# Patient Record
Sex: Female | Born: 1955 | Race: Black or African American | Hispanic: No | Marital: Single | Smoking: Never smoker
Health system: Southern US, Community
[De-identification: ages and names within clinical notes are randomized; demographics above are authoritative.]

## PROBLEM LIST (undated history)

## (undated) ENCOUNTER — Emergency Department (HOSPITAL_COMMUNITY): Admission: EM | Payer: Medicare HMO | Source: Home / Self Care

## (undated) DIAGNOSIS — E119 Type 2 diabetes mellitus without complications: Secondary | ICD-10-CM

## (undated) DIAGNOSIS — Z9889 Other specified postprocedural states: Secondary | ICD-10-CM

## (undated) DIAGNOSIS — M24661 Ankylosis, right knee: Secondary | ICD-10-CM

## (undated) DIAGNOSIS — M792 Neuralgia and neuritis, unspecified: Secondary | ICD-10-CM

## (undated) DIAGNOSIS — M1990 Unspecified osteoarthritis, unspecified site: Secondary | ICD-10-CM

## (undated) DIAGNOSIS — K08109 Complete loss of teeth, unspecified cause, unspecified class: Secondary | ICD-10-CM

## (undated) DIAGNOSIS — R112 Nausea with vomiting, unspecified: Secondary | ICD-10-CM

## (undated) DIAGNOSIS — I10 Essential (primary) hypertension: Secondary | ICD-10-CM

## (undated) DIAGNOSIS — Z9114 Patient's other noncompliance with medication regimen: Secondary | ICD-10-CM

## (undated) DIAGNOSIS — G629 Polyneuropathy, unspecified: Secondary | ICD-10-CM

## (undated) DIAGNOSIS — Z91148 Patient's other noncompliance with medication regimen for other reason: Secondary | ICD-10-CM

## (undated) DIAGNOSIS — E785 Hyperlipidemia, unspecified: Secondary | ICD-10-CM

## (undated) DIAGNOSIS — G8929 Other chronic pain: Secondary | ICD-10-CM

## (undated) DIAGNOSIS — Z9289 Personal history of other medical treatment: Secondary | ICD-10-CM

## (undated) HISTORY — DX: Patient's other noncompliance with medication regimen: Z91.14

## (undated) HISTORY — PX: TUBAL LIGATION: SHX77

## (undated) HISTORY — DX: Patient's other noncompliance with medication regimen for other reason: Z91.148

## (undated) HISTORY — PX: ROTATOR CUFF REPAIR: SHX139

## (undated) HISTORY — DX: Essential (primary) hypertension: I10

---

## 1998-02-04 ENCOUNTER — Encounter: Admission: RE | Admit: 1998-02-04 | Discharge: 1998-05-05 | Payer: Self-pay | Admitting: *Deleted

## 1998-02-06 ENCOUNTER — Other Ambulatory Visit: Admission: RE | Admit: 1998-02-06 | Discharge: 1998-02-06 | Payer: Self-pay | Admitting: Internal Medicine

## 1998-03-09 ENCOUNTER — Other Ambulatory Visit: Admission: RE | Admit: 1998-03-09 | Discharge: 1998-03-09 | Payer: Self-pay

## 1998-09-24 ENCOUNTER — Encounter: Payer: Self-pay | Admitting: Orthopedic Surgery

## 1998-09-28 ENCOUNTER — Observation Stay (HOSPITAL_COMMUNITY): Admission: RE | Admit: 1998-09-28 | Discharge: 1998-09-29 | Payer: Self-pay | Admitting: Orthopedic Surgery

## 1999-03-03 ENCOUNTER — Other Ambulatory Visit: Admission: RE | Admit: 1999-03-03 | Discharge: 1999-03-03 | Payer: Self-pay | Admitting: Internal Medicine

## 1999-04-07 ENCOUNTER — Encounter: Payer: Self-pay | Admitting: Internal Medicine

## 1999-04-07 ENCOUNTER — Ambulatory Visit (HOSPITAL_COMMUNITY): Admission: RE | Admit: 1999-04-07 | Discharge: 1999-04-07 | Payer: Self-pay | Admitting: Internal Medicine

## 2002-02-26 ENCOUNTER — Ambulatory Visit (HOSPITAL_COMMUNITY): Admission: RE | Admit: 2002-02-26 | Discharge: 2002-02-26 | Payer: Self-pay | Admitting: Gastroenterology

## 2002-02-26 ENCOUNTER — Encounter: Payer: Self-pay | Admitting: Gastroenterology

## 2002-03-08 ENCOUNTER — Ambulatory Visit (HOSPITAL_COMMUNITY): Admission: RE | Admit: 2002-03-08 | Discharge: 2002-03-08 | Payer: Self-pay | Admitting: Gastroenterology

## 2002-03-08 ENCOUNTER — Encounter: Payer: Self-pay | Admitting: Gastroenterology

## 2004-08-06 ENCOUNTER — Other Ambulatory Visit: Admission: RE | Admit: 2004-08-06 | Discharge: 2004-08-06 | Payer: Self-pay | Admitting: Obstetrics and Gynecology

## 2010-07-23 ENCOUNTER — Ambulatory Visit: Payer: Self-pay | Admitting: Cardiology

## 2010-07-29 ENCOUNTER — Encounter: Payer: Self-pay | Admitting: Cardiology

## 2010-07-29 DIAGNOSIS — I10 Essential (primary) hypertension: Secondary | ICD-10-CM

## 2010-07-30 ENCOUNTER — Encounter: Payer: Self-pay | Admitting: Cardiology

## 2010-07-30 ENCOUNTER — Ambulatory Visit: Payer: Self-pay

## 2010-08-27 ENCOUNTER — Ambulatory Visit: Payer: Self-pay | Admitting: Cardiology

## 2010-09-30 ENCOUNTER — Ambulatory Visit: Payer: Self-pay | Admitting: Cardiology

## 2010-11-23 NOTE — Miscellaneous (Signed)
Summary: Orders Update  Clinical Lists Changes  Problems: Added new problem of UNSPECIFIED ESSENTIAL HYPERTENSION (ICD-401.9) Orders: Added new Test order of Renal Artery Duplex (Renal Artery Duplex) - Signed

## 2011-01-21 ENCOUNTER — Ambulatory Visit: Payer: Self-pay | Admitting: Cardiology

## 2011-01-27 ENCOUNTER — Encounter: Payer: Self-pay | Admitting: Nurse Practitioner

## 2011-02-01 ENCOUNTER — Encounter: Payer: Self-pay | Admitting: Nurse Practitioner

## 2011-02-01 ENCOUNTER — Ambulatory Visit (INDEPENDENT_AMBULATORY_CARE_PROVIDER_SITE_OTHER): Payer: Managed Care, Other (non HMO) | Admitting: Nurse Practitioner

## 2011-02-01 VITALS — BP 170/100 | HR 60 | Ht 63.0 in | Wt 157.8 lb

## 2011-02-01 DIAGNOSIS — I10 Essential (primary) hypertension: Secondary | ICD-10-CM

## 2011-02-01 DIAGNOSIS — Z91148 Patient's other noncompliance with medication regimen for other reason: Secondary | ICD-10-CM | POA: Insufficient documentation

## 2011-02-01 DIAGNOSIS — E1159 Type 2 diabetes mellitus with other circulatory complications: Secondary | ICD-10-CM | POA: Insufficient documentation

## 2011-02-01 DIAGNOSIS — Z9119 Patient's noncompliance with other medical treatment and regimen: Secondary | ICD-10-CM

## 2011-02-01 DIAGNOSIS — I152 Hypertension secondary to endocrine disorders: Secondary | ICD-10-CM | POA: Insufficient documentation

## 2011-02-01 DIAGNOSIS — Z9114 Patient's other noncompliance with medication regimen: Secondary | ICD-10-CM

## 2011-02-01 MED ORDER — CARVEDILOL 6.25 MG PO TABS: 6.2500 mg | ORAL_TABLET | Freq: Two times a day (BID) | ORAL | Status: DC

## 2011-02-01 MED ORDER — HYDRALAZINE HCL 25 MG PO TABS: 25.0000 mg | ORAL_TABLET | Freq: Two times a day (BID) | ORAL | Status: DC

## 2011-02-01 NOTE — Patient Instructions (Signed)
Stop the Bystolic Stop the Clonidine  Start Coreg 6.25mg  2 x a day Stay on Hydralazine 25mg  2 x a day Stay on Tribenzor 2 x a day See me Tuesday for a visit. Watch your salt.

## 2011-02-01 NOTE — Progress Notes (Signed)
History of Present Illness: Lori Bennett is seen today for her follow up visit. She is seen for Dr. Swaziland. She has been out of her Bystolic and hydralazine. She cannot afford the medicines with her current drug coverage. Blood pressure is grossly elevated today. She is fairly asymptomatic. Her eyes are always a little blurry. She denies headache. She is not having chest pain.   Current Outpatient Prescriptions on File Prior to Visit  Medication Sig Dispense Refill  . Olmesartan-Amlodipine-HCTZ (TRIBENZOR) 40-5-12.5 MG TABS Take by mouth 2 (two) times daily.       Marland Kitchen DISCONTD: nebivolol (BYSTOLIC) 10 MG tablet Take 10 mg by mouth daily.        . carvedilol (COREG) 6.25 MG tablet Take 1 tablet (6.25 mg total) by mouth 2 (two) times daily.  60 tablet  11  . DISCONTD: cloNIDine (CATAPRES) 0.1 MG tablet Take 0.1 mg by mouth daily.        Marland Kitchen DISCONTD: hydrALAZINE (APRESOLINE) 25 MG tablet Take 25 mg by mouth 2 (two) times daily.          Allergies  Allergen Reactions  . Ace Inhibitors Cough  . Clonidine Derivatives Other (See Comments)    Severe dry mouth and lethargy.    Past Medical History  Diagnosis Date  . HTN (hypertension)   . Noncompliance with medications     Past Surgical History  Procedure Date  . Rotator cuff repair   . Tubal ligation     History  Smoking status  . Never Smoker   Smokeless tobacco  . Never Used    History  Alcohol Use No    Family History  Problem Relation Age of Onset  . Hypertension Mother     Review of Systems: The review of systems is positive for blurred vision. She is not short of breath. She tries to watch her salt.  All other systems were reviewed and are negative.  Physical Exam: BP 170/100  Pulse 60  Ht 5\' 3"  (1.6 m)  Wt 157 lb 12.8 oz (71.578 kg)  BMI 27.95 kg/m2 Patient is very pleasant and in no acute distress. Skin is warm and dry. Color is normal.  HEENT is remarkable for poor dentition. Normocephalic/atraumatic. PERRL. Sclera are  nonicteric. Neck is supple. No masses. No JVD. Lungs are clear. Cardiac exam shows a regular rate and rhythm. She has a positive S4. Abdomen is soft. Extremities are without edema. Gait and ROM are intact. No gross neurologic deficits noted.    Assessment / Plan:

## 2011-02-01 NOTE — Assessment & Plan Note (Addendum)
Blood pressure is quite elevated today. She has been out of the Bystolic and Hydralazine for quite some time. She does not tolerate the clonidine. Cost is the significant factor with her current drug coverage. We have samples available but I do not think that is a good long term solution for her and she agrees. She states she can afford $4 drugs at [facility]. I have switched her over to Coreg 6.25mg  BID. The hydralazine is $4 as well at [facility]. We will restart it at 25mg  BID.  New prescriptions are eprescribed to the [facility] on [address]. I will see her back on Tuesday. We will need to find an alternative for the Tribenzor but samples are given of the Tribenzor for her today.

## 2011-02-07 ENCOUNTER — Encounter: Payer: Self-pay | Admitting: *Deleted

## 2011-02-09 ENCOUNTER — Ambulatory Visit (INDEPENDENT_AMBULATORY_CARE_PROVIDER_SITE_OTHER): Payer: Managed Care, Other (non HMO) | Admitting: Nurse Practitioner

## 2011-02-09 ENCOUNTER — Encounter: Payer: Self-pay | Admitting: Nurse Practitioner

## 2011-02-09 VITALS — BP 134/86 | HR 64 | Wt 159.0 lb

## 2011-02-09 DIAGNOSIS — Z9119 Patient's noncompliance with other medical treatment and regimen: Secondary | ICD-10-CM

## 2011-02-09 DIAGNOSIS — Z91148 Patient's other noncompliance with medication regimen for other reason: Secondary | ICD-10-CM

## 2011-02-09 DIAGNOSIS — I10 Essential (primary) hypertension: Secondary | ICD-10-CM

## 2011-02-09 DIAGNOSIS — Z9114 Patient's other noncompliance with medication regimen: Secondary | ICD-10-CM

## 2011-02-09 NOTE — Patient Instructions (Signed)
Continue with your current medicines. Monitor your blood pressure at home.  Record your readings and bring to your next visit. Limit sodium intake. Call for any problems.  

## 2011-02-09 NOTE — Assessment & Plan Note (Signed)
Blood pressure is much better on this current regimen. This is a regimen that she can afford. She has a copay card to offset the cost for the Tribenzor. Samples of Tribenzor are given as well. I will see her back in about 3 months. She will continue to monitor her blood pressure at home. She will call for any problems in the interim.

## 2011-02-09 NOTE — Progress Notes (Signed)
   Lori Bennett Date of Birth: Jun 02, 1956   History of Present Illness: Lori Bennett is seen back today for a follow up visit. She is seen for Dr. Swaziland. She was not able to afford her medicines. She was switched to generic Coreg and hydralazine because they are only $4.00. Her blood pressure at home is much better. It is better here today as well. She is feeling better. She is not having chest pain. She is now on a regimen that she can afford.   Current Outpatient Prescriptions on File Prior to Visit  Medication Sig Dispense Refill  . carvedilol (COREG) 6.25 MG tablet Take 1 tablet (6.25 mg total) by mouth 2 (two) times daily.  60 tablet  11  . hydrALAZINE (APRESOLINE) 25 MG tablet Take 1 tablet (25 mg total) by mouth 2 (two) times daily.  60 tablet  6  . Olmesartan-Amlodipine-HCTZ (TRIBENZOR) 40-5-12.5 MG TABS Take by mouth 2 (two) times daily.         Allergies  Allergen Reactions  . Ace Inhibitors Cough  . Clonidine Derivatives Other (See Comments)    Severe dry mouth and lethargy.    Past Medical History  Diagnosis Date  . HTN (hypertension)   . Noncompliance with medications     Past Surgical History  Procedure Date  . Rotator cuff repair   . Tubal ligation     History  Smoking status  . Never Smoker   Smokeless tobacco  . Never Used    History  Alcohol Use No    Family History  Problem Relation Age of Onset  . Hypertension Mother     Review of Systems: The review of systems is as above.  All other systems were reviewed and are negative.  Physical Exam: BP 134/86  Pulse 64  Wt 159 lb (72.122 kg) Patient is very pleasant and in no acute distress. Skin is warm and dry. Color is normal.  HEENT is unremarkable. Normocephalic/atraumatic. PERRL. Sclera are nonicteric. Neck is supple. No masses. No JVD. Lungs are clear. Cardiac exam shows a regular rate and rhythm. Abdomen is soft. Extremities are without edema. Gait and ROM are intact. No gross neurologic deficits  noted.  LABORATORY DATA:  N/A   Assessment / Plan:

## 2011-02-09 NOTE — Assessment & Plan Note (Signed)
She is currently on a regimen that is affordable. We will continue with this current regimen.

## 2011-05-13 ENCOUNTER — Ambulatory Visit (INDEPENDENT_AMBULATORY_CARE_PROVIDER_SITE_OTHER): Payer: Managed Care, Other (non HMO) | Admitting: Nurse Practitioner

## 2011-05-13 ENCOUNTER — Encounter: Payer: Self-pay | Admitting: Nurse Practitioner

## 2011-05-13 VITALS — BP 180/108 | HR 70 | Ht 62.0 in | Wt 154.0 lb

## 2011-05-13 DIAGNOSIS — I10 Essential (primary) hypertension: Secondary | ICD-10-CM

## 2011-05-13 MED ORDER — OLMESARTAN-AMLODIPINE-HCTZ 40-5-12.5 MG PO TABS: ORAL_TABLET | ORAL | Status: DC

## 2011-05-13 NOTE — Assessment & Plan Note (Signed)
Blood pressure is grossly elevated. She has been out of the Tribenzor for one month. We will restart. Samples, co-pay card and prescription are given today. She will continue with her other medicines. I will see her back in one month to recheck her. Compliance is strongly encouraged. Patient is agreeable to this plan and will call if any problems develop in the interim.

## 2011-05-13 NOTE — Patient Instructions (Signed)
Get back on Tribenzor twice a day I need to see you in about a month Monitor your blood pressure at home Call for samples or refills if you run out of your medicines. Our goal is to not run out of any of your medicines. Call for any problems

## 2011-05-13 NOTE — Progress Notes (Signed)
    Lori Bennett Date of Birth: 1956/01/01   History of Present Illness: Lori Bennett is seen today for her 3 month follow up visit. She is seen for Dr. Swaziland. She has significant HTN. Unfortunately, she has been out of her Tribenzor for the past month. She did not take it to get refilled. Blood pressure is back up. She feels ok. No chest pain.   Current Outpatient Prescriptions on File Prior to Visit  Medication Sig Dispense Refill  . carvedilol (COREG) 6.25 MG tablet Take 1 tablet (6.25 mg total) by mouth 2 (two) times daily.  60 tablet  11  . hydrALAZINE (APRESOLINE) 25 MG tablet Take 1 tablet (25 mg total) by mouth 2 (two) times daily.  60 tablet  6    Allergies  Allergen Reactions  . Ace Inhibitors Cough  . Clonidine Derivatives Other (See Comments)    Severe dry mouth and lethargy.    Past Medical History  Diagnosis Date  . HTN (hypertension)   . Noncompliance with medications     Past Surgical History  Procedure Date  . Rotator cuff repair   . Tubal ligation     History  Smoking status  . Never Smoker   Smokeless tobacco  . Never Used    History  Alcohol Use No    Family History  Problem Relation Age of Onset  . Hypertension Mother     Review of Systems: The review of systems is positive for elevated blood pressure.  All other systems were reviewed and are negative.  Physical Exam: BP 180/108  Pulse 70  Ht 5\' 2"  (1.575 m)  Wt 154 lb (69.854 kg)  BMI 28.17 kg/m2 Patient is very pleasant and in no acute distress. Skin is warm and dry. Color is normal.  HEENT is unremarkable. Normocephalic/atraumatic. PERRL. Sclera are nonicteric. Neck is supple. No masses. No JVD. Lungs are clear. Cardiac exam shows a regular rate and rhythm. Abdomen is soft. Extremities are without edema. Gait and ROM are intact. No gross neurologic deficits noted.  LABORATORY DATA:   Assessment / Plan:

## 2011-06-17 ENCOUNTER — Encounter: Payer: Self-pay | Admitting: Nurse Practitioner

## 2011-06-17 ENCOUNTER — Ambulatory Visit (INDEPENDENT_AMBULATORY_CARE_PROVIDER_SITE_OTHER): Payer: Managed Care, Other (non HMO) | Admitting: Nurse Practitioner

## 2011-06-17 VITALS — BP 130/80 | Ht 63.0 in | Wt 154.4 lb

## 2011-06-17 DIAGNOSIS — I10 Essential (primary) hypertension: Secondary | ICD-10-CM

## 2011-06-17 NOTE — Patient Instructions (Signed)
Stay on your medicines Call us if you need samples I will have you see Dr. Swaziland in 6 months.

## 2011-06-17 NOTE — Assessment & Plan Note (Signed)
Blood pressure is better. I have left her on her current regimen. I have encouraged her to stay compliant. She is on an affordable regimen at this time with the co pay cards. We will see her back in 6 months. Patient is agreeable to this plan and will call if any problems develop in the interim.

## 2011-06-17 NOTE — Progress Notes (Signed)
    Lori Bennett Date of Birth: 14-Oct-1956   History of Present Illness: Lori Bennett is seen today for a follow up visit. She is seen back for Dr. Swaziland. She is back on her medicines. She feels good. No complaints.  Current Outpatient Prescriptions on File Prior to Visit  Medication Sig Dispense Refill  . carvedilol (COREG) 6.25 MG tablet Take 1 tablet (6.25 mg total) by mouth 2 (two) times daily.  60 tablet  11  . hydrALAZINE (APRESOLINE) 25 MG tablet Take 1 tablet (25 mg total) by mouth 2 (two) times daily.  60 tablet  6  . Olmesartan-Amlodipine-HCTZ (TRIBENZOR) 40-5-12.5 MG TABS Take BID  60 tablet  6    Allergies  Allergen Reactions  . Ace Inhibitors Cough  . Clonidine Derivatives Other (See Comments)    Severe dry mouth and lethargy.    Past Medical History  Diagnosis Date  . HTN (hypertension)   . Noncompliance with medications     Past Surgical History  Procedure Date  . Rotator cuff repair   . Tubal ligation     History  Smoking status  . Never Smoker   Smokeless tobacco  . Never Used    History  Alcohol Use No    Family History  Problem Relation Age of Onset  . Hypertension Mother     Review of Systems: The review of systems is as above.  All other systems were reviewed and are negative.  Physical Exam: BP 130/80  Ht 5\' 3"  (1.6 m)  Wt 154 lb 6.4 oz (70.035 kg)  BMI 27.35 kg/m2 Patient is very pleasant and in no acute distress. Skin is warm and dry. Color is normal.  HEENT is unremarkable. Normocephalic/atraumatic. PERRL. Sclera are nonicteric. Neck is supple. No masses. No JVD. Lungs are clear. Cardiac exam shows a regular rate and rhythm. Abdomen is soft. Extremities are without edema. Gait and ROM are intact. No gross neurologic deficits noted.  LABORATORY DATA:   Assessment / Plan:

## 2011-12-15 ENCOUNTER — Ambulatory Visit (INDEPENDENT_AMBULATORY_CARE_PROVIDER_SITE_OTHER): Payer: Managed Care, Other (non HMO) | Admitting: Cardiology

## 2011-12-15 ENCOUNTER — Encounter: Payer: Self-pay | Admitting: Cardiology

## 2011-12-15 VITALS — BP 130/78 | HR 69 | Ht 63.0 in | Wt 158.6 lb

## 2011-12-15 DIAGNOSIS — I10 Essential (primary) hypertension: Secondary | ICD-10-CM

## 2011-12-15 MED ORDER — LOSARTAN POTASSIUM-HCTZ 50-12.5 MG PO TABS: 1.0000 | ORAL_TABLET | Freq: Two times a day (BID) | ORAL | Status: DC

## 2011-12-15 MED ORDER — AMLODIPINE BESYLATE 5 MG PO TABS: 5.0000 mg | ORAL_TABLET | Freq: Two times a day (BID) | ORAL | Status: DC

## 2011-12-15 NOTE — Patient Instructions (Signed)
We will switch your Tribenzor to amlodipine 5 mg twice a day and Hyzaar (losartan HCT) 50/12.5 mg twice a day.  Monitor your blood pressure and let me know if you see a change.  We will check your kidney function today.

## 2011-12-15 NOTE — Assessment & Plan Note (Signed)
Her blood pressure is under excellent control on current medications. Given her difficulty affording theTribenzor we will switch her to Hyzaar 50/12.5 mg twice daily and amlodipine 5 mg twice daily. She will continue with her carvedilol and hydralazine. She is going to monitor her blood pressure closely. We will check a basic metabolic panel today. Although she has changes on her ECG I suspect this is related to her chronic hypertensive heart disease. She is asymptomatic.

## 2011-12-15 NOTE — Progress Notes (Signed)
    Lori Bennett Date of Birth: [birth year]   History of Present Illness: Lori Bennett is seen today for a follow up visit. She states she has been feeling well. She denies any chest pain or dyspnea. She has been tolerating her medications well. She states she is no longer able to afford the Tribenzor since even with a savings card it  costs $140 a month.  Current Outpatient Prescriptions on File Prior to Visit  Medication Sig Dispense Refill  . carvedilol (COREG) 6.25 MG tablet Take 1 tablet (6.25 mg total) by mouth 2 (two) times daily.  60 tablet  11  . hydrALAZINE (APRESOLINE) 25 MG tablet Take 1 tablet (25 mg total) by mouth 2 (two) times daily.  60 tablet  6    Allergies  Allergen Reactions  . Ace Inhibitors Cough  . Clonidine Derivatives Other (See Comments)    Severe dry mouth and lethargy.    Past Medical History  Diagnosis Date  . HTN (hypertension)   . Noncompliance with medications     Past Surgical History  Procedure Date  . Rotator cuff repair   . Tubal ligation     History  Smoking status  . Never Smoker   Smokeless tobacco  . Never Used    History  Alcohol Use No    Family History  Problem Relation Age of Onset  . Hypertension Mother     Review of Systems: The review of systems is as above.  All other systems were reviewed and are negative.  Physical Exam: BP 130/78  Pulse 69  Ht 5\' 3"  (1.6 m)  Wt 71.94 kg (158 lb 9.6 oz)  BMI 28.09 kg/m2 Patient is very pleasant and in no acute distress. Skin is warm and dry. Color is normal.  HEENT is unremarkable. Normocephalic/atraumatic. PERRL. Sclera are nonicteric. Neck is supple. No masses. No JVD. Lungs are clear. Cardiac exam shows a regular rate and rhythm. Abdomen is soft. Extremities are without edema. Gait and ROM are intact. No gross neurologic deficits noted.  LABORATORY DATA: ECG today demonstrates normal sinus rhythm. She has ST-T wave changes consistent with inferior and anterolateral ischemia.  Compared to prior ECGs and 2010 these changes are more prominent.  Assessment / Plan:

## 2011-12-16 ENCOUNTER — Other Ambulatory Visit: Payer: Self-pay

## 2011-12-16 ENCOUNTER — Telehealth: Payer: Self-pay | Admitting: Cardiology

## 2011-12-16 ENCOUNTER — Telehealth: Payer: Self-pay

## 2011-12-16 DIAGNOSIS — I10 Essential (primary) hypertension: Secondary | ICD-10-CM

## 2011-12-16 LAB — BASIC METABOLIC PANEL
BUN: 13 mg/dL (ref 6–23)
Calcium: 9.7 mg/dL (ref 8.4–10.5)
Chloride: 103 mEq/L (ref 96–112)
Creatinine, Ser: 0.7 mg/dL (ref 0.4–1.2)
GFR: 109.51 mL/min (ref >60.00)

## 2011-12-16 MED ORDER — POTASSIUM CHLORIDE CRYS ER 20 MEQ PO TBCR: 20.0000 meq | EXTENDED_RELEASE_TABLET | Freq: Two times a day (BID) | ORAL | Status: DC

## 2011-12-16 NOTE — Telephone Encounter (Signed)
Patient was called no answer at home. Left message to call back today.Work # was called left message for patient to call back today reguarding lab work.

## 2011-12-16 NOTE — Telephone Encounter (Signed)
Patient called was told potassum low at 2.8.Spoke to Norma Fredrickson NP advised to start kdur twice daily.Repeat bmet in 1 week.

## 2011-12-16 NOTE — Telephone Encounter (Signed)
Pt rtn call to [place] °

## 2011-12-19 ENCOUNTER — Other Ambulatory Visit: Payer: Self-pay

## 2011-12-19 DIAGNOSIS — I10 Essential (primary) hypertension: Secondary | ICD-10-CM

## 2011-12-23 ENCOUNTER — Other Ambulatory Visit (INDEPENDENT_AMBULATORY_CARE_PROVIDER_SITE_OTHER): Payer: Managed Care, Other (non HMO)

## 2011-12-23 ENCOUNTER — Other Ambulatory Visit: Payer: Self-pay

## 2011-12-23 DIAGNOSIS — I10 Essential (primary) hypertension: Secondary | ICD-10-CM

## 2011-12-23 LAB — BASIC METABOLIC PANEL
BUN: 15 mg/dL (ref 6–23)
CO2: 27 mEq/L (ref 19–32)
Calcium: 9.3 mg/dL (ref 8.4–10.5)
Chloride: 104 mEq/L (ref 96–112)
Creatinine, Ser: 0.7 mg/dL (ref 0.4–1.2)
GFR: 113.18 mL/min (ref >60.00)
Glucose, Bld: 116 mg/dL — ABNORMAL HIGH (ref 70–99)
Potassium: 3.4 mEq/L — ABNORMAL LOW (ref 3.5–5.1)
Sodium: 140 mEq/L (ref 135–145)

## 2012-01-09 ENCOUNTER — Other Ambulatory Visit (INDEPENDENT_AMBULATORY_CARE_PROVIDER_SITE_OTHER): Payer: Managed Care, Other (non HMO)

## 2012-01-09 DIAGNOSIS — I10 Essential (primary) hypertension: Secondary | ICD-10-CM

## 2012-01-09 LAB — BASIC METABOLIC PANEL
BUN: 10 mg/dL (ref 6–23)
Calcium: 9.4 mg/dL (ref 8.4–10.5)
Creatinine, Ser: 0.7 mg/dL (ref 0.4–1.2)
GFR: 117.07 mL/min (ref >60.00)
Glucose, Bld: 104 mg/dL — ABNORMAL HIGH (ref 70–99)
Potassium: 3.3 mEq/L — ABNORMAL LOW (ref 3.5–5.1)

## 2012-01-10 ENCOUNTER — Encounter: Payer: Self-pay | Admitting: *Deleted

## 2012-01-10 ENCOUNTER — Telehealth: Payer: Self-pay | Admitting: Cardiology

## 2012-01-10 NOTE — Telephone Encounter (Signed)
FU Call: Pt returning call to our office. Please return pt call to discuss further.  

## 2012-01-10 NOTE — Telephone Encounter (Signed)
Patient called no answer.LMTC. 

## 2012-01-10 NOTE — Telephone Encounter (Signed)
Patient returning nurse call, she can be reached at [phone]

## 2012-01-10 NOTE — Telephone Encounter (Signed)
Will forward to [place]

## 2012-01-11 ENCOUNTER — Other Ambulatory Visit: Payer: Self-pay

## 2012-01-11 DIAGNOSIS — I10 Essential (primary) hypertension: Secondary | ICD-10-CM

## 2012-01-11 NOTE — Telephone Encounter (Signed)
Patient called no answer.LMTC. 

## 2012-01-12 NOTE — Telephone Encounter (Signed)
Patient called, lab results given.Patient stated she was already taking kdur 20 meq 3 tablets.Spoke to Norma Fredrickson NP she advised to increase to 4 tablets of kdur  and repeat bmet 4 weeks.

## 2012-01-13 ENCOUNTER — Ambulatory Visit: Payer: Managed Care, Other (non HMO) | Admitting: Cardiology

## 2012-01-20 ENCOUNTER — Other Ambulatory Visit: Payer: Self-pay | Admitting: Nurse Practitioner

## 2012-02-10 ENCOUNTER — Telehealth: Payer: Self-pay | Admitting: *Deleted

## 2012-02-10 ENCOUNTER — Other Ambulatory Visit (INDEPENDENT_AMBULATORY_CARE_PROVIDER_SITE_OTHER): Payer: Managed Care, Other (non HMO)

## 2012-02-10 DIAGNOSIS — I10 Essential (primary) hypertension: Secondary | ICD-10-CM

## 2012-02-10 DIAGNOSIS — E876 Hypokalemia: Secondary | ICD-10-CM

## 2012-02-10 LAB — BASIC METABOLIC PANEL
BUN: 7 mg/dL (ref 6–23)
CO2: 26 mEq/L (ref 19–32)
Calcium: 8.7 mg/dL (ref 8.4–10.5)
Chloride: 107 mEq/L (ref 96–112)
Creatinine, Ser: 0.5 mg/dL (ref 0.4–1.2)
GFR: 150.11 mL/min (ref >60.00)
Glucose, Bld: 125 mg/dL — ABNORMAL HIGH (ref 70–99)
Potassium: 3 mEq/L — ABNORMAL LOW (ref 3.5–5.1)
Sodium: 141 mEq/L (ref 135–145)

## 2012-02-10 MED ORDER — POTASSIUM CHLORIDE CRYS ER 20 MEQ PO TBCR: 20.0000 meq | EXTENDED_RELEASE_TABLET | Freq: Two times a day (BID) | ORAL | Status: DC

## 2012-02-10 NOTE — Telephone Encounter (Signed)
Spoke with Dr Antoine Poche and he wanted patient to take an extra 80 meq of potassium today and go back to 20 meq twice daily.  Called patient and she has actually been out of potassium for about a week.  Advised to take 80 meq today, resume 20 meq twice a day and recheck bmet next week.  Verbalized understanding.  Did state her mother was not doing well a may pass away soon.

## 2012-02-15 ENCOUNTER — Other Ambulatory Visit: Payer: Managed Care, Other (non HMO)

## 2012-02-17 ENCOUNTER — Other Ambulatory Visit (INDEPENDENT_AMBULATORY_CARE_PROVIDER_SITE_OTHER): Payer: Managed Care, Other (non HMO)

## 2012-02-17 ENCOUNTER — Telehealth: Payer: Self-pay | Admitting: *Deleted

## 2012-02-17 DIAGNOSIS — E876 Hypokalemia: Secondary | ICD-10-CM

## 2012-02-17 LAB — BASIC METABOLIC PANEL
BUN: 8 mg/dL (ref 6–23)
CO2: 26 mEq/L (ref 19–32)
Calcium: 9 mg/dL (ref 8.4–10.5)
Chloride: 105 mEq/L (ref 96–112)
Creatinine, Ser: 0.6 mg/dL (ref 0.4–1.2)
GFR: 132.91 mL/min (ref >60.00)
Glucose, Bld: 109 mg/dL — ABNORMAL HIGH (ref 70–99)
Potassium: 3.4 mEq/L — ABNORMAL LOW (ref 3.5–5.1)
Sodium: 140 mEq/L (ref 135–145)

## 2012-02-17 NOTE — Telephone Encounter (Signed)
Pt aware of lab results and recommendations. She will increase potassium to qd. Return on 03/21/12 for bmp. Mylo Red RN

## 2012-02-17 NOTE — Telephone Encounter (Signed)
Message copied by Barrie Folk on Fri Feb 17, 2012  2:43 PM ------      Message from: Rosalio Macadamia      Created: Fri Feb 17, 2012 12:58 PM       Please report. Needs to increase her potassium to a total of 60 meq daily. Recheck BMET in one month. Patient of Dr. Elvis Coil.

## 2012-03-21 ENCOUNTER — Other Ambulatory Visit (INDEPENDENT_AMBULATORY_CARE_PROVIDER_SITE_OTHER): Payer: Managed Care, Other (non HMO)

## 2012-03-21 ENCOUNTER — Telehealth: Payer: Self-pay | Admitting: Cardiology

## 2012-03-21 ENCOUNTER — Telehealth: Payer: Self-pay

## 2012-03-21 DIAGNOSIS — E876 Hypokalemia: Secondary | ICD-10-CM

## 2012-03-21 LAB — BASIC METABOLIC PANEL
BUN: 16 mg/dL (ref 6–23)
CO2: 28 mEq/L (ref 19–32)
Calcium: 9.4 mg/dL (ref 8.4–10.5)
Chloride: 103 mEq/L (ref 96–112)
Creatinine, Ser: 0.8 mg/dL (ref 0.4–1.2)
GFR: 101.15 mL/min (ref >60.00)
Glucose, Bld: 135 mg/dL — ABNORMAL HIGH (ref 70–99)
Potassium: 3 mEq/L — ABNORMAL LOW (ref 3.5–5.1)
Sodium: 140 mEq/L (ref 135–145)

## 2012-03-21 MED ORDER — CARVEDILOL 6.25 MG PO TABS: 6.2500 mg | ORAL_TABLET | Freq: Two times a day (BID) | ORAL | Status: DC

## 2012-03-21 NOTE — Telephone Encounter (Signed)
Walk In pt Form " Pt Needs Refill" sent to [name] 03/21/12/Km

## 2012-03-21 NOTE — Telephone Encounter (Signed)
Patient walked in office requesting refill on carvedilol.Carvedilol 6.25 mg twice daily sent to [facility] at [address].

## 2012-03-22 ENCOUNTER — Other Ambulatory Visit: Payer: Self-pay

## 2012-03-22 ENCOUNTER — Telehealth: Payer: Self-pay | Admitting: Cardiology

## 2012-03-22 DIAGNOSIS — I10 Essential (primary) hypertension: Secondary | ICD-10-CM

## 2012-03-22 MED ORDER — POTASSIUM CHLORIDE CRYS ER 20 MEQ PO TBCR: EXTENDED_RELEASE_TABLET | ORAL | Status: DC

## 2012-03-22 NOTE — Telephone Encounter (Signed)
Fu call °Patient returning your call °

## 2012-03-23 NOTE — Telephone Encounter (Signed)
Already spoke to patient about lab work that revealed low potassium.Patient to increase potassium to 40 meq twice daily.Repeat bmet in 2 weeks,

## 2012-04-06 ENCOUNTER — Ambulatory Visit (INDEPENDENT_AMBULATORY_CARE_PROVIDER_SITE_OTHER): Payer: Managed Care, Other (non HMO) | Admitting: *Deleted

## 2012-04-06 DIAGNOSIS — I10 Essential (primary) hypertension: Secondary | ICD-10-CM

## 2012-04-07 LAB — BASIC METABOLIC PANEL
Calcium: 9.4 mg/dL (ref 8.4–10.5)
Sodium: 140 mEq/L (ref 135–145)

## 2012-05-19 ENCOUNTER — Ambulatory Visit: Payer: Managed Care, Other (non HMO)

## 2012-05-19 ENCOUNTER — Ambulatory Visit (INDEPENDENT_AMBULATORY_CARE_PROVIDER_SITE_OTHER): Payer: Managed Care, Other (non HMO) | Admitting: Family Medicine

## 2012-05-19 VITALS — BP 150/90 | HR 66 | Temp 97.4°F | Resp 18 | Ht 63.5 in | Wt 153.0 lb

## 2012-05-19 DIAGNOSIS — M25559 Pain in unspecified hip: Secondary | ICD-10-CM

## 2012-05-19 DIAGNOSIS — M25551 Pain in right hip: Secondary | ICD-10-CM

## 2012-05-19 LAB — CK: Total CK: 253 U/L — ABNORMAL HIGH (ref 7–177)

## 2012-05-19 MED ORDER — TRAMADOL HCL 50 MG PO TABS: 50.0000 mg | ORAL_TABLET | Freq: Three times a day (TID) | ORAL | Status: AC | PRN

## 2012-05-19 MED ORDER — CYCLOBENZAPRINE HCL 10 MG PO TABS: 10.0000 mg | ORAL_TABLET | Freq: Three times a day (TID) | ORAL | Status: AC | PRN

## 2012-05-19 NOTE — Progress Notes (Addendum)
[facility] [address], [place] [address] [id number]  Date:  05/19/2012   Name:  Lori Bennett   DOB:  11/10/1955   MRN:  [id number]  PCP:  No primary provider on file.    Chief Complaint: Hip Pain   History of Present Illness:  Lori Bennett is a 56 y.o. very pleasant female patient who presents with the following:  She has noted pain in her right hip and down her right thigh for 4 days.  [name] is very active at her job and lifts a lot. She is not able to get comfortable.  She has never had this before, no history of hip pain. The thigh is not painful to touch or squeeze, but the pain seems to run down her anterior leg.  She has a history of HTN but is otherwise generally healthy  Patient Active Problem List  Diagnosis  . UNSPECIFIED ESSENTIAL HYPERTENSION  . HTN (hypertension)  . Noncompliance with medications    Past Medical History  Diagnosis Date  . HTN (hypertension)   . Noncompliance with medications     Past Surgical History  Procedure Date  . Rotator cuff repair   . Tubal ligation     History  Substance Use Topics  . Smoking status: Never Smoker   . Smokeless tobacco: Never Used  . Alcohol Use: No    Family History  Problem Relation Age of Onset  . Hypertension Mother     Allergies  Allergen Reactions  . Ace Inhibitors Cough  . Clonidine Derivatives Other (See Comments)    Severe dry mouth and lethargy.    Medication list has been reviewed and updated.  Current Outpatient Prescriptions on File Prior to Visit  Medication Sig Dispense Refill  . amLODipine (NORVASC) 5 MG tablet Take 1 tablet (5 mg total) by mouth 2 (two) times daily.  60 tablet  11  . carvedilol (COREG) 6.25 MG tablet Take 1 tablet (6.25 mg total) by mouth 2 (two) times daily.  180 tablet  3  . hydrALAZINE (APRESOLINE) 25 MG tablet TAKE ONE TABLET BY MOUTH TWICE DAILY  60 tablet  6  . losartan-hydrochlorothiazide (HYZAAR) 50-12.5 MG per tablet Take 1  tablet by mouth 2 (two) times daily.  60 tablet  11  . carvedilol (COREG) 6.25 MG tablet Take 1 tablet (6.25 mg total) by mouth 2 (two) times daily.  60 tablet  11  . potassium chloride SA (K-DUR,KLOR-CON) 20 MEQ tablet Take 2 tablets twice daily  100 tablet  6    Review of Systems:  As per HPI- otherwise negative.   Physical Examination: Filed Vitals:   05/19/12 0823  BP: 182/100  Pulse: 66  Temp: 97.4 F (36.3 C)  Resp: 18   Filed Vitals:   05/19/12 0823  Height: 5' 3.5" (1.613 m)  Weight: 153 lb (69.4 kg)   Body mass index is 26.68 kg/(m^2). Ideal Body Weight: Weight in (lb) to have BMI = 25: 143.1   GEN: WDWN, NAD, Non-toxic, A & O x 3 HEENT: Atraumatic, Normocephalic. Neck supple. No masses, No LAD.  TM and oropharynx wnl, PEERL Ears and Nose: No external deformity. CV: RRR, No M/G/R. No JVD. No thrill. No extra heart sounds. PULM: CTA B, no wheezes, crackles, rhonchi. No retractions. No resp. distress. No accessory muscle use. ABD: S, NT, ND, +BS. No rebound. No HSM. EXTR: No c/c/e.  She has tenderness in her right hip flexor muscles.  She has pain  with resisted flexion of the hip.  However, the hip joint itself is not tender and has normal ROM.   NEURO slight limp.   Normal strength, sensation and DTR both LE.   PSYCH: Normally interactive. Conversant. Not depressed or anxious appearing.  Calm demeanor.  Right hip: normal to log roll, no pain with passive hip ROM.    [facility] reading (PRIMARY) by  Dr. Patsy Lager.  Negative right hip and negative 2V lumbar spine except increased stool volume RIGHT HIP - COMPLETE 2+ VIEW  Comparison: Preliminary reading of Dr. Patsy Lager  Findings: Two views of the right hip submitted. No acute fracture or subluxation. No radiopaque foreign body.  IMPRESSION: No acute fracture or subluxation.  LUMBAR SPINE - 2-3 VIEW  Comparison: Preliminary reading Dr. Patsy Lager  Findings: Two views of the lumbar spine submitted. No acute fracture or  subluxation. Mild anterior spurring lower endplate of the L5 vertebral body. Minimal disc space flattening at L5 S1 level. Mild anterior spurring at T11-T12 level. Mild facet degenerative changes noted L4 and L5 level.  IMPRESSION: No acute fracture or subluxation. Mild degenerative changes.  Assessment and Plan: 1. Right hip pain  CK, DG Hip Complete Right, DG Lumbar Spine 2-3 Views, cyclobenzaprine (FLEXERIL) 10 MG tablet, traMADol (ULTRAM) 50 MG tablet   Lori Bennett has right hip flexor pain and strain.  Will treat as above with muscle relaxer and pain relief.  Gave note for work in case she needs to take any days off this week. She will let met know if not feeling better within a few days- Sooner if worse.       Abbe Amsterdam, MD

## 2012-05-24 ENCOUNTER — Encounter: Payer: Self-pay | Admitting: Family Medicine

## 2012-06-15 ENCOUNTER — Ambulatory Visit (INDEPENDENT_AMBULATORY_CARE_PROVIDER_SITE_OTHER): Payer: Managed Care, Other (non HMO) | Admitting: Internal Medicine

## 2012-06-15 VITALS — BP 142/84 | HR 80 | Temp 97.8°F | Resp 14 | Ht 64.0 in | Wt 150.0 lb

## 2012-06-15 DIAGNOSIS — M25569 Pain in unspecified knee: Secondary | ICD-10-CM

## 2012-06-15 DIAGNOSIS — M25551 Pain in right hip: Secondary | ICD-10-CM

## 2012-06-15 MED ORDER — MELOXICAM 15 MG PO TABS: 15.0000 mg | ORAL_TABLET | Freq: Every day | ORAL | Status: AC

## 2012-06-15 MED ORDER — HYDROCODONE-ACETAMINOPHEN 5-500 MG PO TABS: 1.0000 | ORAL_TABLET | Freq: Every day | ORAL | Status: AC

## 2012-06-15 NOTE — Progress Notes (Signed)
  Subjective:    Patient ID: Lori Bennett, female    DOB: [birth year], 56 y.o.   MRN: [id number]  HPIpresents today recheck R hip pain x 2 weeks. Still having pain and worse when laying down. Has difficulty getting in/out of car.  No past injuries. Pain now interferes with sleep/hurts as much at rest as when active Meds prescribed by Dr. Dallas Schimke, Flexeril and tramadol have not helped her  History of knee arthroscopy 6 years ago without sequelae Review of Systems     Objective:   Physical Exam She has a fair range of motion of the hip without discomfort but does have some pain with full flexion and The pain seems to push into the groin Straight leg raise is negative for lumbar strain She is most tender along the lateral hip flexors although the trochanter to the lower part of the leg She complains of decrease sensation to light touch on the anterior lateral part of the thigh        Assessment & Plan:  Problem #1 persistent hip pain ? Etiology Plan-refer to Dr. Althea Charon Mobic 15 daily Okay for Vicodin 5 500 at bedtime Meds ordered this encounter  Medications  . meloxicam (MOBIC) 15 MG tablet    Sig: Take 1 tablet (15 mg total) by mouth daily.    Dispense:  30 tablet    Refill:  0  . HYDROcodone-acetaminophen (VICODIN) 5-500 MG per tablet    Sig: Take 1 tablet by mouth at bedtime.    Dispense:  20 tablet    Refill:  0   She will continue to work because she has to

## 2013-10-24 HISTORY — PX: COLONOSCOPY: SHX174

## 2014-12-25 ENCOUNTER — Ambulatory Visit: Payer: Managed Care, Other (non HMO) | Admitting: *Deleted

## 2014-12-25 ENCOUNTER — Encounter: Payer: Self-pay | Admitting: *Deleted

## 2014-12-25 VITALS — BP 212/116 | HR 76 | Ht 63.0 in | Wt 163.5 lb

## 2014-12-25 DIAGNOSIS — I10 Essential (primary) hypertension: Secondary | ICD-10-CM

## 2014-12-25 DIAGNOSIS — Z13220 Encounter for screening for lipoid disorders: Secondary | ICD-10-CM

## 2014-12-25 MED ORDER — LOSARTAN POTASSIUM-HCTZ 50-12.5 MG PO TABS: 1.0000 | ORAL_TABLET | Freq: Two times a day (BID) | ORAL | Status: DC

## 2014-12-25 MED ORDER — CARVEDILOL 6.25 MG PO TABS: 6.2500 mg | ORAL_TABLET | Freq: Two times a day (BID) | ORAL | Status: DC

## 2014-12-25 MED ORDER — HYDRALAZINE HCL 25 MG PO TABS: 25.0000 mg | ORAL_TABLET | Freq: Two times a day (BID) | ORAL | Status: DC

## 2014-12-25 MED ORDER — AMLODIPINE BESYLATE 5 MG PO TABS: 5.0000 mg | ORAL_TABLET | Freq: Two times a day (BID) | ORAL | Status: DC

## 2014-12-25 NOTE — Progress Notes (Signed)
Patient walked in for BP check. Patient has OV with Dr. SwazilandJordan on 01/01/15 and thought her appointment was today. She states she has been out of all 4 BP medications for 1 year r/t cost. She states she was getting samples of the medications but then could no longer obtain samples - of note, all medications are generic.   Patient states her BP had been fine but she noticed it was elevated around 200 systolic over the weekend. She had no symptoms other than some blurred vision, which she did not attribute to the BP as she states she has this on occasion.   BP measurements obtained and reviewed by DOD, Dr. SwazilandJordan. He advised that patient resume all BP medications she had been on previously and have fasting labs (lipid, CMET) prior to OV.   Patient was given medication & lab instructions. She was instructed to call office if medications are too costly. She was instructed to call office should she have SE from BP medications or if she notices her BP to drop too rapidly or too much.   She states she will get labs at the [facility] on [address] during their Saturday AM hours and will keep follow up on 3/10

## 2014-12-25 NOTE — Patient Instructions (Addendum)
THE FOLLOWING PRESCRIPTIONS HAVE BEEN SENT TO YOUR PHARMACY: 1. Amlodipine 5mg  - twice daily  2. Carvedilol 6.25mg  - twice daily ($4 list) 3. Hydralazine 25mg  - twice daily  ($4 list) 4. Losartan-hctz 50-12.5mg  twice daily   Please call our office to let us know if these medications are too costly   Please have fasting lab work before your appointment - CMET & lipids Please use a [facility]  >> there is one in Dr. Elvis CoilJordan's office building on the 1st floor - [address] >> there is one at [address] [employer]([facility])

## 2014-12-27 LAB — COMPREHENSIVE METABOLIC PANEL
ALK PHOS: 127 U/L — AB (ref 39–117)
ALT: 13 U/L (ref 0–35)
AST: 16 U/L (ref 0–37)
Albumin: 4.1 g/dL (ref 3.5–5.2)
BILIRUBIN TOTAL: 0.5 mg/dL (ref 0.2–1.2)
BUN: 12 mg/dL (ref 6–23)
CHLORIDE: 106 meq/L (ref 96–112)
CO2: 26 mEq/L (ref 19–32)
Calcium: 9.4 mg/dL (ref 8.4–10.5)
Creat: 0.66 mg/dL (ref 0.50–1.10)
Glucose, Bld: 93 mg/dL (ref 70–99)
POTASSIUM: 3.9 meq/L (ref 3.5–5.3)
SODIUM: 141 meq/L (ref 135–145)
TOTAL PROTEIN: 7.3 g/dL (ref 6.0–8.3)

## 2014-12-27 LAB — LIPID PANEL
CHOLESTEROL: 173 mg/dL (ref 0–200)
HDL: 46 mg/dL (ref >=46)
LDL Cholesterol: 102 mg/dL — ABNORMAL HIGH (ref 0–99)
TRIGLYCERIDES: 127 mg/dL (ref <150)
Total CHOL/HDL Ratio: 3.8 Ratio
VLDL: 25 mg/dL (ref 0–40)

## 2015-01-01 ENCOUNTER — Ambulatory Visit (INDEPENDENT_AMBULATORY_CARE_PROVIDER_SITE_OTHER): Payer: Managed Care, Other (non HMO) | Admitting: Cardiology

## 2015-01-01 ENCOUNTER — Encounter: Payer: Self-pay | Admitting: Cardiology

## 2015-01-01 VITALS — BP 152/90 | HR 70 | Ht 63.0 in | Wt 163.2 lb

## 2015-01-01 DIAGNOSIS — Z9114 Patient's other noncompliance with medication regimen: Secondary | ICD-10-CM

## 2015-01-01 DIAGNOSIS — I10 Essential (primary) hypertension: Secondary | ICD-10-CM

## 2015-01-01 MED ORDER — CARVEDILOL 12.5 MG PO TABS: 12.5000 mg | ORAL_TABLET | Freq: Two times a day (BID) | ORAL | Status: DC

## 2015-01-01 NOTE — Progress Notes (Signed)
    Lori Bennett Date of Birth: 1956-06-09   History of Present Illness: Lori Bennett is seen to reestablish care. She was last seen in February 2013. She has a history of severe HTN and noncompliance. She ran out of her medications a long time ago. Recently she returned to our office and BP was 212/110. Her medications were renewed and she returns today.  She states she has been feeling well. She denies any chest pain or dyspnea. She states she cannot afford lisinopril HCT. She eats a high sodium diet. She has a strong family history of HTN.   Current Outpatient Prescriptions on File Prior to Visit  Medication Sig Dispense Refill  . amLODipine (NORVASC) 5 MG tablet Take 1 tablet (5 mg total) by mouth 2 (two) times daily. 60 tablet 1  . hydrALAZINE (APRESOLINE) 25 MG tablet Take 1 tablet (25 mg total) by mouth 2 (two) times daily. 60 tablet 1   No current facility-administered medications on file prior to visit.    Allergies  Allergen Reactions  . Ace Inhibitors Cough  . Clonidine Derivatives Other (See Comments)    Severe dry mouth and lethargy.    Past Medical History  Diagnosis Date  . HTN (hypertension)   . Noncompliance with medications     Past Surgical History  Procedure Laterality Date  . Rotator cuff repair    . Tubal ligation      History  Smoking status  . Never Smoker   Smokeless tobacco  . Never Used    History  Alcohol Use No    Family History  Problem Relation Age of Onset  . Hypertension Mother     Review of Systems: The review of systems is as above.  All other systems were reviewed and are negative.  Physical Exam: BP 152/90 mmHg  Pulse 70  Ht 5\' 3"  (1.6 m)  Wt 163 lb 4 oz (74.05 kg)  BMI 28.93 kg/m2 Patient is very pleasant and in no acute distress. Skin is warm and dry. Color is normal.  HEENT is unremarkable. Normocephalic/atraumatic. PERRL. Sclera are nonicteric. Neck is supple. No masses. No JVD. Lungs are clear. Cardiac exam shows a  regular rate and rhythm. positive S4. Abdomen is soft. NT. No masses or bruits.  Extremities are without edema. Gait and ROM are intact. No gross neurologic deficits noted.  LABORATORY DATA: Lab Results  Component Value Date   GLUCOSE 93 12/27/2014   CHOL 173 12/27/2014   TRIG 127 12/27/2014   HDL 46 12/27/2014   LDLCALC 102* 12/27/2014   ALT 13 12/27/2014   AST 16 12/27/2014   NA 141 12/27/2014   K 3.9 12/27/2014   CL 106 12/27/2014   CREATININE 0.66 12/27/2014   BUN 12 12/27/2014   CO2 26 12/27/2014     Assessment / Plan: 1. Severe HTN. Recommend increasing Coreg to 12.5 mg bid. Continue current doses of hydralazine and amlodipine. Stop lisinopril HCT since she never filled. Recommend low sodium diet. Needs to increase aerobic activity. Stressed importance of compliance to reduce risk of hypertensive cardiac disease, renal disease and stroke. I will follow up in 6 months.

## 2015-01-01 NOTE — Patient Instructions (Signed)
Increase carvedilol to 12.5 mg twice a day.  Continue amlodipine 5 mg daily and hydralazine 25 mg twice a day.   You need to restrict your salt intake and eat more fruits and vegetables.   We will see you in 6 months.

## 2015-03-21 ENCOUNTER — Other Ambulatory Visit: Payer: Self-pay | Admitting: Cardiology

## 2015-03-24 ENCOUNTER — Other Ambulatory Visit: Payer: Self-pay

## 2015-03-24 MED ORDER — AMLODIPINE BESYLATE 5 MG PO TABS: 5.0000 mg | ORAL_TABLET | Freq: Two times a day (BID) | ORAL | Status: DC

## 2015-03-25 ENCOUNTER — Telehealth: Payer: Self-pay | Admitting: Cardiology

## 2015-03-25 MED ORDER — HYDRALAZINE HCL 25 MG PO TABS: 25.0000 mg | ORAL_TABLET | Freq: Two times a day (BID) | ORAL | Status: DC

## 2015-03-25 MED ORDER — CARVEDILOL 12.5 MG PO TABS: 12.5000 mg | ORAL_TABLET | Freq: Two times a day (BID) | ORAL | Status: DC

## 2015-03-25 NOTE — Telephone Encounter (Signed)
Pt called in stating that when she went to go pick up her medications she only received one of them, the  Amlodipine. She was told that the pharmacy could not refill her Carvedilol and Hydralazine. Please f/u with pt  Thanks

## 2015-03-25 NOTE — Telephone Encounter (Signed)
Returned call to patient refills for coreg and hydralazine sent to pharmacy.

## 2015-08-03 ENCOUNTER — Encounter: Payer: Self-pay | Admitting: Cardiology

## 2015-08-03 ENCOUNTER — Ambulatory Visit (INDEPENDENT_AMBULATORY_CARE_PROVIDER_SITE_OTHER): Payer: Managed Care, Other (non HMO) | Admitting: Cardiology

## 2015-08-03 VITALS — BP 148/84 | HR 64 | Ht 63.0 in | Wt 158.7 lb

## 2015-08-03 DIAGNOSIS — I10 Essential (primary) hypertension: Secondary | ICD-10-CM

## 2015-08-03 NOTE — Patient Instructions (Signed)
Continue your current therapy  We will see you in 6 months    

## 2015-08-03 NOTE — Progress Notes (Signed)
    Lori Bennett Date of Birth: Jul 28, 1956   History of Present Illness: Lori Bennett is seen for follow up of HTN.  She has a history of severe HTN and noncompliance. She ran out of her medications a long time ago. When seen in March she had not been taking medications and her BP was very high.  Her medications were renewed. She states she has been feeling well. She denies any chest pain or dyspnea. She does not monitor her BP. Reports doing better with salt in diet. Tolerating medications well.   Current Outpatient Prescriptions on File Prior to Visit  Medication Sig Dispense Refill  . amLODipine (NORVASC) 5 MG tablet Take 1 tablet (5 mg total) by mouth 2 (two) times daily. 60 tablet 5  . carvedilol (COREG) 12.5 MG tablet Take 1 tablet (12.5 mg total) by mouth 2 (two) times daily with a meal. 60 tablet 6  . hydrALAZINE (APRESOLINE) 25 MG tablet Take 1 tablet (25 mg total) by mouth 2 (two) times daily. 60 tablet 6   No current facility-administered medications on file prior to visit.    Allergies  Allergen Reactions  . Ace Inhibitors Cough  . Clonidine Derivatives Other (See Comments)    Severe dry mouth and lethargy.    Past Medical History  Diagnosis Date  . HTN (hypertension)   . Noncompliance with medications     Past Surgical History  Procedure Laterality Date  . Rotator cuff repair    . Tubal ligation      History  Smoking status  . Never Smoker   Smokeless tobacco  . Never Used    History  Alcohol Use No    Family History  Problem Relation Age of Onset  . Hypertension Mother     Review of Systems: The review of systems is as above. She complains of her legs aching a lot at night. She is on her feet all day working.   All other systems were reviewed and are negative.  Physical Exam: BP 142/84 mmHg  Pulse 64  Ht  (1.6 m)  Wt 71.986 kg (158 lb 11.2 oz)  BMI 28.12 kg/m2 Patient is very pleasant and in no acute distress. Skin is warm and dry. Color is  normal.  HEENT is unremarkable. Normocephalic/atraumatic. PERRL. Sclera are nonicteric. Neck is supple. No masses. No JVD. Lungs are clear. Cardiac exam shows a regular rate and rhythm. Normal S1-2. No gallop.  Abdomen is soft. NT. No masses or bruits.  Extremities are without edema. Gait and ROM are intact. No gross neurologic deficits noted.  LABORATORY DATA: Lab Results  Component Value Date   GLUCOSE 93 12/27/2014   CHOL 173 12/27/2014   TRIG 127 12/27/2014   HDL 46 12/27/2014   LDLCALC 102* 12/27/2014   ALT 13 12/27/2014   AST 16 12/27/2014   NA 141 12/27/2014   K 3.9 12/27/2014   CL 106 12/27/2014   CREATININE 0.66 12/27/2014   BUN 12 12/27/2014   CO2 26 12/27/2014   Ecg today shows NSR with rate 64. T wave abnormality c/w inferior and lateral ischemia- improved from 2013.   Assessment / Plan: 1. Severe HTN. BP has improved on medical therapy. Continue Coreg, hydralazine, and amlodipine as ordered.  Recommend low sodium diet. Needs to increase aerobic activity. Stressed importance of compliance to reduce risk of hypertensive cardiac disease, renal disease and stroke. IWill follow up in 6 months with extender.

## 2016-02-08 ENCOUNTER — Ambulatory Visit (INDEPENDENT_AMBULATORY_CARE_PROVIDER_SITE_OTHER): Payer: Managed Care, Other (non HMO) | Admitting: Cardiology

## 2016-02-08 ENCOUNTER — Encounter: Payer: Self-pay | Admitting: Cardiology

## 2016-02-08 VITALS — BP 136/82 | HR 71 | Ht 63.0 in | Wt 154.6 lb

## 2016-02-08 DIAGNOSIS — I10 Essential (primary) hypertension: Secondary | ICD-10-CM | POA: Diagnosis not present

## 2016-02-08 NOTE — Patient Instructions (Signed)
We will check blood work today  Continue your current therapy  I will see you in one year  

## 2016-02-08 NOTE — Progress Notes (Signed)
    Lori Bennett Date of Birth: Oct 08, 1956   History of Present Illness: Lori MaxwellCheryl is seen for follow up of HTN.  She has a history of severe HTN and noncompliance.  She has been back on medical therapy for the past year and is doing very well. She is tolerating her medications well. No SOB or chest pain. No dizziness. She does have leg cramps at night. Her job requires her to stand all day.   Current Outpatient Prescriptions on File Prior to Visit  Medication Sig Dispense Refill  . amLODipine (NORVASC) 5 MG tablet Take 1 tablet (5 mg total) by mouth 2 (two) times daily. 60 tablet 5  . carvedilol (COREG) 12.5 MG tablet Take 1 tablet (12.5 mg total) by mouth 2 (two) times daily with a meal. 60 tablet 6  . hydrALAZINE (APRESOLINE) 25 MG tablet Take 1 tablet (25 mg total) by mouth 2 (two) times daily. 60 tablet 6   No current facility-administered medications on file prior to visit.    Allergies  Allergen Reactions  . Ace Inhibitors Cough  . Clonidine Derivatives Other (See Comments)    Severe dry mouth and lethargy.    Past Medical History  Diagnosis Date  . HTN (hypertension)   . Noncompliance with medications     Past Surgical History  Procedure Laterality Date  . Rotator cuff repair    . Tubal ligation      History  Smoking status  . Never Smoker   Smokeless tobacco  . Never Used    History  Alcohol Use No    Family History  Problem Relation Age of Onset  . Hypertension Mother     Review of Systems: The review of systems is as above. She complains of her legs aching a lot at night. She is on her feet all day working.   All other systems were reviewed and are negative.  Physical Exam: BP 136/82 mmHg  Pulse 71  Ht 5\' 3"  (1.6 m)  Wt 70.126 kg (154 lb 9.6 oz)  BMI 27.39 kg/m2 Patient is very pleasant and in no acute distress. Skin is warm and dry. Color is normal.  HEENT is unremarkable. Normocephalic/atraumatic. PERRL. Sclera are nonicteric. Neck is supple. No  masses. No JVD. Lungs are clear. Cardiac exam shows a regular rate and rhythm. Normal S1-2. No gallop.  Abdomen is soft. NT. No masses or bruits.  Extremities are without edema. Gait and ROM are intact. No gross neurologic deficits noted.  LABORATORY DATA: Lab Results  Component Value Date   GLUCOSE 93 12/27/2014   CHOL 173 12/27/2014   TRIG 127 12/27/2014   HDL 46 12/27/2014   LDLCALC 102* 12/27/2014   ALT 13 12/27/2014   AST 16 12/27/2014   NA 141 12/27/2014   K 3.9 12/27/2014   CL 106 12/27/2014   CREATININE 0.66 12/27/2014   BUN 12 12/27/2014   CO2 26 12/27/2014    Assessment / Plan: 1. Severe HTN. BP has improved on medical therapy. Continue Coreg, hydralazine, and amlodipine as ordered.  Recommend low sodium diet.  Stressed importance of compliance. Will check CMET, CBC, and lipids today. IWill follow up in one year.

## 2016-02-13 LAB — COMPREHENSIVE METABOLIC PANEL
ALK PHOS: 107 U/L (ref 33–130)
ALT: 13 U/L (ref 6–29)
AST: 14 U/L (ref 10–35)
Albumin: 4.1 g/dL (ref 3.6–5.1)
BILIRUBIN TOTAL: 0.4 mg/dL (ref 0.2–1.2)
BUN: 7 mg/dL (ref 7–25)
CALCIUM: 9.3 mg/dL (ref 8.6–10.4)
CO2: 24 mmol/L (ref 20–31)
Chloride: 107 mmol/L (ref 98–110)
Creat: 0.64 mg/dL (ref 0.50–0.99)
Glucose, Bld: 105 mg/dL — ABNORMAL HIGH (ref 65–99)
Potassium: 3.5 mmol/L (ref 3.5–5.3)
Sodium: 144 mmol/L (ref 135–146)
Total Protein: 7.6 g/dL (ref 6.1–8.1)

## 2016-02-13 LAB — CBC WITH DIFFERENTIAL/PLATELET
BASOS PCT: 0 %
Basophils Absolute: 0 cells/uL (ref 0–200)
Eosinophils Absolute: 124 cells/uL (ref 15–500)
Eosinophils Relative: 2 %
HEMATOCRIT: 36.7 % (ref 35.0–45.0)
Hemoglobin: 11.7 g/dL (ref 11.7–15.5)
LYMPHS PCT: 52 %
Lymphs Abs: 3224 cells/uL (ref 850–3900)
MCH: 28.6 pg (ref 27.0–33.0)
MCHC: 31.9 g/dL — ABNORMAL LOW (ref 32.0–36.0)
MCV: 89.7 fL (ref 80.0–100.0)
MONO ABS: 372 {cells}/uL (ref 200–950)
MPV: 10.4 fL (ref 7.5–12.5)
Monocytes Relative: 6 %
NEUTROS ABS: 2480 {cells}/uL (ref 1500–7800)
Neutrophils Relative %: 40 %
Platelets: 298 10*3/uL (ref 140–400)
RBC: 4.09 MIL/uL (ref 3.80–5.10)
RDW: 14 % (ref 11.0–15.0)
WBC: 6.2 10*3/uL (ref 3.8–10.8)

## 2016-02-13 LAB — LIPID PANEL
Cholesterol: 173 mg/dL (ref 125–200)
HDL: 49 mg/dL (ref >=46)
LDL Cholesterol: 106 mg/dL (ref <130)
TRIGLYCERIDES: 92 mg/dL (ref <150)
Total CHOL/HDL Ratio: 3.5 Ratio (ref <=5.0)
VLDL: 18 mg/dL (ref <30)

## 2016-02-19 ENCOUNTER — Other Ambulatory Visit: Payer: Self-pay

## 2016-02-20 ENCOUNTER — Other Ambulatory Visit: Payer: Self-pay | Admitting: Cardiology

## 2016-02-22 ENCOUNTER — Telehealth: Payer: Self-pay | Admitting: Cardiology

## 2016-02-22 MED ORDER — AMLODIPINE BESYLATE 5 MG PO TABS: 5.0000 mg | ORAL_TABLET | Freq: Two times a day (BID) | ORAL | Status: DC

## 2016-02-22 NOTE — Telephone Encounter (Signed)
Left msg to call.

## 2016-02-22 NOTE — Telephone Encounter (Signed)
Refills sent, labwork reported along w/ Dr. Elvis CoilJordan's recommendations, pt voiced thanks and understanding.

## 2016-02-22 NOTE — Telephone Encounter (Signed)
Lori Bennett calling to get lab results . Also she went to get her Amlodipine , the pharmacist told her it had no refills.   Please call  Thanks

## 2016-02-22 NOTE — Telephone Encounter (Signed)
Follow up ° ° ° ° °Returned a call to the nurse °

## 2016-04-09 ENCOUNTER — Other Ambulatory Visit: Payer: Self-pay | Admitting: Cardiology

## 2017-01-10 ENCOUNTER — Other Ambulatory Visit: Payer: Self-pay | Admitting: Cardiology

## 2017-01-10 NOTE — Telephone Encounter (Signed)
Rx(s) sent to pharmacy electronically.  

## 2017-02-24 ENCOUNTER — Other Ambulatory Visit: Payer: Self-pay | Admitting: Cardiology

## 2017-02-27 ENCOUNTER — Other Ambulatory Visit: Payer: Self-pay

## 2017-02-27 MED ORDER — AMLODIPINE BESYLATE 5 MG PO TABS
5.0000 mg | ORAL_TABLET | Freq: Every day | ORAL | 0 refills | Status: DC
Start: 2017-02-27 — End: 2017-05-18

## 2017-05-11 NOTE — Progress Notes (Signed)
Selinda Flavin Date of Birth: [birth year]   History of Present Illness: Chrisa is seen for follow up of HTN.  Last seen in April 2017. She has a history of severe HTN and noncompliance.  She has been back on medical therapy for the past year and is doing very well. She is tolerating her medications well. No SOB or chest pain. No dizziness. She does have cramps in her right leg at night. Her job requires her to stand all day. No other new health problems. Needs refills on meds.   Allergies as of 05/18/2017      Reactions   Ace Inhibitors Cough   Clonidine Derivatives Other (See Comments)   Severe dry mouth and lethargy.      Medication List       Accurate as of 05/18/17  9:24 AM. Always use your most recent med list.          amLODipine 5 MG tablet Commonly known as:  NORVASC Take 1 tablet (5 mg total) by mouth 2 (two) times daily.   carvedilol 12.5 MG tablet Commonly known as:  COREG Take 1 tablet (12.5 mg total) by mouth 2 (two) times daily with a meal.   hydrALAZINE 25 MG tablet Commonly known as:  APRESOLINE Take 1 tablet (25 mg total) by mouth 2 (two) times daily.        Allergies  Allergen Reactions  . Ace Inhibitors Cough  . Clonidine Derivatives Other (See Comments)    Severe dry mouth and lethargy.    Past Medical History:  Diagnosis Date  . HTN (hypertension)   . Noncompliance with medications     Past Surgical History:  Procedure Laterality Date  . ROTATOR CUFF REPAIR    . TUBAL LIGATION      History  Smoking Status  . Never Smoker  Smokeless Tobacco  . Never Used    History  Alcohol Use No    Family History  Problem Relation Age of Onset  . Hypertension Mother     Review of Systems: The review of systems is as above. She complains of her legs aching a lot at night. She is on her feet all day working.   All other systems were reviewed and are negative.  Physical Exam: BP (!) 152/81   Pulse 60   Ht 5\' 3"  (1.6 m)   Wt 158 lb  12.8 oz (72 kg)   BMI 28.13 kg/m  Patient is very pleasant and in no acute distress. Skin is warm and dry. Color is normal.  HEENT is unremarkable. Normocephalic/atraumatic. PERRL. Sclera are nonicteric. Neck is supple. No masses. No JVD. Lungs are clear. Cardiac exam shows a regular rate and rhythm. Normal S1-2. No gallop.  Abdomen is soft. NT. No masses or bruits.  Extremities are without edema. Gait and ROM are intact. No gross neurologic deficits noted.  LABORATORY DATA: Lab Results  Component Value Date   WBC 6.2 02/13/2016   HGB 11.7 02/13/2016   HCT 36.7 02/13/2016   PLT 298 02/13/2016   GLUCOSE 105 (H) 02/13/2016   CHOL 173 02/13/2016   TRIG 92 02/13/2016   HDL 49 02/13/2016   LDLCALC 106 02/13/2016   ALT 13 02/13/2016   AST 14 02/13/2016   NA 144 02/13/2016   K 3.5 02/13/2016   CL 107 02/13/2016   CREATININE 0.64 02/13/2016   BUN 7 02/13/2016   CO2 24 02/13/2016   Ecg today shows NSR with ST and T wave changes  of anterolateral and inferior ischemia. Unchanged from 2016. I have personally reviewed and interpreted this study.  Assessment / Plan: 1. Severe HTN. BP has improved on medical therapy. Continue Coreg, hydralazine, and amlodipine as ordered.  Continue  low sodium diet.   Will check CMET and lipids today. I will follow up in one year.

## 2017-05-18 ENCOUNTER — Encounter: Payer: Self-pay | Admitting: Cardiology

## 2017-05-18 ENCOUNTER — Ambulatory Visit (INDEPENDENT_AMBULATORY_CARE_PROVIDER_SITE_OTHER): Payer: Managed Care, Other (non HMO) | Admitting: Cardiology

## 2017-05-18 VITALS — BP 152/81 | HR 60 | Ht 63.0 in | Wt 158.8 lb

## 2017-05-18 DIAGNOSIS — I10 Essential (primary) hypertension: Secondary | ICD-10-CM | POA: Diagnosis not present

## 2017-05-18 LAB — HEPATIC FUNCTION PANEL
ALBUMIN: 4.3 g/dL (ref 3.6–4.8)
ALT: 14 IU/L (ref 0–32)
AST: 18 IU/L (ref 0–40)
Alkaline Phosphatase: 101 IU/L (ref 39–117)
Bilirubin Total: 0.3 mg/dL (ref 0.0–1.2)
Bilirubin, Direct: 0.1 mg/dL (ref 0.00–0.40)
TOTAL PROTEIN: 7.6 g/dL (ref 6.0–8.5)

## 2017-05-18 LAB — BASIC METABOLIC PANEL
BUN / CREAT RATIO: 15 (ref 12–28)
BUN: 9 mg/dL (ref 8–27)
CALCIUM: 9.4 mg/dL (ref 8.7–10.3)
CO2: 23 mmol/L (ref 20–29)
CREATININE: 0.62 mg/dL (ref 0.57–1.00)
Chloride: 103 mmol/L (ref 96–106)
GFR calc Af Amer: 113 mL/min/{1.73_m2} (ref >59)
GFR calc non Af Amer: 98 mL/min/{1.73_m2} (ref >59)
GLUCOSE: 196 mg/dL — AB (ref 65–99)
Potassium: 3.4 mmol/L — ABNORMAL LOW (ref 3.5–5.2)
Sodium: 142 mmol/L (ref 134–144)

## 2017-05-18 LAB — LIPID PANEL
CHOL/HDL RATIO: 3.8 ratio (ref 0.0–4.4)
Cholesterol, Total: 157 mg/dL (ref 100–199)
HDL: 41 mg/dL (ref >39)
LDL Calculated: 81 mg/dL (ref 0–99)
Triglycerides: 173 mg/dL — ABNORMAL HIGH (ref 0–149)
VLDL CHOLESTEROL CAL: 35 mg/dL (ref 5–40)

## 2017-05-18 MED ORDER — HYDRALAZINE HCL 25 MG PO TABS: 25.0000 mg | ORAL_TABLET | Freq: Two times a day (BID) | ORAL | 11 refills | Status: DC

## 2017-05-18 MED ORDER — AMLODIPINE BESYLATE 5 MG PO TABS: 5.0000 mg | ORAL_TABLET | Freq: Two times a day (BID) | ORAL | 11 refills | Status: DC

## 2017-05-18 MED ORDER — CARVEDILOL 12.5 MG PO TABS: 12.5000 mg | ORAL_TABLET | Freq: Two times a day (BID) | ORAL | 11 refills | Status: DC

## 2017-05-18 NOTE — Patient Instructions (Addendum)
We will check blood work today  Continue your current therapy  I will see you in one year  

## 2017-05-23 ENCOUNTER — Other Ambulatory Visit: Payer: Self-pay

## 2017-05-23 DIAGNOSIS — R739 Hyperglycemia, unspecified: Secondary | ICD-10-CM

## 2017-05-23 DIAGNOSIS — I10 Essential (primary) hypertension: Secondary | ICD-10-CM

## 2017-08-23 LAB — BASIC METABOLIC PANEL
BUN/Creatinine Ratio: 16 (ref 12–28)
BUN: 9 mg/dL (ref 8–27)
CALCIUM: 9.2 mg/dL (ref 8.7–10.3)
CO2: 23 mmol/L (ref 20–29)
CREATININE: 0.56 mg/dL — AB (ref 0.57–1.00)
Chloride: 105 mmol/L (ref 96–106)
GFR calc Af Amer: 116 mL/min/{1.73_m2} (ref >59)
GFR, EST NON AFRICAN AMERICAN: 101 mL/min/{1.73_m2} (ref >59)
Glucose: 103 mg/dL — ABNORMAL HIGH (ref 65–99)
Potassium: 3.7 mmol/L (ref 3.5–5.2)
Sodium: 142 mmol/L (ref 134–144)

## 2017-08-24 LAB — HEMOGLOBIN A1C
Est. average glucose Bld gHb Est-mCnc: 146 mg/dL
Hgb A1c MFr Bld: 6.7 % — ABNORMAL HIGH (ref 4.8–5.6)

## 2018-02-02 ENCOUNTER — Other Ambulatory Visit: Payer: Self-pay | Admitting: Physician Assistant

## 2018-02-02 DIAGNOSIS — Z1231 Encounter for screening mammogram for malignant neoplasm of breast: Secondary | ICD-10-CM

## 2018-03-05 ENCOUNTER — Ambulatory Visit
Admission: RE | Admit: 2018-03-05 | Discharge: 2018-03-05 | Disposition: A | Discharge status: Home / Self Care | Payer: Managed Care, Other (non HMO) | Source: Ambulatory Visit | Attending: Physician Assistant | Admitting: Physician Assistant

## 2018-03-05 DIAGNOSIS — Z1231 Encounter for screening mammogram for malignant neoplasm of breast: Secondary | ICD-10-CM

## 2018-03-07 ENCOUNTER — Other Ambulatory Visit: Payer: Self-pay | Admitting: Physician Assistant

## 2018-03-07 DIAGNOSIS — R928 Other abnormal and inconclusive findings on diagnostic imaging of breast: Secondary | ICD-10-CM

## 2018-03-29 ENCOUNTER — Ambulatory Visit
Admission: RE | Admit: 2018-03-29 | Discharge: 2018-03-29 | Disposition: A | Discharge status: Home / Self Care | Payer: Managed Care, Other (non HMO) | Source: Ambulatory Visit | Attending: Physician Assistant | Admitting: Physician Assistant

## 2018-03-29 DIAGNOSIS — R928 Other abnormal and inconclusive findings on diagnostic imaging of breast: Secondary | ICD-10-CM

## 2018-05-30 HISTORY — PX: PARTIAL KNEE ARTHROPLASTY: SHX2174

## 2018-06-14 HISTORY — PX: OTHER SURGICAL HISTORY: SHX169

## 2018-06-17 ENCOUNTER — Other Ambulatory Visit: Payer: Self-pay | Admitting: Cardiology

## 2018-06-17 DIAGNOSIS — I10 Essential (primary) hypertension: Secondary | ICD-10-CM

## 2018-11-14 DIAGNOSIS — E119 Type 2 diabetes mellitus without complications: Secondary | ICD-10-CM

## 2018-11-14 DIAGNOSIS — M1712 Unilateral primary osteoarthritis, left knee: Secondary | ICD-10-CM | POA: Insufficient documentation

## 2018-11-14 NOTE — H&P (Signed)
KNEE ARTHROPLASTY ADMISSION H&P  Patient ID: Lori Bennett MRN: [id number] DOB/AGE: January 29, 1956 63 y.o.  Chief Complaint: left knee pain.  Planned Procedure Date: 222/18/20 Medical Clearance by Kennyth ArnoldStacy Wingate    Cardiac Clearance by Dr. Morene Rankinsoserio   HPI: Lori FlavinCheryl Bennett is a 63 y.o. female with a history of DM, HTN, HLD who presents for evaluation of OA LEFT KNEE. The patient has a history of pain and functional disability in the left knee due to arthritis and has failed non-surgical conservative treatments for greater than 12 weeks to include NSAID's and/or analgesics, corticosteriod injections, use of assistive devices and activity modification.  Onset of symptoms was gradual, starting 1 years ago with gradually worsening course since that time.  Patient currently rates pain at 10 out of 10 with activity. Patient has night pain, worsening of pain with activity and weight bearing and pain that interferes with activities of daily living.  Patient has evidence of subchondral cysts, subchondral sclerosis, periarticular osteophytes and joint space narrowing by imaging studies.  There is no active infection.  Past Medical History:  Diagnosis Date  . HTN (hypertension)   . Noncompliance with medications    Past Surgical History:  Procedure Laterality Date  . ROTATOR CUFF REPAIR    . TUBAL LIGATION     Allergies  Allergen Reactions  . Ace Inhibitors Cough  . Clonidine Derivatives Other (See Comments)    Severe dry mouth and lethargy.   Prior to Admission medications   Medication Sig Start Date End Date Taking? Authorizing Provider  amLODipine (NORVASC) 5 MG tablet Take 1 tablet (5 mg total) by mouth 2 (two) times daily. NEED OV. 06/18/18   [place], Peter M, MD  carvedilol (COREG) 12.5 MG tablet Take 1 tablet (12.5 mg total) by mouth 2 (two) times daily with a meal. NEED OV. 06/18/18   [place], Peter M, MD  hydrALAZINE (APRESOLINE) 25 MG tablet Take 1 tablet (25 mg total) by mouth 2 (two) times  daily. NEED OV. 06/18/18   [place], Peter M, MD   Social History: Single non smoker.  No alcohol use.  She is a Landscape architectroutery operator.  Family History  Problem Relation Age of Onset  . Hypertension Mother     ROS: Currently denies lightheadedness, dizziness, Fever, chills, CP, SOB.   No personal history of DVT, PE, MI, or CVA. No loose teeth.  She has dentures. All other systems have been reviewed and were otherwise currently negative with the exception of those mentioned in the HPI and as above.  Objective: Vitals: Ht: 5'3" Wt: 165 Temp: 97.5 BP: 149/83 Pulse: 75 O2 96% on room air.   Physical Exam: General: Alert, NAD.  Antalgic Gait  HEENT: EOMI, Good Neck Extension  Pulm: No increased work of breathing.  Clear B/L A/P w/o crackle or wheeze.  CV: RRR, No m/g/r appreciated  GI: soft, NT, ND Neuro: Neuro without gross focal deficit.  Sensation intact distally Skin: No lesions in the area of chief complaint MSK/Surgical Site: left knee w/o redness or effusion.  + JLT. ROM 5-115.  5/5 strength in extension and flexion.  +EHL/FHL.  NVI.  Stable varus and valgus stress.    Imaging Review Plain radiographs demonstrate severe degenerative joint disease of the left knee.   Preoperative templating of the joint replacement has been completed, documented, and submitted to the Operating Room personnel in order to optimize intra-operative equipment management.  Assessment: OA LEFT KNEE Active Problems:   * No active hospital problems. *  Plan: Plan for Procedure(s): TOTAL KNEE ARTHROPLASTY  The patient history, physical exam, clinical judgement of the provider and imaging are consistent with end stage degenerative joint disease and total joint arthroplasty is deemed medically necessary. The treatment options including medical management, injection therapy, and arthroplasty were discussed at length. The risks and benefits of Procedure(s): TOTAL KNEE ARTHROPLASTY were presented and reviewed.   The risks of nonoperative treatment, versus surgical intervention including but not limited to continued pain, aseptic loosening, stiffness, dislocation/subluxation, infection, bleeding, nerve injury, blood clots, cardiopulmonary complications, morbidity, mortality, among others were discussed. The patient verbalizes understanding and wishes to proceed with the plan.  Patient is being admitted for inpatient treatment for surgery, pain control, PT, OT, prophylactic antibiotics, VTE prophylaxis, progressive ambulation, ADL's and discharge planning.   Dental prophylaxis discussed and recommended for 2 years postoperatively.   The patient does meet the criteria for TXA which will be used perioperatively.    ASA 81 mg BID will be used postoperatively for DVT prophylaxis in addition to SCDs, and early ambulation.  Inpatient approval by insurance.  Plan for Oxycodone, Duexis or ibuprofen w/ gastric protection.  PONV after first surgery.  Possible N/V w/ Celebrex.  Rx for Lyrica 75 mg BID and Robaxin.  The patient is planning to be discharged home with home health services ([facility]) in care of her sisters, family, and friends.  Anticipated LOS equal to or less than than 2 midnights.  Inpatient status approved by insurance. - She is not Age 63 and older.  She does have one or more of the following:  - Expected need for hospital services (PT, OT, Nursing) required for safe  discharge  - Active co-morbidities: Diabetes   Albina BilletHenry Calvin Martensen III, PA-C 11/14/2018 9:27 AM

## 2018-12-04 ENCOUNTER — Other Ambulatory Visit (HOSPITAL_COMMUNITY): Payer: Managed Care, Other (non HMO)

## 2018-12-11 ENCOUNTER — Inpatient Hospital Stay (HOSPITAL_COMMUNITY)
Admission: RE | Admit: 2018-12-11 | Payer: Managed Care, Other (non HMO) | Source: Home / Self Care | Admitting: Orthopedic Surgery

## 2018-12-11 ENCOUNTER — Encounter (HOSPITAL_COMMUNITY): Admission: RE | Payer: Self-pay | Source: Home / Self Care

## 2018-12-11 SURGERY — ARTHROPLASTY, KNEE, TOTAL
Anesthesia: Choice | Laterality: Left

## 2019-04-02 ENCOUNTER — Other Ambulatory Visit: Payer: Self-pay | Admitting: Physician Assistant

## 2019-04-02 DIAGNOSIS — Z780 Asymptomatic menopausal state: Secondary | ICD-10-CM

## 2019-04-02 DIAGNOSIS — Z1231 Encounter for screening mammogram for malignant neoplasm of breast: Secondary | ICD-10-CM

## 2019-04-08 NOTE — H&P (Signed)
KNEE ARTHROPLASTY ADMISSION H&P  Patient ID: Lori Bennett MRN: [id number] DOB/AGE: 1956/05/03 63 y.o.  Chief Complaint: left knee pain.  Medical Clearance by Marzetta Board Wingate              Cardiac Clearance by Dr. Hilliard Clark  HPI: Lori Bennett is a 63 y.o. female  with a history of DM, HTN, HLD who presents for evaluation of OA LEFT KNEE.  Surgery previously scheduled was delayed due to uncontrolled DM and then the viral pandemic.  Recent A1c was significantly improved (6.3).The patient has a history of pain and functional disability in the left knee due to arthritis and has failed non-surgical conservative treatments for greater than 12 weeks to include NSAID's and/or analgesics, corticosteriod injections, use of assistive devices and activity modification.  Onset of symptoms was gradual, starting 2 years ago with gradually worsening course since that time.  Patient currently rates pain at 9 out of 10 with activity. Patient has night pain, worsening of pain with activity and weight bearing and pain that interferes with activities of daily living.  Patient has evidence of subchondral cysts, subchondral sclerosis, periarticular osteophytes and joint space narrowing by imaging studies.  There is no active infection.  Past Medical History:  Diagnosis Date  . HTN (hypertension)   . Noncompliance with medications    Past Surgical History:  Procedure Laterality Date  . ROTATOR CUFF REPAIR    . TUBAL LIGATION     Allergies  Allergen Reactions  . Ace Inhibitors Cough  . Clonidine Derivatives Other (See Comments)    Severe dry mouth and lethargy.   Medications: Carvedilol 12.5 mg BID Amlodipine 5 mg BID Hydralazine 25 mg BID Aspirin 81 mg Daily Farxiga 5 mg Daily Trulicity 7.37 mg / 0.5 ml inj. Weekly Gabapentin 100 mg Daily prn  Social History: Single non smoker.  No alcohol use.  She is a Catering manager.  Family History  Problem Relation Age of Onset  . Hypertension Mother      ROS: Currently denies lightheadedness, dizziness, Fever, chills, CP, SOB.   No personal history of DVT, PE, MI, or CVA. No loose teeth.  She has dentures. All other systems have been reviewed and were otherwise currently negative with the exception of those mentioned in the HPI and as above.  Objective: Vitals: Ht: 5'3" Wt: 151 Temp: 98.0 BP: 163/86 Pulse: 70 O2 96% on room air.   Physical Exam: General: Alert, NAD.  Antalgic Gait  HEENT: EOMI, Good Neck Extension  Pulm: No increased work of breathing.  Clear B/L A/P w/o crackle or wheeze.  CV: RRR, No m/g/r appreciated  GI: soft, NT, ND Neuro: Neuro without gross focal deficit.  Sensation intact distally Skin: No lesions in the area of chief complaint MSK/Surgical Site: Left knee w/o redness or effusion.  + JLT. ROM 0-115.  5/5 strength in extension and flexion.  +EHL/FHL.  NVI.  Stable varus and valgus stress.    Imaging Review Plain radiographs demonstrate severe degenerative joint disease of the left knee.   Preoperative templating of the joint replacement has been completed, documented, and submitted to the Operating Room personnel in order to optimize intra-operative equipment management.  Assessment: OA LEFT KNEE Principal Problem:   Primary osteoarthritis of left knee Active Problems:   Essential hypertension   DM2 (diabetes mellitus, type 2) ([place])   Plan: Plan for Procedure(s): TOTAL KNEE ARTHROPLASTY  The patient history, physical exam, clinical judgement of the provider and imaging are consistent with end stage degenerative  joint disease and total joint arthroplasty is deemed medically necessary. The treatment options including medical management, injection therapy, and arthroplasty were discussed at length. The risks and benefits of Procedure(s): TOTAL KNEE ARTHROPLASTY were presented and reviewed.  The risks of nonoperative treatment, versus surgical intervention including but not limited to continued pain,  aseptic loosening, stiffness, dislocation/subluxation, infection, bleeding, nerve injury, blood clots, cardiopulmonary complications, morbidity, mortality, among others were discussed. The patient verbalizes understanding and wishes to proceed with the plan.  Patient is being admitted for surgery, pain control, PT, prophylactic antibiotics, VTE prophylaxis, progressive ambulation, ADL's and discharge planning.   Dental prophylaxis discussed and recommended for 2 years postoperatively.  She has dentures.   The patient does meet the criteria for TXA which will be used perioperatively.    ASA 81 mg BID will be used postoperatively for DVT prophylaxis in addition to SCDs, and early ambulation.  Plan for Oxycodone, Ibuprofen w/ gastric protection.  PONV after first surgery.  Possible N/V w/ Celebrex.  Rx for Gabapentin BID and Robaxin.  The patient is planning to be discharged home with OPPT starting 05/02/[address] in care of her sisters, family, and friends.   Patient's anticipated LOS is less than 2 midnights, meeting these requirements: - Younger than 5665 - Lives within 1 hour of care - Has a competent adult at home to recover with post-op recover - NO history of  - Chronic pain requiring opiods  - Coronary Artery Disease  - Heart failure  - Heart attack  - Stroke  - DVT/VTE  - Cardiac arrhythmia  - Respiratory Failure/COPD  - Renal failure  - Anemia  - Advanced Liver disease   Albina BilletHenry Calvin Martensen III, PA-C 04/08/2019 8:20 AM

## 2019-04-18 NOTE — Patient Instructions (Addendum)
YOU NEED TO HAVE A COVID 19 TEST ON______Friday July, 3rd_______, THIS TEST MUST BE DONE BEFORE SURGERY, [facility].             ONCE YOUR COVID TEST IS COMPLETED, PLEASE BEGIN THE QUARANTINE INSTRUCTIONS AS OUTLINED IN YOUR HANDOUT.                 Lori FlavinCheryl Bennett    Your procedure is scheduled on: Tuesday 04/30/2019  [facility]              Report to  Short Stay at   0530 AM                   Call this number if you have problems the morning of surgery [phone]    Remember:   NO SOLID FOOD AFTER MIDNIGHT THE NIGHT PRIOR TO SURGERY. NOTHING BY MOUTH EXCEPT CLEAR LIQUIDS UNTIL 3 HOURS PRIOR TO SCHEULED SURGERY.             PLEASE FINISH  Gatorade G 2  DRINK PER SURGEON ORDER 3 HOURS PRIOR TO SCHEDULED SURGERY TIME WHICH NEEDS TO BE COMPLETED AT _0430 am.   CLEAR LIQUID DIET   Foods Allowed                                                                     Foods Excluded  Coffee and tea, regular and decaf                             liquids that you cannot  Plain Jell-O in any flavor                                             see through such as: Fruit ices (not with fruit pulp)                                     milk, soups, orange juice  Iced Popsicles                                    All solid food Carbonated beverages, regular and diet                                    Cranberry, grape and apple juices Sports drinks like Gatorade Lightly seasoned clear broth or consume(fat free) Sugar, honey syrup  Sample Menu Breakfast                                Lunch  Supper Cranberry juice                    Beef broth                            Chicken broth Jell-O                                     Grape juice                           Apple juice Coffee or tea                        Jell-O                                      Popsicle                        Coffee or tea                        Coffee or tea  _____________________________________________________________________                BRUSH YOUR TEETH MORNING OF SURGERY AND RINSE YOUR MOUTH OUT, NO CHEWING GUM CANDY OR MINTS.     Take these medicines the morning of surgery with A SIP OF WATER: amlodipine, carvedilol, hydralazine               DO NOT TAKE ANY DIABETIC MEDICATIONS DAY OF YOUR SURGERY!                                You may not have any metal on your body including hair pins and              piercings  Do not wear jewelry, make-up, lotions, powders or perfumes, deodorant             Do not wear nail polish.  Do not shave  48 hours prior to surgery.              Do not bring valuables to the hospital. [name]  Contacts, dentures or bridgework may not be worn into surgery.  Leave suitcase in the car. After surgery it may be brought to your room.                Please read over the following fact sheets you were given: _____________________________________________________________________             [facility] - Preparing for Surgery Before surgery, you can play an important role.  Because skin is not sterile, your skin needs to be as free of germs as possible.  You can reduce the number of germs on your skin by washing with CHG (chlorahexidine gluconate) soap before surgery.  CHG is an antiseptic cleaner which kills germs and bonds with the skin to continue killing germs even after washing. Please DO NOT use if you have an allergy to CHG or antibacterial  soaps.  If your skin becomes reddened/irritated stop using the CHG and inform your nurse when you arrive at Short Stay. Do not shave (including legs and underarms) for at least 48 hours prior to the first CHG shower.  You may shave your face/neck. Please follow these instructions carefully:  1.  Shower with CHG Soap the night before surgery  and the  morning of Surgery.  2.  If you choose to wash your hair, wash your hair first as usual with your  normal  shampoo.  3.  After you shampoo, rinse your hair and body thoroughly to remove the  shampoo.                           4.  Use CHG as you would any other liquid soap.  You can apply chg directly  to the skin and wash                       Gently with a scrungie or clean washcloth.  5.  Apply the CHG Soap to your body ONLY FROM THE NECK DOWN.   Do not use on face/ open                           Wound or open sores. Avoid contact with eyes, ears mouth and genitals (private parts).                       Wash face,  Genitals (private parts) with your normal soap.             6.  Wash thoroughly, paying special attention to the area where your surgery  will be performed.  7.  Thoroughly rinse your body with warm water from the neck down.  8.  DO NOT shower/wash with your normal soap after using and rinsing off  the CHG Soap.                9.  Pat yourself dry with a clean towel.            10.  Wear clean pajamas.            11.  Place clean sheets on your bed the night of your first shower and do not  sleep with pets. Day of Surgery : Do not apply any lotions/deodorants the morning of surgery.  Please wear clean clothes to the hospital/surgery center.  FAILURE TO FOLLOW THESE INSTRUCTIONS MAY RESULT IN THE CANCELLATION OF YOUR SURGERY PATIENT SIGNATURE_________________________________  NURSE SIGNATURE__________________________________  ________________________________________________________________________   Lori Bennett  [name] is a tool that can help keep your lungs clear and active. This tool measures how well you are filling your lungs with each breath. Taking long deep breaths may help reverse or decrease the chance of developing breathing (pulmonary) problems (especially infection) following:  A long period of time when you are unable to move or  be active. BEFORE THE PROCEDURE   If the Bennett includes an indicator to show your best effort, your nurse or respiratory therapist will set it to a desired goal.  If possible, sit up straight or lean slightly forward. Try not to slouch.  Hold the Lori Bennett in an upright position. INSTRUCTIONS FOR USE  1. Sit on the edge of your bed if possible, or sit up as far as you can in bed  or on a chair. 2. Hold the Lori Bennett in an upright position. 3. Breathe out normally. 4. Place the mouthpiece in your mouth and seal your lips tightly around it. 5. Breathe in slowly and as deeply as possible, raising the piston or the ball toward the top of the column. 6. Hold your breath for 3-5 seconds or for as long as possible. Allow the piston or ball to fall to the bottom of the column. 7. Remove the mouthpiece from your mouth and breathe out normally. 8. Rest for a few seconds and repeat Steps 1 through 7 at least 10 times every 1-2 hours when you are awake. Take your time and take a few normal breaths between deep breaths. 9. The Bennett may include an indicator to show your best effort. Use the indicator as a goal to work toward during each repetition. 10. After each set of 10 deep breaths, practice coughing to be sure your lungs are clear. If you have an incision (the cut made at the time of surgery), support your incision when coughing by placing a pillow or rolled up towels firmly against it. Once you are able to get out of bed, walk around indoors and cough well. You may stop using the Lori Bennett when instructed by your caregiver.  RISKS AND COMPLICATIONS  Take your time so you do not get dizzy or light-headed.  If you are in pain, you may need to take or ask for pain medication before doing Lori spirometry. It is harder to take a deep breath if you are having pain. AFTER USE  Rest and breathe slowly and easily.  It can be helpful to keep track of a log  of your progress. Your caregiver can provide you with a simple table to help with this. If you are using the Bennett at home, follow these instructions: SEEK MEDICAL CARE IF:   You are having difficultly using the Bennett.  You have trouble using the Bennett as often as instructed.  Your pain medication is not giving enough relief while using the Bennett.  You develop fever of 100.5 F (38.1 C) or higher. SEEK IMMEDIATE MEDICAL CARE IF:   You cough up bloody sputum that had not been present before.  You develop fever of 102 F (38.9 C) or greater.  You develop worsening pain at or near the incision site. MAKE SURE YOU:   Understand these instructions.  Will watch your condition.  Will get help right away if you are not doing well or get worse. Document Released: 02/20/2007 Document Revised: 01/02/2012 Document Reviewed: 04/23/2007 [facility] Patient Information 2014 [employer] [place].   ________________________________________________________________________

## 2019-04-22 ENCOUNTER — Other Ambulatory Visit: Payer: Self-pay

## 2019-04-22 ENCOUNTER — Encounter (HOSPITAL_COMMUNITY)
Admission: RE | Admit: 2019-04-22 | Discharge: 2019-04-22 | Disposition: A | Discharge status: Home / Self Care | Payer: 59 | Source: Ambulatory Visit | Attending: Orthopedic Surgery | Admitting: Orthopedic Surgery

## 2019-04-22 ENCOUNTER — Encounter (HOSPITAL_COMMUNITY): Payer: Self-pay

## 2019-04-22 DIAGNOSIS — Z01818 Encounter for other preprocedural examination: Secondary | ICD-10-CM | POA: Diagnosis present

## 2019-04-22 HISTORY — DX: Unspecified osteoarthritis, unspecified site: M19.90

## 2019-04-22 HISTORY — DX: Other specified postprocedural states: R11.2

## 2019-04-22 HISTORY — DX: Personal history of other medical treatment: Z92.89

## 2019-04-22 HISTORY — DX: Neuralgia and neuritis, unspecified: M79.2

## 2019-04-22 HISTORY — DX: Type 2 diabetes mellitus without complications: E11.9

## 2019-04-22 HISTORY — DX: Other specified postprocedural states: Z98.890

## 2019-04-22 LAB — BASIC METABOLIC PANEL
Anion gap: 8 (ref 5–15)
BUN: 15 mg/dL (ref 8–23)
CO2: 25 mmol/L (ref 22–32)
Calcium: 9.2 mg/dL (ref 8.9–10.3)
Chloride: 110 mmol/L (ref 98–111)
Creatinine, Ser: 0.66 mg/dL (ref 0.44–1.00)
GFR calc Af Amer: >60 mL/min (ref >60)
GFR calc non Af Amer: >60 mL/min (ref >60)
Glucose, Bld: 90 mg/dL (ref 70–99)
Potassium: 3.5 mmol/L (ref 3.5–5.1)
Sodium: 143 mmol/L (ref 135–145)

## 2019-04-22 LAB — URINALYSIS, ROUTINE W REFLEX MICROSCOPIC
Bilirubin Urine: NEGATIVE
Glucose, UA: >=500 mg/dL — AB
Hgb urine dipstick: NEGATIVE
Ketones, ur: NEGATIVE mg/dL
Nitrite: NEGATIVE
Protein, ur: NEGATIVE mg/dL
Specific Gravity, Urine: 1.014 (ref 1.005–1.030)
pH: 5 (ref 5.0–8.0)

## 2019-04-22 LAB — SURGICAL PCR SCREEN
MRSA, PCR: NEGATIVE
Staphylococcus aureus: NEGATIVE

## 2019-04-22 LAB — CBC
HCT: 40.5 % (ref 36.0–46.0)
Hemoglobin: 12.4 g/dL (ref 12.0–15.0)
MCH: 28.8 pg (ref 26.0–34.0)
MCHC: 30.6 g/dL (ref 30.0–36.0)
MCV: 94.2 fL (ref 80.0–100.0)
Platelets: 276 10*3/uL (ref 150–400)
RBC: 4.3 MIL/uL (ref 3.87–5.11)
RDW: 13.3 % (ref 11.5–15.5)
WBC: 7.3 10*3/uL (ref 4.0–10.5)
nRBC: 0 % (ref 0.0–0.2)

## 2019-04-22 LAB — GLUCOSE, CAPILLARY: Glucose-Capillary: 94 mg/dL (ref 70–99)

## 2019-04-22 LAB — HEMOGLOBIN A1C
Hgb A1c MFr Bld: 6.2 % — ABNORMAL HIGH (ref 4.8–5.6)
Mean Plasma Glucose: 131.24 mg/dL

## 2019-04-26 ENCOUNTER — Other Ambulatory Visit (HOSPITAL_COMMUNITY)
Admission: RE | Admit: 2019-04-26 | Discharge: 2019-04-26 | Disposition: A | Discharge status: Home / Self Care | Payer: 59 | Source: Ambulatory Visit | Attending: Orthopedic Surgery | Admitting: Orthopedic Surgery

## 2019-04-26 DIAGNOSIS — Z01812 Encounter for preprocedural laboratory examination: Secondary | ICD-10-CM | POA: Diagnosis present

## 2019-04-26 DIAGNOSIS — Z1159 Encounter for screening for other viral diseases: Secondary | ICD-10-CM | POA: Diagnosis not present

## 2019-04-26 LAB — SARS CORONAVIRUS 2 (TAT 6-24 HRS): SARS Coronavirus 2: NEGATIVE

## 2019-04-29 MED ORDER — BUPIVACAINE LIPOSOME 1.3 % IJ SUSP
20.0000 mL | Freq: Once | INTRAMUSCULAR | Status: DC
  Filled 2019-04-29: qty 20

## 2019-04-29 NOTE — Anesthesia Preprocedure Evaluation (Addendum)
Anesthesia Evaluation  Patient identified by MRN, date of birth, ID band Patient awake    Reviewed: Allergy & Precautions, NPO status , Patient's Chart, lab work & pertinent test results, reviewed documented beta blocker date and time   History of Anesthesia Complications (+) PONV and history of anesthetic complications  Airway Mallampati: I  TM Distance: >3 FB Neck ROM: Full    Dental no notable dental hx. (+) Edentulous Upper, Edentulous Lower   Pulmonary neg pulmonary ROS,    Pulmonary exam normal breath sounds clear to auscultation       Cardiovascular hypertension, Pt. on home beta blockers and Pt. on medications negative cardio ROS Normal cardiovascular exam Rhythm:Regular Rate:Normal     Neuro/Psych negative neurological ROS  negative psych ROS   GI/Hepatic negative GI ROS, Neg liver ROS,   Endo/Other  negative endocrine ROSdiabetes, Oral Hypoglycemic Agents  Renal/GU negative Renal ROS  negative genitourinary   Musculoskeletal  (+) Arthritis ,   Abdominal   Peds  Hematology negative hematology ROS (+)   Anesthesia Other Findings   Reproductive/Obstetrics                            Anesthesia Physical Anesthesia Plan  ASA: II  Anesthesia Plan: Regional and Spinal   Post-op Pain Management:  Regional for Post-op pain   Induction: Intravenous  PONV Risk Score and Plan: 3 and Midazolam, Propofol infusion, Dexamethasone and Ondansetron  Airway Management Planned: Natural Airway and Simple Face Mask  Additional Equipment:   Intra-op Plan:   Post-operative Plan:   Informed Consent: I have reviewed the patients History and Physical, chart, labs and discussed the procedure including the risks, benefits and alternatives for the proposed anesthesia with the patient or authorized representative who has indicated his/her understanding and acceptance.     Dental advisory  given  Plan Discussed with: CRNA  Anesthesia Plan Comments:         Anesthesia Quick Evaluation

## 2019-04-30 ENCOUNTER — Other Ambulatory Visit: Payer: Self-pay

## 2019-04-30 ENCOUNTER — Ambulatory Visit (HOSPITAL_COMMUNITY): Payer: 59 | Admitting: Physician Assistant

## 2019-04-30 ENCOUNTER — Observation Stay (HOSPITAL_COMMUNITY)
Admission: RE | Admit: 2019-04-30 | Discharge: 2019-05-01 | Disposition: A | Discharge status: Home / Self Care | Payer: 59 | Attending: Orthopedic Surgery | Admitting: Orthopedic Surgery

## 2019-04-30 ENCOUNTER — Encounter (HOSPITAL_COMMUNITY)
Admission: RE | Disposition: A | Discharge status: Home / Self Care | Payer: Self-pay | Source: Home / Self Care | Attending: Orthopedic Surgery

## 2019-04-30 ENCOUNTER — Ambulatory Visit (HOSPITAL_COMMUNITY): Payer: 59 | Admitting: Anesthesiology

## 2019-04-30 ENCOUNTER — Encounter (HOSPITAL_COMMUNITY): Payer: Self-pay | Admitting: *Deleted

## 2019-04-30 ENCOUNTER — Observation Stay (HOSPITAL_COMMUNITY): Payer: 59

## 2019-04-30 DIAGNOSIS — Z7982 Long term (current) use of aspirin: Secondary | ICD-10-CM | POA: Insufficient documentation

## 2019-04-30 DIAGNOSIS — M1710 Unilateral primary osteoarthritis, unspecified knee: Secondary | ICD-10-CM | POA: Diagnosis present

## 2019-04-30 DIAGNOSIS — M1712 Unilateral primary osteoarthritis, left knee: Principal | ICD-10-CM | POA: Diagnosis present

## 2019-04-30 DIAGNOSIS — M25762 Osteophyte, left knee: Secondary | ICD-10-CM | POA: Insufficient documentation

## 2019-04-30 DIAGNOSIS — E119 Type 2 diabetes mellitus without complications: Secondary | ICD-10-CM | POA: Diagnosis not present

## 2019-04-30 DIAGNOSIS — Z9114 Patient's other noncompliance with medication regimen: Secondary | ICD-10-CM | POA: Insufficient documentation

## 2019-04-30 DIAGNOSIS — I10 Essential (primary) hypertension: Secondary | ICD-10-CM | POA: Insufficient documentation

## 2019-04-30 DIAGNOSIS — E785 Hyperlipidemia, unspecified: Secondary | ICD-10-CM | POA: Insufficient documentation

## 2019-04-30 DIAGNOSIS — Z79899 Other long term (current) drug therapy: Secondary | ICD-10-CM | POA: Insufficient documentation

## 2019-04-30 DIAGNOSIS — Z96659 Presence of unspecified artificial knee joint: Secondary | ICD-10-CM

## 2019-04-30 DIAGNOSIS — Z7984 Long term (current) use of oral hypoglycemic drugs: Secondary | ICD-10-CM | POA: Insufficient documentation

## 2019-04-30 HISTORY — PX: TOTAL KNEE ARTHROPLASTY: SHX125

## 2019-04-30 LAB — GLUCOSE, CAPILLARY
Glucose-Capillary: 97 mg/dL (ref 70–99)
Glucose-Capillary: 153 mg/dL — ABNORMAL HIGH (ref 70–99)
Glucose-Capillary: 147 mg/dL — ABNORMAL HIGH (ref 70–99)
Glucose-Capillary: 169 mg/dL — ABNORMAL HIGH (ref 70–99)
Glucose-Capillary: 127 mg/dL — ABNORMAL HIGH (ref 70–99)

## 2019-04-30 SURGERY — ARTHROPLASTY, KNEE, TOTAL
Anesthesia: Regional | Laterality: Left

## 2019-04-30 MED ORDER — PROPOFOL 10 MG/ML IV BOLUS
INTRAVENOUS | Status: AC
  Filled 2019-04-30: qty 60

## 2019-04-30 MED ORDER — ACETAMINOPHEN 500 MG PO TABS: 1000.0000 mg | ORAL_TABLET | Freq: Once | ORAL | Status: DC

## 2019-04-30 MED ORDER — GABAPENTIN 300 MG PO CAPS
300.0000 mg | ORAL_CAPSULE | Freq: Once | ORAL | Status: AC
  Administered 2019-04-30: 300 mg via ORAL
  Filled 2019-04-30: qty 1

## 2019-04-30 MED ORDER — AMLODIPINE BESYLATE 5 MG PO TABS
5.0000 mg | ORAL_TABLET | Freq: Two times a day (BID) | ORAL | Status: DC
  Administered 2019-04-30 – 2019-05-01 (×2): 5 mg via ORAL
  Filled 2019-04-30 (×2): qty 1

## 2019-04-30 MED ORDER — DOCUSATE SODIUM 100 MG PO CAPS: 100.0000 mg | ORAL_CAPSULE | Freq: Two times a day (BID) | ORAL | 0 refills | Status: DC

## 2019-04-30 MED ORDER — PROPOFOL 500 MG/50ML IV EMUL
INTRAVENOUS | Status: DC | PRN
  Administered 2019-04-30: 100 ug/kg/min via INTRAVENOUS

## 2019-04-30 MED ORDER — ONDANSETRON HCL 4 MG PO TABS: 4.0000 mg | ORAL_TABLET | Freq: Four times a day (QID) | ORAL | Status: DC | PRN

## 2019-04-30 MED ORDER — FENTANYL CITRATE (PF) 100 MCG/2ML IJ SOLN
25.0000 ug | INTRAMUSCULAR | Status: DC | PRN
  Administered 2019-04-30: 25 ug via INTRAVENOUS
  Administered 2019-04-30: 50 ug via INTRAVENOUS

## 2019-04-30 MED ORDER — MENTHOL 3 MG MT LOZG: 1.0000 | LOZENGE | OROMUCOSAL | Status: DC | PRN

## 2019-04-30 MED ORDER — METHOCARBAMOL 500 MG PO TABS: 500.0000 mg | ORAL_TABLET | Freq: Four times a day (QID) | ORAL | Status: DC | PRN

## 2019-04-30 MED ORDER — FENTANYL CITRATE (PF) 100 MCG/2ML IJ SOLN
INTRAMUSCULAR | Status: AC
  Filled 2019-04-30: qty 2

## 2019-04-30 MED ORDER — HYDRALAZINE HCL 25 MG PO TABS
25.0000 mg | ORAL_TABLET | Freq: Two times a day (BID) | ORAL | Status: DC
  Administered 2019-04-30 – 2019-05-01 (×2): 25 mg via ORAL
  Filled 2019-04-30 (×2): qty 1

## 2019-04-30 MED ORDER — PHENYLEPHRINE 40 MCG/ML (10ML) SYRINGE FOR IV PUSH (FOR BLOOD PRESSURE SUPPORT)
PREFILLED_SYRINGE | INTRAVENOUS | Status: DC | PRN
  Administered 2019-04-30: 80 ug via INTRAVENOUS

## 2019-04-30 MED ORDER — CHLORHEXIDINE GLUCONATE 4 % EX LIQD: 60.0000 mL | Freq: Once | CUTANEOUS | Status: DC

## 2019-04-30 MED ORDER — TRANEXAMIC ACID-NACL 1000-0.7 MG/100ML-% IV SOLN
1000.0000 mg | INTRAVENOUS | Status: AC
  Administered 2019-04-30: 1000 mg via INTRAVENOUS
  Filled 2019-04-30: qty 100

## 2019-04-30 MED ORDER — CEFAZOLIN SODIUM-DEXTROSE 1-4 GM/50ML-% IV SOLN
1.0000 g | Freq: Four times a day (QID) | INTRAVENOUS | Status: AC
  Administered 2019-04-30 (×2): 1 g via INTRAVENOUS
  Filled 2019-04-30 (×2): qty 50

## 2019-04-30 MED ORDER — METOCLOPRAMIDE HCL 5 MG/ML IJ SOLN: 5.0000 mg | Freq: Three times a day (TID) | INTRAMUSCULAR | Status: DC | PRN

## 2019-04-30 MED ORDER — SODIUM CHLORIDE 0.9 % IR SOLN
Status: DC | PRN
  Administered 2019-04-30: 1000 mL

## 2019-04-30 MED ORDER — LACTATED RINGERS IV SOLN
INTRAVENOUS | Status: DC
  Administered 2019-04-30 – 2019-05-01 (×2): via INTRAVENOUS

## 2019-04-30 MED ORDER — PHENYLEPHRINE 40 MCG/ML (10ML) SYRINGE FOR IV PUSH (FOR BLOOD PRESSURE SUPPORT)
PREFILLED_SYRINGE | INTRAVENOUS | Status: AC
  Filled 2019-04-30: qty 10

## 2019-04-30 MED ORDER — GABAPENTIN 300 MG PO CAPS: 300.0000 mg | ORAL_CAPSULE | Freq: Two times a day (BID) | ORAL | 0 refills | Status: DC

## 2019-04-30 MED ORDER — CANAGLIFLOZIN 100 MG PO TABS
100.0000 mg | ORAL_TABLET | Freq: Every day | ORAL | Status: DC
  Filled 2019-04-30: qty 1

## 2019-04-30 MED ORDER — OXYCODONE HCL 5 MG PO TABS: 5.0000 mg | ORAL_TABLET | ORAL | 0 refills | Status: AC | PRN

## 2019-04-30 MED ORDER — KETOROLAC TROMETHAMINE 15 MG/ML IJ SOLN
15.0000 mg | Freq: Four times a day (QID) | INTRAMUSCULAR | Status: AC
  Administered 2019-04-30 – 2019-05-01 (×4): 15 mg via INTRAVENOUS
  Filled 2019-04-30 (×4): qty 1

## 2019-04-30 MED ORDER — SODIUM CHLORIDE 0.9% FLUSH
INTRAVENOUS | Status: DC | PRN
  Administered 2019-04-30: 30 mL

## 2019-04-30 MED ORDER — HYDROMORPHONE HCL 1 MG/ML IJ SOLN
0.5000 mg | INTRAMUSCULAR | Status: DC | PRN
  Administered 2019-04-30: 1 mg via INTRAVENOUS
  Filled 2019-04-30: qty 1

## 2019-04-30 MED ORDER — SODIUM CHLORIDE (PF) 0.9 % IJ SOLN
INTRAMUSCULAR | Status: AC
  Filled 2019-04-30: qty 50

## 2019-04-30 MED ORDER — POLYETHYLENE GLYCOL 3350 17 G PO PACK: 17.0000 g | PACK | Freq: Every day | ORAL | Status: DC | PRN

## 2019-04-30 MED ORDER — METHOCARBAMOL 500 MG IVPB - SIMPLE MED
500.0000 mg | Freq: Four times a day (QID) | INTRAVENOUS | Status: DC | PRN
  Administered 2019-04-30: 13:00:00 500 mg via INTRAVENOUS
  Filled 2019-04-30: qty 500
  Filled 2019-04-30: qty 50

## 2019-04-30 MED ORDER — ONDANSETRON HCL 4 MG/2ML IJ SOLN: 4.0000 mg | Freq: Four times a day (QID) | INTRAMUSCULAR | Status: DC | PRN

## 2019-04-30 MED ORDER — OMEPRAZOLE 20 MG PO CPDR: 20.0000 mg | DELAYED_RELEASE_CAPSULE | Freq: Every day | ORAL | 0 refills | Status: DC

## 2019-04-30 MED ORDER — ACETAMINOPHEN 325 MG PO TABS: 325.0000 mg | ORAL_TABLET | Freq: Four times a day (QID) | ORAL | Status: DC | PRN

## 2019-04-30 MED ORDER — ONDANSETRON HCL 4 MG/2ML IJ SOLN
INTRAMUSCULAR | Status: DC | PRN
  Administered 2019-04-30: 4 mg via INTRAVENOUS

## 2019-04-30 MED ORDER — DEXAMETHASONE SODIUM PHOSPHATE 10 MG/ML IJ SOLN
INTRAMUSCULAR | Status: AC
  Filled 2019-04-30: qty 1

## 2019-04-30 MED ORDER — ASPIRIN EC 81 MG PO TBEC: 81.0000 mg | DELAYED_RELEASE_TABLET | Freq: Two times a day (BID) | ORAL | 0 refills | Status: DC

## 2019-04-30 MED ORDER — METHOCARBAMOL 500 MG PO TABS: 500.0000 mg | ORAL_TABLET | Freq: Three times a day (TID) | ORAL | 0 refills | Status: DC | PRN

## 2019-04-30 MED ORDER — POVIDONE-IODINE 10 % EX SWAB
2.0000 "application " | Freq: Once | CUTANEOUS | Status: AC
  Administered 2019-04-30: 2 via TOPICAL

## 2019-04-30 MED ORDER — STERILE WATER FOR IRRIGATION IR SOLN
Status: DC | PRN
  Administered 2019-04-30: 2000 mL

## 2019-04-30 MED ORDER — METOCLOPRAMIDE HCL 5 MG PO TABS: 5.0000 mg | ORAL_TABLET | Freq: Three times a day (TID) | ORAL | Status: DC | PRN

## 2019-04-30 MED ORDER — ASPIRIN 81 MG PO CHEW
81.0000 mg | CHEWABLE_TABLET | Freq: Two times a day (BID) | ORAL | Status: DC
  Administered 2019-04-30 – 2019-05-01 (×2): 81 mg via ORAL
  Filled 2019-04-30 (×2): qty 1

## 2019-04-30 MED ORDER — METHOCARBAMOL 500 MG IVPB - SIMPLE MED
INTRAVENOUS | Status: AC
  Filled 2019-04-30: qty 50

## 2019-04-30 MED ORDER — PROPOFOL 10 MG/ML IV BOLUS
INTRAVENOUS | Status: DC | PRN
  Administered 2019-04-30: 30 mg via INTRAVENOUS
  Administered 2019-04-30: 20 mg via INTRAVENOUS

## 2019-04-30 MED ORDER — SORBITOL 70 % SOLN
30.0000 mL | Freq: Every day | Status: DC | PRN
  Filled 2019-04-30: qty 30

## 2019-04-30 MED ORDER — INSULIN ASPART 100 UNIT/ML ~~LOC~~ SOLN
0.0000 [IU] | Freq: Three times a day (TID) | SUBCUTANEOUS | Status: DC
  Administered 2019-04-30: 3 [IU] via SUBCUTANEOUS
  Administered 2019-04-30: 2 [IU] via SUBCUTANEOUS
  Administered 2019-05-01: 09:00:00 3 [IU] via SUBCUTANEOUS

## 2019-04-30 MED ORDER — DOCUSATE SODIUM 100 MG PO CAPS
100.0000 mg | ORAL_CAPSULE | Freq: Two times a day (BID) | ORAL | Status: DC
  Administered 2019-04-30 – 2019-05-01 (×2): 100 mg via ORAL
  Filled 2019-04-30 (×2): qty 1

## 2019-04-30 MED ORDER — ACETAMINOPHEN 500 MG PO TABS
1000.0000 mg | ORAL_TABLET | Freq: Once | ORAL | Status: AC
  Administered 2019-04-30: 06:00:00 1000 mg via ORAL
  Filled 2019-04-30: qty 2

## 2019-04-30 MED ORDER — BUPIVACAINE LIPOSOME 1.3 % IJ SUSP
INTRAMUSCULAR | Status: DC | PRN
  Administered 2019-04-30: 20 mL

## 2019-04-30 MED ORDER — ACETAMINOPHEN 500 MG PO TABS: 1000.0000 mg | ORAL_TABLET | Freq: Three times a day (TID) | ORAL | 0 refills | Status: AC

## 2019-04-30 MED ORDER — CARVEDILOL 12.5 MG PO TABS
12.5000 mg | ORAL_TABLET | Freq: Two times a day (BID) | ORAL | Status: DC
  Administered 2019-04-30 – 2019-05-01 (×2): 12.5 mg via ORAL
  Filled 2019-04-30 (×2): qty 1

## 2019-04-30 MED ORDER — MIDAZOLAM HCL 2 MG/2ML IJ SOLN
INTRAMUSCULAR | Status: AC
  Filled 2019-04-30: qty 2

## 2019-04-30 MED ORDER — ONDANSETRON HCL 4 MG/2ML IJ SOLN
INTRAMUSCULAR | Status: AC
  Filled 2019-04-30: qty 2

## 2019-04-30 MED ORDER — PHENOL 1.4 % MT LIQD
1.0000 | OROMUCOSAL | Status: DC | PRN
  Filled 2019-04-30: qty 177

## 2019-04-30 MED ORDER — POVIDONE-IODINE 10 % EX SWAB: 2.0000 "application " | Freq: Once | CUTANEOUS | Status: DC

## 2019-04-30 MED ORDER — ONDANSETRON HCL 4 MG PO TABS: 4.0000 mg | ORAL_TABLET | Freq: Three times a day (TID) | ORAL | 0 refills | Status: DC | PRN

## 2019-04-30 MED ORDER — LACTATED RINGERS IV SOLN
INTRAVENOUS | Status: DC
  Administered 2019-04-30 (×2): via INTRAVENOUS

## 2019-04-30 MED ORDER — DEXAMETHASONE SODIUM PHOSPHATE 10 MG/ML IJ SOLN
10.0000 mg | Freq: Once | INTRAMUSCULAR | Status: AC
  Administered 2019-05-01: 10 mg via INTRAVENOUS
  Filled 2019-04-30: qty 1

## 2019-04-30 MED ORDER — DIPHENHYDRAMINE HCL 12.5 MG/5ML PO ELIX: 12.5000 mg | ORAL_SOLUTION | ORAL | Status: DC | PRN

## 2019-04-30 MED ORDER — DEXAMETHASONE SODIUM PHOSPHATE 10 MG/ML IJ SOLN
INTRAMUSCULAR | Status: DC | PRN
  Administered 2019-04-30: 10 mg via INTRAVENOUS

## 2019-04-30 MED ORDER — BUPIVACAINE-EPINEPHRINE (PF) 0.5% -1:200000 IJ SOLN
INTRAMUSCULAR | Status: DC | PRN
  Administered 2019-04-30: 20 mL via PERINEURAL

## 2019-04-30 MED ORDER — MIDAZOLAM HCL 5 MG/5ML IJ SOLN
INTRAMUSCULAR | Status: DC | PRN
  Administered 2019-04-30: 2 mg via INTRAVENOUS

## 2019-04-30 MED ORDER — 0.9 % SODIUM CHLORIDE (POUR BTL) OPTIME
TOPICAL | Status: DC | PRN
  Administered 2019-04-30: 07:00:00 1000 mL

## 2019-04-30 MED ORDER — IBUPROFEN 800 MG PO TABS: 800.0000 mg | ORAL_TABLET | Freq: Three times a day (TID) | ORAL | 0 refills | Status: AC | PRN

## 2019-04-30 MED ORDER — CLONIDINE HCL (ANALGESIA) 100 MCG/ML EP SOLN
EPIDURAL | Status: DC | PRN
  Administered 2019-04-30: 100 ug

## 2019-04-30 MED ORDER — OXYCODONE HCL 5 MG PO TABS
5.0000 mg | ORAL_TABLET | ORAL | Status: DC | PRN
  Administered 2019-04-30: 10 mg via ORAL
  Administered 2019-05-01: 5 mg via ORAL
  Filled 2019-04-30: qty 1
  Filled 2019-04-30 (×2): qty 2

## 2019-04-30 MED ORDER — GABAPENTIN 300 MG PO CAPS
300.0000 mg | ORAL_CAPSULE | Freq: Three times a day (TID) | ORAL | Status: DC
  Administered 2019-04-30 – 2019-05-01 (×3): 300 mg via ORAL
  Filled 2019-04-30 (×3): qty 1

## 2019-04-30 MED ORDER — CEFAZOLIN SODIUM-DEXTROSE 2-4 GM/100ML-% IV SOLN
2.0000 g | INTRAVENOUS | Status: AC
  Administered 2019-04-30: 2 g via INTRAVENOUS
  Filled 2019-04-30: qty 100

## 2019-04-30 MED ORDER — ACETAMINOPHEN 500 MG PO TABS
1000.0000 mg | ORAL_TABLET | Freq: Three times a day (TID) | ORAL | Status: DC
  Administered 2019-04-30 – 2019-05-01 (×3): 1000 mg via ORAL
  Filled 2019-04-30 (×3): qty 2

## 2019-04-30 MED ORDER — MAGNESIUM CITRATE PO SOLN: 1.0000 | Freq: Once | ORAL | Status: DC | PRN

## 2019-04-30 MED ORDER — FENTANYL CITRATE (PF) 100 MCG/2ML IJ SOLN
INTRAMUSCULAR | Status: DC | PRN
  Administered 2019-04-30: 50 ug via INTRAVENOUS

## 2019-04-30 MED ORDER — BUPIVACAINE IN DEXTROSE 0.75-8.25 % IT SOLN
INTRATHECAL | Status: DC | PRN
  Administered 2019-04-30: 1.6 mL via INTRATHECAL

## 2019-04-30 SURGICAL SUPPLY — 55 items
APL PRP STRL LF DISP 70% ISPRP (MISCELLANEOUS) ×2
BLADE HEX COATED 2.75 (ELECTRODE) ×3 IMPLANT
BLADE SAG 18X100X1.27 (BLADE) ×3 IMPLANT
BLADE SAGITTAL 25.0X1.37X90 (BLADE) ×2 IMPLANT
BLADE SAGITTAL 25.0X1.37X90MM (BLADE) ×1
BLADE SURG 15 STRL LF DISP TIS (BLADE) ×1 IMPLANT
BLADE SURG 15 STRL SS (BLADE) ×3
BLADE SURG SZ10 CARB STEEL (BLADE) ×6 IMPLANT
BNDG CMPR MED 10X6 ELC LF (GAUZE/BANDAGES/DRESSINGS) ×1
BNDG CMPR MED 15X6 ELC VLCR LF (GAUZE/BANDAGES/DRESSINGS) ×1
BNDG ELASTIC 6X10 VLCR STRL LF (GAUZE/BANDAGES/DRESSINGS) ×3 IMPLANT
BNDG ELASTIC 6X15 VLCR STRL LF (GAUZE/BANDAGES/DRESSINGS) ×2 IMPLANT
BOWL SMART MIX CTS (DISPOSABLE) IMPLANT
BSPLAT TIB 3 KN TRITANIUM (Knees) ×1 IMPLANT
CHLORAPREP W/TINT 26 (MISCELLANEOUS) ×6 IMPLANT
CLOSURE STERI-STRIP 1/2X4 (GAUZE/BANDAGES/DRESSINGS) ×1
CLSR STERI-STRIP ANTIMIC 1/2X4 (GAUZE/BANDAGES/DRESSINGS) ×2 IMPLANT
COMP FEMORAL TRIATHLON SZ3 (Joint) ×3 IMPLANT
COMPONENT FEMRL TRIATHLON SZ3 (Joint) IMPLANT
COVER SURGICAL LIGHT HANDLE (MISCELLANEOUS) ×3 IMPLANT
COVER WAND RF STERILE (DRAPES) IMPLANT
CUFF TOURN SGL QUICK 34 (TOURNIQUET CUFF) ×3
CUFF TRNQT CYL 34X4.125X (TOURNIQUET CUFF) ×1 IMPLANT
DECANTER SPIKE VIAL GLASS SM (MISCELLANEOUS) ×2 IMPLANT
DRAPE U-SHAPE 47X51 STRL (DRAPES) ×3 IMPLANT
DRSG MEPILEX BORDER 4X12 (GAUZE/BANDAGES/DRESSINGS) ×3 IMPLANT
GLOVE BIO SURGEON STRL SZ7.5 (GLOVE) ×6 IMPLANT
GLOVE BIOGEL PI IND STRL 8 (GLOVE) ×2 IMPLANT
GLOVE BIOGEL PI INDICATOR 8 (GLOVE) ×4
GOWN STRL REUS W/ TWL LRG LVL3 (GOWN DISPOSABLE) ×2 IMPLANT
GOWN STRL REUS W/TWL LRG LVL3 (GOWN DISPOSABLE) ×6
HANDPIECE INTERPULSE COAX TIP (DISPOSABLE) ×3
HOLDER FOLEY CATH W/STRAP (MISCELLANEOUS) IMPLANT
IMMOBILIZER KNEE 20 (SOFTGOODS) ×3
IMMOBILIZER KNEE 20 THIGH 36 (SOFTGOODS) IMPLANT
IMMOBILIZER KNEE 22 UNIV (SOFTGOODS) ×3 IMPLANT
INSERT TIB 3 11XCNDRL STAB BRN (Insert) IMPLANT
KIT TURNOVER KIT A (KITS) IMPLANT
KNEE INSERT TIB BRG (Insert) ×3 IMPLANT
KNEE PATELLA ASYMMETRIC 9X29 (Knees) ×2 IMPLANT
KNEE TIBIAL COMPONENT SZ3 (Knees) ×2 IMPLANT
MANIFOLD NEPTUNE II (INSTRUMENTS) ×3 IMPLANT
NS IRRIG 1000ML POUR BTL (IV SOLUTION) ×3 IMPLANT
PACK TOTAL KNEE CUSTOM (KITS) ×3 IMPLANT
PIN FLUTED HEDLESS FIX 3.5X1/8 (PIN) ×2 IMPLANT
PROTECTOR NERVE ULNAR (MISCELLANEOUS) ×3 IMPLANT
SET HNDPC FAN SPRY TIP SCT (DISPOSABLE) ×1 IMPLANT
SUT MNCRL AB 4-0 PS2 18 (SUTURE) ×3 IMPLANT
SUT VIC AB 0 CT1 36 (SUTURE) ×3 IMPLANT
SUT VIC AB 1 CT1 36 (SUTURE) ×8 IMPLANT
SUT VIC AB 2-0 CT1 27 (SUTURE) ×6
SUT VIC AB 2-0 CT1 TAPERPNT 27 (SUTURE) ×1 IMPLANT
TRAY FOLEY MTR SLVR 14FR STAT (SET/KITS/TRAYS/PACK) IMPLANT
TRAY FOLEY MTR SLVR 16FR STAT (SET/KITS/TRAYS/PACK) ×3 IMPLANT
YANKAUER SUCT BULB TIP 10FT TU (MISCELLANEOUS) ×3 IMPLANT

## 2019-04-30 NOTE — Discharge Instructions (Signed)

## 2019-04-30 NOTE — Transfer of Care (Signed)
Immediate Anesthesia Transfer of Care Note  Patient: Lori Bennett  Procedure(s) Performed: TOTAL KNEE ARTHROPLASTY (Left )  Patient Location: PACU  Anesthesia Type:Spinal  Level of Consciousness: awake, alert  and oriented  Airway & Oxygen Therapy: Patient Spontanous Breathing and Patient connected to face mask oxygen  Post-op Assessment: Report given to RN and Post -op Vital signs reviewed and stable  Post vital signs: Reviewed and stable  Last Vitals:  Vitals Value Taken Time  BP    Temp    Pulse    Resp    SpO2      Last Pain:  Vitals:   04/30/19 0537  TempSrc: Oral         Complications: No apparent anesthesia complications

## 2019-04-30 NOTE — Anesthesia Procedure Notes (Signed)
Anesthesia Regional Block: Adductor canal block   Pre-Anesthetic Checklist: ,, timeout performed, Correct Patient, Correct Site, Correct Laterality, Correct Procedure, Correct Position, site marked, Risks and benefits discussed,  Surgical consent,  Pre-op evaluation,  At surgeon's request and post-op pain management  Laterality: Left  Prep: Maximum Sterile Barrier Precautions used, chloraprep       Needles:  Injection technique: Single-shot  Needle Type: Echogenic Stimulator Needle     Needle Length: 9cm  Needle Gauge: 22     Additional Needles:   Procedures:,,,, ultrasound used (permanent image in chart),,,,  Narrative:  Start time: 04/30/2019 7:09 AM End time: 04/30/2019 7:19 AM Injection made incrementally with aspirations every 5 mL.  Performed by: Personally  Anesthesiologist: Freddrick March, MD  Additional Notes: Monitors applied. No increased pain on injection. No increased resistance to injection. Injection made in 5cc increments. Good needle visualization. Patient tolerated procedure well.

## 2019-04-30 NOTE — Op Note (Signed)
DATE OF SURGERY:  04/30/2019 TIME: 7:33 AM  PATIENT NAME:  Lori Bennett   AGE: 63 y.o.    PRE-OPERATIVE DIAGNOSIS:  OA LEFT KNEE  POST-OPERATIVE DIAGNOSIS:  Same  PROCEDURE:  Procedure(s): TOTAL KNEE ARTHROPLASTY   SURGEON:  Sheral Apleyimothy D Tyreanna Bisesi, MD   ASSISTANT:  Aquilla HackerHenry Martensen, PA-C, he was present and scrubbed throughout the case, critical for completion in a timely fashion, and for retraction, instrumentation, and closure.    OPERATIVE IMPLANTS: [employer] Triathlon CR. Press fit knee  Femur size 3, Tibia size 3, Patella size 29 3-peg oval button, with a 11 mm polyethylene insert.   PREOPERATIVE INDICATIONS:  Lori Bennett is a 63 y.o. year old female with end stage bone on bone degenerative arthritis of the knee who failed conservative treatment, including injections, antiinflammatories, activity modification, and assistive devices, and had significant impairment of their activities of daily living, and elected for Total Knee Arthroplasty.   The risks, benefits, and alternatives were discussed at length including but not limited to the risks of infection, bleeding, nerve injury, stiffness, blood clots, the need for revision surgery, cardiopulmonary complications, among others, and they were willing to proceed.   OPERATIVE DESCRIPTION:  The patient was brought to the operative room and placed in a supine position.  General anesthesia was administered.  IV antibiotics were given.  The lower extremity was prepped and draped in the usual sterile fashion.  Time out was performed.  The leg was elevated and exsanguinated and the tourniquet was inflated.  Anterior approach was performed.  The patella was everted and osteophytes were removed.  The anterior horn of the medial and lateral meniscus was removed.   The distal femur was opened with the drill and the intramedullary distal femoral cutting jig was utilized, set at 5 degrees resecting 8 mm off the distal femur.  Care was taken to  protect the collateral ligaments.  The distal femoral sizing jig was applied, taking care to avoid notching.  Then the 4-in-1 cutting jig was applied and the anterior and posterior femur was cut, along with the chamfer cuts.  All posterior osteophytes were removed.  The flexion gap was then measured and was symmetric with the extension gap.  Then the extramedullary tibial cutting jig was utilized making the appropriate cut using the anterior tibial crest as a reference building in appropriate posterior slope.  Care was taken during the cut to protect the medial and collateral ligaments.  The proximal tibia was removed along with the posterior horns of the menisci.  The PCL was sacrificed.    The extensor gap was measured and was approximately 9mm.    I completed the distal femoral preparation using the appropriate jig to prepare the box.  The patella was then measured, and cut with the saw.    The proximal tibia sized and prepared accordingly with the reamer and the punch, and then all components were trialed with the above sized poly insert.  The knee was found to have excellent balance and full motion.    The above named components were then impacted into place and Poly tibial piece and patella were inserted.  I was very happy with his stability and ROM  I performed a periarticular injection with marcaine and toradol  The knee was easily taken through a range of motion and the patella tracked well and the knee irrigated copiously and the parapatellar and subcutaneous tissue closed with vicryl, and monocryl with steri strips for the skin.  The incision was  dressed with sterile gauze and the tourniquet released and the patient was awakened and returned to the PACU in stable and satisfactory condition.  There were no complications.  Total tourniquet time was roughly 60 minutes.   POSTOPERATIVE PLAN: post op Abx, DVT px: SCD's, TED's, Early ambulation and chemical px

## 2019-04-30 NOTE — Progress Notes (Signed)
PT Cancellation Note  Patient Details Name: Lori Bennett MRN: [id number] DOB: April 19, 1956   Cancelled Treatment:    Reason Eval/Treat Not Completed: Pain limiting ability to participate;Fatigue/lethargy limiting ability to participate(per RN, pt is not yet ready for PT due to lethargy and pain. Will attempt later today.)   Philomena Doheny PT 04/30/2019  Acute Rehabilitation Services Pager [phone] Office [phone]

## 2019-04-30 NOTE — Anesthesia Postprocedure Evaluation (Signed)
Anesthesia Post Note  Patient: Eliana Lueth  Procedure(s) Performed: TOTAL KNEE ARTHROPLASTY (Left )     Patient location during evaluation: PACU Anesthesia Type: Regional and Spinal Level of consciousness: oriented and awake and alert Pain management: pain level controlled Vital Signs Assessment: post-procedure vital signs reviewed and stable Respiratory status: spontaneous breathing, respiratory function stable and patient connected to nasal cannula oxygen Cardiovascular status: blood pressure returned to baseline and stable Postop Assessment: no headache, no backache and no apparent nausea or vomiting Anesthetic complications: no    Last Vitals:  Vitals:   04/30/19 1000 04/30/19 1015  BP: 122/74 118/74  Pulse: 72 68  Resp: 17 17  Temp:    SpO2: 91% 91%    Last Pain:  Vitals:   04/30/19 1000  TempSrc:   PainSc: Asleep                 Azlee Monforte L Alycen Mack

## 2019-04-30 NOTE — Anesthesia Procedure Notes (Signed)
Spinal  Patient location during procedure: OR Start time: 04/30/2019 7:28 AM Staffing Resident/CRNA: [place] Minetta Krisher C, CRNA Performed: resident/CRNA  Preanesthetic Checklist Completed: patient identified, site marked, surgical consent, pre-op evaluation, timeout performed, IV checked, risks and benefits discussed and monitors and equipment checked Spinal Block Patient position: sitting Prep: DuraPrep Patient monitoring: heart rate, cardiac monitor, continuous pulse ox and blood pressure Approach: midline Location: L3-4 Injection technique: single-shot Needle Needle type: Pencan  Needle gauge: 24 G Needle length: 9 cm Assessment Sensory level: T4 Additional Notes IV functioning, monitors applied to pt. Expiration date of kit checked and confirmed to be in date. Sterile prep and drape, hand hygiene and sterile gloved used. Pt was positioned and spine was prepped in sterile fashion. Skin was anesthetized with lidocaine. Free flow of clear CSF obtained prior to injecting local anesthetic into CSF x 1 attempt. Spinal needle aspirated freely following injection. Needle was carefully withdrawn, and pt tolerated procedure well. Loss of motor and sensory on exam post injection.

## 2019-04-30 NOTE — Evaluation (Signed)
Physical Therapy Evaluation Patient Details Name: Lori Bennett MRN: [id number] DOB: 01-10-1956 Today's Date: 04/30/2019   History of Present Illness  L TKA; PMH R TKA August 2019  Clinical Impression  Pt is s/p TKA resulting in the deficits listed below (see PT Problem List). Bed to recliner with min assist. Activity tolerance limited by pain and fatigue. Vital signs stable.  Pt will benefit from skilled PT to increase their independence and safety with mobility to allow discharge to the venue listed below.      Follow Up Recommendations Follow surgeon's recommendation for DC plan and follow-up therapies;Outpatient PT(outpt PT on 7/9 per pt)    Equipment Recommendations  None recommended by PT    Recommendations for Other Services       Precautions / Restrictions Precautions Precautions: Knee Precaution Booklet Issued: Yes (comment) Precaution Comments: reviewed no pillow under knee Required Braces or Orthoses: Knee Immobilizer - Left Restrictions Weight Bearing Restrictions: No Other Position/Activity Restrictions: WBAT      Mobility  Bed Mobility Overal bed mobility: Needs Assistance Bed Mobility: Supine to Sit     Supine to sit: Min assist     General bed mobility comments: assist to raise trunk and to advance LLE  Transfers Overall transfer level: Needs assistance Equipment used: Rolling walker (2 wheeled) Transfers: Sit to/from [facility] Sit to Stand: Min assist Stand pivot transfers: Min assist       General transfer comment: VCs hand placement, assist to rise and steady  Ambulation/Gait             General Gait Details: deferred 2* pain and fatigue  Stairs            Wheelchair Mobility    Modified Rankin (Stroke Patients Only)       Balance Overall balance assessment: Needs assistance   Sitting balance-Leahy Scale: Good     Standing balance support: Bilateral upper extremity supported Standing balance-Leahy  Scale: Fair                               Pertinent Vitals/Pain Pain Assessment: 0-10 Pain Score: 8  Pain Location: L knee Pain Descriptors / Indicators: Sore Pain Intervention(s): Limited activity within patient's tolerance;Monitored during session;Premedicated before session;Ice applied    Home Living Family/patient expects to be discharged to:: Private residence   Available Help at Discharge: Family;Available 24 hours/day   Home Access: Stairs to enter   Entrance Stairs-Number of Steps: 1 Home Layout: One level Home Equipment: Walker - 2 wheels;Cane - single point;Bedside commode Additional Comments: plans to DC to sister's home which has 1 STE, her 2 sisters and children will provide 24* assist    Prior Function Level of Independence: Independent               Hand Dominance        Extremity/Trunk Assessment   Upper Extremity Assessment Upper Extremity Assessment: Overall WFL for tasks assessed    Lower Extremity Assessment Lower Extremity Assessment: LLE deficits/detail LLE Deficits / Details: SLR -2/5, knee 5-60* AAROM LLE Sensation: WNL    Cervical / Trunk Assessment Cervical / Trunk Assessment: Normal  Communication   Communication: No difficulties  Cognition Arousal/Alertness: Awake/alert Behavior During Therapy: WFL for tasks assessed/performed Overall Cognitive Status: Within Functional Limits for tasks assessed  General Comments      Exercises Total Joint Exercises Ankle Circles/Pumps: AROM;Both;10 reps;Supine Straight Leg Raises: AAROM;Left;5 reps;Supine Goniometric ROM: 5-60* AAROM L knee   Assessment/Plan    PT Assessment Patient needs continued PT services  PT Problem List Decreased strength;Decreased range of motion;Decreased activity tolerance;Pain;Decreased knowledge of use of DME;Decreased mobility       PT Treatment Interventions DME instruction;Gait  training;Therapeutic exercise;Therapeutic activities    PT Goals (Current goals can be found in the Care Plan section)  Acute Rehab PT Goals Patient Stated Goal: go to [place] with her sisters PT Goal Formulation: With patient Time For Goal Achievement: 05/07/19 Potential to Achieve Goals: Good    Frequency 7X/week   Barriers to discharge        Co-evaluation               AM-PAC PT "6 Clicks" Mobility  Outcome Measure Help needed turning from your back to your side while in a flat bed without using bedrails?: A Little Help needed moving from lying on your back to sitting on the side of a flat bed without using bedrails?: A Little Help needed moving to and from a bed to a chair (including a wheelchair)?: A Little Help needed standing up from a chair using your arms (e.g., wheelchair or bedside chair)?: A Little Help needed to walk in hospital room?: A Lot Help needed climbing 3-5 steps with a railing? : A Lot 6 Click Score: 16    End of Session Equipment Utilized During Treatment: Left knee immobilizer Activity Tolerance: Patient limited by pain;Patient limited by fatigue Patient left: in chair;with call bell/phone within reach;with chair alarm set Nurse Communication: Mobility status PT Visit Diagnosis: Muscle weakness (generalized) (M62.81);Difficulty in walking, not elsewhere classified (R26.2);Pain Pain - Right/Left: Left Pain - part of body: Knee    Time: [id number] PT Time Calculation (min) (ACUTE ONLY): 37 min   Charges:   PT Evaluation $PT Eval Low Complexity: 1 Low PT Treatments $Therapeutic Activity: 8-22 mins        Ralene BatheUhlenberg, Royann Wildasin Kistler PT 04/30/2019  Acute Rehabilitation Services Pager [phone] Office [phone]

## 2019-04-30 NOTE — Interval H&P Note (Signed)
I participated in the care of this patient and agree with the above history, physical and evaluation. I performed a review of the history and a physical exam as detailed   Jamorian Dimaria Daniel Margee Trentham MD  

## 2019-05-01 ENCOUNTER — Other Ambulatory Visit: Payer: Self-pay

## 2019-05-01 ENCOUNTER — Encounter (HOSPITAL_COMMUNITY): Payer: Self-pay | Admitting: Orthopedic Surgery

## 2019-05-01 DIAGNOSIS — M1712 Unilateral primary osteoarthritis, left knee: Secondary | ICD-10-CM | POA: Diagnosis not present

## 2019-05-01 LAB — GLUCOSE, CAPILLARY: Glucose-Capillary: 170 mg/dL — ABNORMAL HIGH (ref 70–99)

## 2019-05-01 NOTE — Discharge Summary (Signed)
Discharge Summary  Patient ID: Lori Bennett MRN: [id number] DOB/AGE: 04-18-1956 63 y.o.  Admit date: 04/30/2019 Discharge date: 05/01/2019  Admission Diagnoses:  Primary osteoarthritis of left knee  Discharge Diagnoses:  Principal Problem:   Primary osteoarthritis of left knee Active Problems:   Essential hypertension   DM2 (diabetes mellitus, type 2) (HCC)   Primary localized osteoarthritis of knee   Past Medical History:  Diagnosis Date  . Arthritis   . Diabetes mellitus without complication ([place])    type 2 , does not check cbg at home   . History of blood transfusion 30 years ago   after vaginal delivery   . HTN (hypertension)   . Nerve pain    due to nerve pain brought on by prior right knee surgery   . Noncompliance with medications   . PONV (postoperative nausea and vomiting)     Surgeries: Procedure(s): TOTAL KNEE ARTHROPLASTY on 04/30/2019   Consultants (if any):   Discharged Condition: Improved  Hospital Course: Lori Bennett is an 63 y.o. female who was admitted 04/30/2019 with a diagnosis of Primary osteoarthritis of left knee and went to the operating room on 04/30/2019 and underwent the above named procedures.    She was given perioperative antibiotics:  Anti-infectives (From admission, onward)   Start     Dose/Rate Route Frequency Ordered Stop   04/30/19 1330  ceFAZolin (ANCEF) IVPB 1 g/50 mL premix     1 g 100 mL/hr over 30 Minutes Intravenous Every 6 hours 04/30/19 1108 04/30/19 1952   04/30/19 0600  ceFAZolin (ANCEF) IVPB 2g/100 mL premix     2 g 200 mL/hr over 30 Minutes Intravenous On call to O.R. 04/30/19 0962 04/30/19 0730    .  She was given sequential compression devices, early ambulation, and aspirin for DVT prophylaxis.  She benefited maximally from the hospital stay and there were no complications.    Recent vital signs:  Vitals:   05/01/19 0148 05/01/19 0607  BP: 127/78 131/74  Pulse: 70 71  Resp: 20 20  Temp: (!) 97.4 F (36.3 C)  97.7 F (36.5 C)  SpO2: 100% 94%    Recent laboratory studies:  Lab Results  Component Value Date   HGB 12.4 04/22/2019   HGB 11.7 02/13/2016   Lab Results  Component Value Date   WBC 7.3 04/22/2019   PLT 276 04/22/2019   No results found for: INR Lab Results  Component Value Date   NA 143 04/22/2019   K 3.5 04/22/2019   CL 110 04/22/2019   CO2 25 04/22/2019   BUN 15 04/22/2019   CREATININE 0.66 04/22/2019   GLUCOSE 90 04/22/2019    Discharge Medications:   Allergies as of 05/01/2019      Reactions   Ace Inhibitors Cough   Clonidine Derivatives Other (See Comments)   Severe dry mouth and lethargy.      Medication List    TAKE these medications   acetaminophen 500 MG tablet Commonly known as: TYLENOL Take 2 tablets (1,000 mg total) by mouth every 8 (eight) hours for 10 days. For Pain.   amLODipine 5 MG tablet Commonly known as: NORVASC Take 1 tablet (5 mg total) by mouth 2 (two) times daily. NEED OV.   aspirin EC 81 MG tablet Take 1 tablet (81 mg total) by mouth 2 (two) times daily. For DVT prophylaxis for 30 days after surgery. What changed:   when to take this  additional instructions   carvedilol 12.5 MG tablet Commonly known  as: COREG Take 1 tablet (12.5 mg total) by mouth 2 (two) times daily with a meal. NEED OV.   docusate sodium 100 MG capsule Commonly known as: Colace Take 1 capsule (100 mg total) by mouth 2 (two) times daily. To prevent constipation while taking pain medication.   Farxiga 5 MG Tabs tablet Generic drug: dapagliflozin propanediol Take 5 mg by mouth daily.   gabapentin 300 MG capsule Commonly known as: Neurontin Take 1 capsule (300 mg total) by mouth 2 (two) times daily for 14 days. For 2 weeks post op for pain. What changed:   medication strength  how much to take  when to take this  reasons to take this  additional instructions   hydrALAZINE 25 MG tablet Commonly known as: APRESOLINE Take 1 tablet (25 mg total)  by mouth 2 (two) times daily. NEED OV.   ibuprofen 800 MG tablet Commonly known as: ADVIL Take 1 tablet (800 mg total) by mouth every 8 (eight) hours as needed for up to 14 days for mild pain or moderate pain.   methocarbamol 500 MG tablet Commonly known as: Robaxin Take 1 tablet (500 mg total) by mouth every 8 (eight) hours as needed for muscle spasms.   omeprazole 20 MG capsule Commonly known as: PriLOSEC Take 1 capsule (20 mg total) by mouth daily. 30 days for gastroprotection while taking NSAIDs.   ondansetron 4 MG tablet Commonly known as: Zofran Take 1 tablet (4 mg total) by mouth every 8 (eight) hours as needed for nausea or vomiting.   oxyCODONE 5 MG immediate release tablet Commonly known as: Roxicodone Take 1 tablet (5 mg total) by mouth every 4 (four) hours as needed for breakthrough pain.   Trulicity 0.75 MG/0.5ML Sopn Generic drug: Dulaglutide Inject 0.75 mg into the skin every Friday.   Vitamin D (Ergocalciferol) 1.25 MG (50000 UT) Caps capsule Commonly known as: DRISDOL Take 50,000 Units by mouth every 7 (seven) days. Mondays       Diagnostic Studies: Dg Knee Left Port  Result Date: 04/30/2019 CLINICAL DATA:  63 year old female status post left knee arthroplasty. EXAM: PORTABLE LEFT KNEE - 1-2 VIEW COMPARISON:  None. FINDINGS: AP and cross-table lateral views at 0929 hours. Total left knee arthroplasty hardware in place and appears normally aligned. Postoperative gas in the joint space. No unexpected osseous changes. Hardware appears intact. IMPRESSION: Left total knee arthroplasty with no adverse features. Electronically Signed   By: Odessa FlemingH  Hall M.D.   On: 04/30/2019 10:11    Disposition: Discharge disposition: 01-Home or Self Care     OPPT planned for 05/02/19.   Discharge Instructions    Discharge patient   Complete by: As directed    After cleared by therapy   Discharge disposition: 01-Home or Self Care   Discharge patient date: 05/01/2019       Follow-up Information    Sheral ApleyMurphy, Timothy D, MD.   Specialty: Orthopedic Surgery Contact information: [address] [place] [address] [phone]            Signed: Albina BilletHenry Calvin Martensen III PA-C 05/01/2019, 7:44 AM

## 2019-05-01 NOTE — Plan of Care (Signed)
  Problem: Education: Goal: Knowledge of the prescribed therapeutic regimen will improve Outcome: Adequate for Discharge Goal: Individualized Educational Video(s) Outcome: Adequate for Discharge   Problem: Activity: Goal: Ability to avoid complications of mobility impairment will improve Outcome: Adequate for Discharge Goal: Range of joint motion will improve Outcome: Adequate for Discharge   Problem: Clinical Measurements: Goal: Postoperative complications will be avoided or minimized Outcome: Adequate for Discharge   Problem: Pain Management: Goal: Pain level will decrease with appropriate interventions Outcome: Adequate for Discharge   Problem: Skin Integrity: Goal: Will show signs of wound healing Outcome: Adequate for Discharge  Home with sister today. Discharge teaching done, written information given.

## 2019-05-01 NOTE — Progress Notes (Signed)
Subjective: Patient reports pain as mild.  Tolerating diet.  Urinating.  No CP, SOB.  Early mobilization with therapy, but has not walked the hall yet.  Objective:   VITALS:   Vitals:   04/30/19 1701 04/30/19 2119 05/01/19 0148 05/01/19 0607  BP: 135/82 (!) 145/81 127/78 131/74  Pulse: 73 70 70 71  Resp: 16 18 20 20   Temp: 98 F (36.7 C) 97.8 F (36.6 C) (!) 97.4 F (36.3 C) 97.7 F (36.5 C)  TempSrc:  Oral Oral Oral  SpO2: 94% 100% 100% 94%  Weight:  68.5 kg    Height:  5\' 3"  (1.6 m)     CBC Latest Ref Rng & Units 04/22/2019 02/13/2016  WBC 4.0 - 10.5 K/uL 7.3 6.2  Hemoglobin 12.0 - 15.0 g/dL 12.4 11.7  Hematocrit 36.0 - 46.0 % 40.5 36.7  Platelets 150 - 400 K/uL 276 298   BMP Latest Ref Rng & Units 04/22/2019 08/23/2017 05/18/2017  Glucose 70 - 99 mg/dL 90 103(H) 196(H)  BUN 8 - 23 mg/dL 15 9 9   Creatinine 0.44 - 1.00 mg/dL 0.66 0.56(L) 0.62  BUN/Creat Ratio 12 - 28 - 16 15  Sodium 135 - 145 mmol/L 143 142 142  Potassium 3.5 - 5.1 mmol/L 3.5 3.7 3.4(L)  Chloride 98 - 111 mmol/L 110 105 103  CO2 22 - 32 mmol/L 25 23 23   Calcium 8.9 - 10.3 mg/dL 9.2 9.2 9.4   Intake/Output      07/07 0701 - 07/08 0700 07/08 0701 - 07/09 0700   P.O. 1320    I.V. (mL/kg) 2995.1 (43.7)    IV Piggyback 150    Total Intake(mL/kg) 4465.1 (65.2)    Urine (mL/kg/hr) 3500 (2.1)    Blood 150    Total Output 3650    Net +815.1            Physical Exam: General: NAD.  Upright in bed.  Calm, conversant. Resp: No increased wob Cardio: regular rate and rhythm ABD soft Neurologically intact MSK LLE: Neurovascularly intact Sensation intact distally Intact pulses distally Dorsiflexion/Plantar flexion intact Incision: dressing with small stable area of bloody drainage   Assessment: 1 Day Post-Op  S/P Procedure(s) (LRB): TOTAL KNEE ARTHROPLASTY (Left) by Dr. Ernesta Amble. Percell Miller on 04/30/2019  Principal Problem:   Primary osteoarthritis of left knee Active Problems:   Essential  hypertension   DM2 (diabetes mellitus, type 2) (HCC)   Primary localized osteoarthritis of knee   Primary osteoarthritis, status post left total knee arthroplasty Doing well postop day 1 Eating, drinking, and voiding Pain control Early mobilization- still needs to walk with therapy  Plan: Up with therapy D/C IV fluids Incentive Spirometry Elevate and Apply ice CPM, bone foam  Weight Bearing: Weight Bearing as Tolerated (WBAT)  Dressings: Maintain Mepilex.  Please ensure thigh high TED hose are applied to operative leg prior to discharge. VTE prophylaxis: Aspirin, SCDs, ambulation Dispo: Home today after therapy evaluation.  OPPT planned for 05/02/19.    Patient's anticipated LOS is less than 2 midnights, meeting these requirements: - Younger than 70 - Lives within 1 hour of care - Has a competent adult at home to recover with post-op recover - NO history of  - Chronic pain requiring opiods  - Diabetes  - Coronary Artery Disease  - Heart failure  - Heart attack  - Stroke  - DVT/VTE  - Cardiac arrhythmia  - Respiratory Failure/COPD  - Renal failure  - Anemia  - Advanced Liver disease  Lori KernHenry Calvin Martensen III, PA-C 05/01/2019, 7:40 AM

## 2019-05-01 NOTE — Progress Notes (Signed)
Physical Therapy Treatment Patient Details Name: Lori Bennett MRN: [id number] DOB: 13-Jun-1956 Today's Date: 05/01/2019    History of Present Illness L TKA; PMH R TKA August 2019    PT Comments    Pt ambulated in hallway and practiced one step.  Pt also performed LE exercises and has HEP handout provided yesterday.  Pt had no further questions and feels ready for d/c home today.   Follow Up Recommendations  Follow surgeon's recommendation for DC plan and follow-up therapies;Outpatient PT     Equipment Recommendations  None recommended by PT    Recommendations for Other Services       Precautions / Restrictions Precautions Precautions: Knee Precaution Comments: pt had been up to bathroom several times without KI so performed session without KI Restrictions Other Position/Activity Restrictions: WBAT    Mobility  Bed Mobility               General bed mobility comments: pt up in recliner on arrival  Transfers Overall transfer level: Needs assistance Equipment used: Rolling walker (2 wheeled) Transfers: Sit to/from Stand Sit to Stand: Min guard         General transfer comment: verbal cues for UE and LE positioning  Ambulation/Gait Ambulation/Gait assistance: Min guard Gait Distance (Feet): 160 Feet Assistive device: Rolling walker (2 wheeled) Gait Pattern/deviations: Step-to pattern;Decreased stance time - left;Antalgic     General Gait Details: verbal cues for sequence, RW positioning, step length   Stairs Stairs: Yes Stairs assistance: Min guard Stair Management: Step to pattern;Forwards;With walker Number of Stairs: 1 General stair comments: verbal cues for sequence, safety and technique; pt reports understanding and performed twice   Wheelchair Mobility    Modified Rankin (Stroke Patients Only)       Balance                                            Cognition Arousal/Alertness: Awake/alert Behavior During Therapy:  WFL for tasks assessed/performed Overall Cognitive Status: Within Functional Limits for tasks assessed                                        Exercises Total Joint Exercises Ankle Circles/Pumps: AROM;Both;10 reps;Supine Quad Sets: AROM;Both;10 reps Short Arc QuadSinclair Ship;Left;10 reps Heel Slides: AAROM;Left;10 reps;Seated Hip ABduction/ADduction: AAROM;Left;10 reps Straight Leg Raises: AAROM;Left;10 reps    General Comments        Pertinent Vitals/Pain Pain Assessment: 0-10 Pain Score: 6  Pain Location: L knee Pain Descriptors / Indicators: Sore;Tightness Pain Intervention(s): Monitored during session;Repositioned;Limited activity within patient's tolerance;Ice applied    Home Living                      Prior Function            PT Goals (current goals can now be found in the care plan section) Progress towards PT goals: Progressing toward goals    Frequency           PT Plan Current plan remains appropriate    Co-evaluation              AM-PAC PT "6 Clicks" Mobility   Outcome Measure  Help needed turning from your back to your side while in a flat bed without using bedrails?: A Little  Help needed moving from lying on your back to sitting on the side of a flat bed without using bedrails?: A Little Help needed moving to and from a bed to a chair (including a wheelchair)?: A Little Help needed standing up from a chair using your arms (e.g., wheelchair or bedside chair)?: A Little Help needed to walk in hospital room?: A Little Help needed climbing 3-5 steps with a railing? : A Little 6 Click Score: 18    End of Session Equipment Utilized During Treatment: Gait belt Activity Tolerance: Patient tolerated treatment well Patient left: in chair;with call bell/phone within reach Nurse Communication: Mobility status PT Visit Diagnosis: Muscle weakness (generalized) (M62.81);Difficulty in walking, not elsewhere classified (R26.2)      Time: 1610-96041005-1028 PT Time Calculation (min) (ACUTE ONLY): 23 min  Charges:  $Gait Training: 8-22 mins $Therapeutic Exercise: 8-22 mins                     Zenovia JarredKati Eliyohu Class, PT, DPT Acute Rehabilitation Services Office: [phone] Pager: [phone]  Sarajane JewsLEMYRE,KATHrine E 05/01/2019, 3:27 PM

## 2019-05-07 ENCOUNTER — Other Ambulatory Visit (HOSPITAL_COMMUNITY): Payer: Self-pay | Admitting: Orthopedic Surgery

## 2019-05-07 ENCOUNTER — Other Ambulatory Visit: Payer: Self-pay

## 2019-05-07 ENCOUNTER — Ambulatory Visit (HOSPITAL_COMMUNITY)
Admission: RE | Admit: 2019-05-07 | Discharge: 2019-05-07 | Disposition: A | Discharge status: Home / Self Care | Payer: 59 | Source: Ambulatory Visit | Attending: Cardiology | Admitting: Cardiology

## 2019-05-07 DIAGNOSIS — M7989 Other specified soft tissue disorders: Secondary | ICD-10-CM

## 2019-05-07 DIAGNOSIS — M79605 Pain in left leg: Secondary | ICD-10-CM | POA: Insufficient documentation

## 2020-05-04 ENCOUNTER — Encounter: Payer: Self-pay | Admitting: General Practice

## 2020-06-05 ENCOUNTER — Emergency Department (HOSPITAL_COMMUNITY)
Admission: EM | Admit: 2020-06-05 | Discharge: 2020-06-05 | Disposition: A | Discharge status: Home / Self Care | Attending: Emergency Medicine | Admitting: Emergency Medicine

## 2020-06-05 ENCOUNTER — Encounter (HOSPITAL_COMMUNITY): Payer: Self-pay

## 2020-06-05 ENCOUNTER — Other Ambulatory Visit: Payer: Self-pay

## 2020-06-05 ENCOUNTER — Emergency Department (HOSPITAL_COMMUNITY)

## 2020-06-05 DIAGNOSIS — I10 Essential (primary) hypertension: Secondary | ICD-10-CM | POA: Insufficient documentation

## 2020-06-05 DIAGNOSIS — S61002A Unspecified open wound of left thumb without damage to nail, initial encounter: Secondary | ICD-10-CM | POA: Diagnosis not present

## 2020-06-05 DIAGNOSIS — S61209A Unspecified open wound of unspecified finger without damage to nail, initial encounter: Secondary | ICD-10-CM

## 2020-06-05 DIAGNOSIS — Y9389 Activity, other specified: Secondary | ICD-10-CM | POA: Insufficient documentation

## 2020-06-05 DIAGNOSIS — S60931A Unspecified superficial injury of right thumb, initial encounter: Secondary | ICD-10-CM | POA: Diagnosis present

## 2020-06-05 DIAGNOSIS — W3189XA Contact with other specified machinery, initial encounter: Secondary | ICD-10-CM | POA: Insufficient documentation

## 2020-06-05 DIAGNOSIS — Y999 Unspecified external cause status: Secondary | ICD-10-CM | POA: Diagnosis not present

## 2020-06-05 DIAGNOSIS — Y9289 Other specified places as the place of occurrence of the external cause: Secondary | ICD-10-CM | POA: Diagnosis not present

## 2020-06-05 DIAGNOSIS — E119 Type 2 diabetes mellitus without complications: Secondary | ICD-10-CM | POA: Insufficient documentation

## 2020-06-05 DIAGNOSIS — Z7982 Long term (current) use of aspirin: Secondary | ICD-10-CM | POA: Insufficient documentation

## 2020-06-05 DIAGNOSIS — Z23 Encounter for immunization: Secondary | ICD-10-CM | POA: Insufficient documentation

## 2020-06-05 MED ORDER — TETANUS-DIPHTH-ACELL PERTUSSIS 5-2.5-18.5 LF-MCG/0.5 IM SUSP
0.5000 mL | Freq: Once | INTRAMUSCULAR | Status: AC
  Administered 2020-06-05: 0.5 mL via INTRAMUSCULAR
  Filled 2020-06-05: qty 0.5

## 2020-06-05 NOTE — ED Notes (Signed)
Patient needing a work urine drug screen. Patient had urinated in a specimen cup, but that specimen can not be used for the drug screen. Rene Kocher, [place] assisting with that process/ and paperwork and patient is currently obtaining the specimen.

## 2020-06-05 NOTE — ED Triage Notes (Signed)
Patient states she was at work and a plate on a piece of machinery was stuck and she was attempting to pull it out. When patient pulled her glove off she noticed that the end of her left thumb was pinched off

## 2020-06-05 NOTE — Discharge Instructions (Addendum)
Your tetanus was updated today. You should keep the area clean dry and covered with a nonadherent dressing, please change her dressing twice daily and monitor closely for any signs of infection, you can apply over-the-counter antibiotic ointment.  Make sure if you have to wear gloves at work that your finger is well covered and if your bandage becomes dirty or soiled you should change it more frequently.

## 2020-06-05 NOTE — ED Provider Notes (Signed)
[facility]-EMERGENCY DEPT Provider Note   CSN: [id number] Arrival date & time: 06/05/20  0258     History Chief Complaint  Patient presents with  . thumb injury    Lori Bennett is a 64 y.o. female.  Lori Bennett is a 64 y.o. female who presents to the emergency department for evaluation of an injury to her left thumb.  Just prior to arrival she was working with a piece of machinery at work when one of the strength plates got stuck, she was trying to remove it from the machine when one of the parts clamped down.  She had a hinges cotton tip of her glove, but when she removed her glove she noted that the tip of her thumb had been pulled off and was bleeding.  She applied pressure and was able to get bleeding under control.  She reports minimal pain.  No visible bone or damage to the nail.  Denies any numbness or tingling and has normal range of motion.  She is not sure when her last tetanus vaccine.  No other aggravating or alleviating factors.        Past Medical History:  Diagnosis Date  . Arthritis   . Diabetes mellitus without complication (HCC)    type 2 , does not check cbg at home   . History of blood transfusion 30 years ago   after vaginal delivery   . HTN (hypertension)   . Nerve pain    due to nerve pain brought on by prior right knee surgery   . Noncompliance with medications   . PONV (postoperative nausea and vomiting)     Patient Active Problem List   Diagnosis Date Noted  . Primary localized osteoarthritis of knee 04/30/2019  . Primary osteoarthritis of left knee 11/14/2018  . DM2 (diabetes mellitus, type 2) (HCC) 11/14/2018  . HTN (hypertension)   . Noncompliance with medications   . Essential hypertension 07/29/2010    Past Surgical History:  Procedure Laterality Date  . PARTIAL KNEE ARTHROPLASTY Right 05/30/2018   done at [facility] surgery center by dr tim murphy  . partial knee revision  Right 06/14/2018  . ROTATOR CUFF  REPAIR Right   . TOTAL KNEE ARTHROPLASTY Left 04/30/2019   Procedure: TOTAL KNEE ARTHROPLASTY;  Surgeon: Sheral Apley, MD;  Location: [facility];  Service: Orthopedics;  Laterality: Left;  . TUBAL LIGATION       OB History   No obstetric history on file.     Family History  Problem Relation Age of Onset  . Hypertension Mother     Social History   Tobacco Use  . Smoking status: Never Smoker  . Smokeless tobacco: Never Used  Vaping Use  . Vaping Use: Never used  Substance Use Topics  . Alcohol use: No    Alcohol/week: 0.0 standard drinks  . Drug use: No    Home Medications Prior to Admission medications   Medication Sig Start Date End Date Taking? Authorizing Provider  amLODipine (NORVASC) 5 MG tablet Take 1 tablet (5 mg total) by mouth 2 (two) times daily. NEED OV. 06/18/18   [place], Peter M, MD  aspirin EC 81 MG tablet Take 1 tablet (81 mg total) by mouth 2 (two) times daily. For DVT prophylaxis for 30 days after surgery. 04/30/19   Albina Billet III, PA-C  carvedilol (COREG) 12.5 MG tablet Take 1 tablet (12.5 mg total) by mouth 2 (two) times daily with a meal. NEED OV. 06/18/18  [place], Peter M, MD  dapagliflozin propanediol (FARXIGA) 5 MG TABS tablet Take 5 mg by mouth daily.    [provider]  docusate sodium (COLACE) 100 MG capsule Take 1 capsule (100 mg total) by mouth 2 (two) times daily. To prevent constipation while taking pain medication. 04/30/19   Albina Billet III, PA-C  Dulaglutide (TRULICITY) 0.75 MG/0.5ML SOPN Inject 0.75 mg into the skin every Friday.    [provider]  gabapentin (NEURONTIN) 300 MG capsule Take 1 capsule (300 mg total) by mouth 2 (two) times daily for 14 days. For 2 weeks post op for pain. 04/30/19 05/14/19  Albina Billet III, PA-C  hydrALAZINE (APRESOLINE) 25 MG tablet Take 1 tablet (25 mg total) by mouth 2 (two) times daily. NEED OV. 06/18/18   [place], Peter M, MD  methocarbamol (ROBAXIN) 500 MG tablet  Take 1 tablet (500 mg total) by mouth every 8 (eight) hours as needed for muscle spasms. 04/30/19   Albina Billet III, PA-C  omeprazole (PRILOSEC) 20 MG capsule Take 1 capsule (20 mg total) by mouth daily. 30 days for gastroprotection while taking NSAIDs. 04/30/19   Albina Billet III, PA-C  ondansetron (ZOFRAN) 4 MG tablet Take 1 tablet (4 mg total) by mouth every 8 (eight) hours as needed for nausea or vomiting. 04/30/19   Albina Billet III, PA-C  Vitamin D, Ergocalciferol, (DRISDOL) 1.25 MG (50000 UT) CAPS capsule Take 50,000 Units by mouth every 7 (seven) days. Mondays    [provider]    Allergies    Ace inhibitors and Clonidine derivatives  Review of Systems   Review of Systems  Constitutional: Negative for chills and fever.  Musculoskeletal: Negative for arthralgias and myalgias.  Skin: Positive for wound.  Neurological: Negative for weakness and numbness.    Physical Exam Updated Vital Signs BP (!) 156/84 (BP Location: Right Arm)   Pulse 73   Temp 97.7 F (36.5 C) (Oral)   Resp 16   Ht 5\' 3"  (1.6 m)   Wt 68 kg   SpO2 100%   BMI 26.57 kg/m   Physical Exam Vitals and nursing note reviewed.  Constitutional:      General: She is not in acute distress.    Appearance: Normal appearance. She is well-developed. She is not ill-appearing or diaphoretic.  HENT:     Head: Normocephalic and atraumatic.  Eyes:     General:        Right eye: No discharge.        Left eye: No discharge.  Pulmonary:     Effort: Pulmonary effort is normal. No respiratory distress.  Musculoskeletal:     Comments: Soft tissue from the tip of the left thumb without damage to the nail, no active bleeding, no visible bone, patient with normal range of motion of the finger and normal sensation, good cap refill, no injuries to other fingers.  Skin:    General: Skin is warm and dry.  Neurological:     Mental Status: She is alert and oriented to person, place, and time.       Coordination: Coordination normal.  Psychiatric:        Behavior: Behavior normal.     ED Results / Procedures / Treatments   Labs (all labs ordered are listed, but only abnormal results are displayed) Labs Reviewed - No data to display  EKG None  Radiology DG Finger Thumb Left  Result Date: 06/05/2020 CLINICAL DATA:  Pt states while at  work her glove got caught in the machine when she pulled pack part of her finger was injured EXAM: LEFT THUMB 2+V COMPARISON:  None. FINDINGS: There is no evidence of fracture or dislocation. Moderate degenerative changes. No focal bone lesion. Soft tissue defect at the tip of the thumb. IMPRESSION: No acute osseous abnormality. Soft tissue defect at the tip of the thumb. Electronically Signed   By: Emmaline Kluver M.D.   On: 06/05/2020 09:01    Procedures Procedures (including critical care time)  Medications Ordered in ED Medications  Tdap (BOOSTRIX) injection 0.5 mL (0.5 mLs Intramuscular Given 06/05/20 1020)    ED Course  I have reviewed the triage vital signs and the nursing notes.  Pertinent labs & imaging results that were available during my care of the patient were reviewed by me and considered in my medical decision making (see chart for details).    MDM Rules/Calculators/A&P                         64 year old female presents with avulsion injury to the tip of the left palm, soft tissue involvement but no visible bone, x-ray shows no evidence of fracture or bony abnormality, the hand is neurovascularly intact.  Discussed with patient that because tissue has been avulsed with this is not amenable to repair, but will copiously wash, update tetanus and applying a bulky protective dressing.  Discussed appropriate wound care and return precautions.  Patient expresses understanding and agreement.  Discharged home in good condition.  Final Clinical Impression(s) / ED Diagnoses Final diagnoses:  Avulsion, finger tip, initial encounter     Rx / DC Orders ED Discharge Orders    None       Legrand Rams 06/06/20 1039    Arby Barrette, MD 06/24/20 1355

## 2020-09-08 ENCOUNTER — Ambulatory Visit: Payer: 59 | Attending: Internal Medicine

## 2020-09-08 DIAGNOSIS — Z23 Encounter for immunization: Secondary | ICD-10-CM

## 2020-09-08 NOTE — Progress Notes (Signed)
   Covid-19 Vaccination Clinic  Name:  Dianelly Ferran    MRN: [id number] DOB: 1956-04-05  09/08/2020  Ms. Lamba was observed post Covid-19 immunization for 15 minutes without incident. She was provided with Vaccine Information Sheet and instruction to access the V-Safe system.   Ms. Hunn was instructed to call 911 with any severe reactions post vaccine: Marland Kitchen Difficulty breathing  . Swelling of face and throat  . A fast heartbeat  . A bad rash all over body  . Dizziness and weakness   Immunizations Administered    Name Date Dose VIS Date Route   Pfizer COVID-19 Vaccine 09/08/2020  5:20 PM 0.3 mL 08/12/2020 Intramuscular   Manufacturer: [employer], [employer]   Lot: [id number]   NDC: [address]-6

## 2021-01-07 DIAGNOSIS — Z809 Family history of malignant neoplasm, unspecified: Secondary | ICD-10-CM | POA: Diagnosis not present

## 2021-01-07 DIAGNOSIS — Z7984 Long term (current) use of oral hypoglycemic drugs: Secondary | ICD-10-CM | POA: Diagnosis not present

## 2021-01-07 DIAGNOSIS — Z833 Family history of diabetes mellitus: Secondary | ICD-10-CM | POA: Diagnosis not present

## 2021-01-07 DIAGNOSIS — I10 Essential (primary) hypertension: Secondary | ICD-10-CM | POA: Diagnosis not present

## 2021-01-07 DIAGNOSIS — Z8249 Family history of ischemic heart disease and other diseases of the circulatory system: Secondary | ICD-10-CM | POA: Diagnosis not present

## 2021-01-07 DIAGNOSIS — E119 Type 2 diabetes mellitus without complications: Secondary | ICD-10-CM | POA: Diagnosis not present

## 2021-01-07 DIAGNOSIS — M1990 Unspecified osteoarthritis, unspecified site: Secondary | ICD-10-CM | POA: Diagnosis not present

## 2021-01-07 DIAGNOSIS — Z79899 Other long term (current) drug therapy: Secondary | ICD-10-CM | POA: Diagnosis not present

## 2021-01-22 DIAGNOSIS — K224 Dyskinesia of esophagus: Secondary | ICD-10-CM

## 2021-01-22 HISTORY — DX: Dyskinesia of esophagus: K22.4

## 2021-02-06 ENCOUNTER — Emergency Department (HOSPITAL_COMMUNITY): Payer: Medicare HMO

## 2021-02-06 ENCOUNTER — Observation Stay (HOSPITAL_COMMUNITY)
Admission: EM | Admit: 2021-02-06 | Discharge: 2021-02-09 | Disposition: A | Discharge status: Home / Self Care | Payer: Medicare HMO | Attending: Family Medicine | Admitting: Family Medicine

## 2021-02-06 ENCOUNTER — Other Ambulatory Visit: Payer: Self-pay

## 2021-02-06 ENCOUNTER — Encounter (HOSPITAL_COMMUNITY): Payer: Self-pay | Admitting: Emergency Medicine

## 2021-02-06 DIAGNOSIS — K449 Diaphragmatic hernia without obstruction or gangrene: Secondary | ICD-10-CM | POA: Diagnosis not present

## 2021-02-06 DIAGNOSIS — Z794 Long term (current) use of insulin: Secondary | ICD-10-CM | POA: Insufficient documentation

## 2021-02-06 DIAGNOSIS — I10 Essential (primary) hypertension: Secondary | ICD-10-CM | POA: Insufficient documentation

## 2021-02-06 DIAGNOSIS — J029 Acute pharyngitis, unspecified: Secondary | ICD-10-CM

## 2021-02-06 DIAGNOSIS — Z20822 Contact with and (suspected) exposure to covid-19: Secondary | ICD-10-CM | POA: Diagnosis not present

## 2021-02-06 DIAGNOSIS — R1310 Dysphagia, unspecified: Secondary | ICD-10-CM | POA: Diagnosis not present

## 2021-02-06 DIAGNOSIS — J0100 Acute maxillary sinusitis, unspecified: Secondary | ICD-10-CM | POA: Diagnosis not present

## 2021-02-06 DIAGNOSIS — E119 Type 2 diabetes mellitus without complications: Secondary | ICD-10-CM | POA: Insufficient documentation

## 2021-02-06 DIAGNOSIS — R0600 Dyspnea, unspecified: Secondary | ICD-10-CM | POA: Diagnosis not present

## 2021-02-06 DIAGNOSIS — K219 Gastro-esophageal reflux disease without esophagitis: Secondary | ICD-10-CM

## 2021-02-06 DIAGNOSIS — R079 Chest pain, unspecified: Secondary | ICD-10-CM | POA: Diagnosis not present

## 2021-02-06 DIAGNOSIS — R0989 Other specified symptoms and signs involving the circulatory and respiratory systems: Secondary | ICD-10-CM

## 2021-02-06 DIAGNOSIS — R0602 Shortness of breath: Secondary | ICD-10-CM

## 2021-02-06 DIAGNOSIS — J384 Edema of larynx: Secondary | ICD-10-CM | POA: Diagnosis not present

## 2021-02-06 DIAGNOSIS — J320 Chronic maxillary sinusitis: Secondary | ICD-10-CM | POA: Diagnosis not present

## 2021-02-06 DIAGNOSIS — J392 Other diseases of pharynx: Secondary | ICD-10-CM | POA: Diagnosis not present

## 2021-02-06 DIAGNOSIS — Z79899 Other long term (current) drug therapy: Secondary | ICD-10-CM | POA: Diagnosis not present

## 2021-02-06 DIAGNOSIS — I517 Cardiomegaly: Secondary | ICD-10-CM | POA: Diagnosis not present

## 2021-02-06 DIAGNOSIS — J9811 Atelectasis: Secondary | ICD-10-CM | POA: Diagnosis not present

## 2021-02-06 DIAGNOSIS — R062 Wheezing: Secondary | ICD-10-CM | POA: Diagnosis not present

## 2021-02-06 LAB — BASIC METABOLIC PANEL
Anion gap: 11 (ref 5–15)
Anion gap: 11 (ref 5–15)
BUN: <5 mg/dL — ABNORMAL LOW (ref 8–23)
BUN: 6 mg/dL — ABNORMAL LOW (ref 8–23)
CO2: 23 mmol/L (ref 22–32)
CO2: 21 mmol/L — ABNORMAL LOW (ref 22–32)
Calcium: 9.3 mg/dL (ref 8.9–10.3)
Calcium: 9.5 mg/dL (ref 8.9–10.3)
Chloride: 108 mmol/L (ref 98–111)
Chloride: 108 mmol/L (ref 98–111)
Creatinine, Ser: 0.66 mg/dL (ref 0.44–1.00)
Creatinine, Ser: 0.77 mg/dL (ref 0.44–1.00)
GFR, Estimated: >60 mL/min (ref >60)
GFR, Estimated: >60 mL/min (ref >60)
Glucose, Bld: 161 mg/dL — ABNORMAL HIGH (ref 70–99)
Glucose, Bld: 209 mg/dL — ABNORMAL HIGH (ref 70–99)
Potassium: 2.7 mmol/L — CL (ref 3.5–5.1)
Potassium: 3.6 mmol/L (ref 3.5–5.1)
Sodium: 142 mmol/L (ref 135–145)
Sodium: 140 mmol/L (ref 135–145)

## 2021-02-06 LAB — HEPATIC FUNCTION PANEL
ALT: 23 U/L (ref 0–44)
AST: 21 U/L (ref 15–41)
Albumin: 3.6 g/dL (ref 3.5–5.0)
Alkaline Phosphatase: 108 U/L (ref 38–126)
Bilirubin, Direct: 0.2 mg/dL (ref 0.0–0.2)
Indirect Bilirubin: 0.4 mg/dL (ref 0.3–0.9)
Total Bilirubin: 0.6 mg/dL (ref 0.3–1.2)
Total Protein: 7.3 g/dL (ref 6.5–8.1)

## 2021-02-06 LAB — CBC
HCT: 41.4 % (ref 36.0–46.0)
Hemoglobin: 13.2 g/dL (ref 12.0–15.0)
MCH: 28.9 pg (ref 26.0–34.0)
MCHC: 31.9 g/dL (ref 30.0–36.0)
MCV: 90.6 fL (ref 80.0–100.0)
Platelets: 283 10*3/uL (ref 150–400)
RBC: 4.57 MIL/uL (ref 3.87–5.11)
RDW: 13.1 % (ref 11.5–15.5)
WBC: 15.3 10*3/uL — ABNORMAL HIGH (ref 4.0–10.5)
nRBC: 0 % (ref 0.0–0.2)

## 2021-02-06 LAB — RESP PANEL BY RT-PCR (FLU A&B, COVID) ARPGX2
Influenza A by PCR: NEGATIVE
Influenza B by PCR: NEGATIVE
SARS Coronavirus 2 by RT PCR: NEGATIVE

## 2021-02-06 LAB — HIV ANTIBODY (ROUTINE TESTING W REFLEX): HIV Screen 4th Generation wRfx: NONREACTIVE

## 2021-02-06 LAB — TSH: TSH: 0.496 u[IU]/mL (ref 0.350–4.500)

## 2021-02-06 LAB — GLUCOSE, CAPILLARY
Glucose-Capillary: 179 mg/dL — ABNORMAL HIGH (ref 70–99)
Glucose-Capillary: 234 mg/dL — ABNORMAL HIGH (ref 70–99)

## 2021-02-06 LAB — TROPONIN I (HIGH SENSITIVITY)
Troponin I (High Sensitivity): 6 ng/L (ref <18)
Troponin I (High Sensitivity): 6 ng/L (ref <18)

## 2021-02-06 LAB — MAGNESIUM: Magnesium: 1.7 mg/dL (ref 1.7–2.4)

## 2021-02-06 LAB — BRAIN NATRIURETIC PEPTIDE: B Natriuretic Peptide: 153.3 pg/mL — ABNORMAL HIGH (ref 0.0–100.0)

## 2021-02-06 MED ORDER — LORAZEPAM 2 MG/ML IJ SOLN
1.0000 mg | Freq: Once | INTRAMUSCULAR | Status: AC
  Administered 2021-02-06: 1 mg via INTRAVENOUS
  Filled 2021-02-06: qty 1

## 2021-02-06 MED ORDER — POTASSIUM CHLORIDE 20 MEQ PO PACK
40.0000 meq | PACK | Freq: Once | ORAL | Status: AC
  Administered 2021-02-06: 40 meq via ORAL
  Filled 2021-02-06: qty 2

## 2021-02-06 MED ORDER — GLUCAGON HCL RDNA (DIAGNOSTIC) 1 MG IJ SOLR
1.0000 mg | Freq: Once | INTRAMUSCULAR | Status: AC
  Administered 2021-02-06: 1 mg via INTRAVENOUS
  Filled 2021-02-06: qty 1

## 2021-02-06 MED ORDER — ACETAMINOPHEN 650 MG RE SUPP: 650.0000 mg | Freq: Four times a day (QID) | RECTAL | Status: DC | PRN

## 2021-02-06 MED ORDER — HYDRALAZINE HCL 25 MG PO TABS
25.0000 mg | ORAL_TABLET | Freq: Two times a day (BID) | ORAL | Status: DC
  Administered 2021-02-06 – 2021-02-09 (×6): 25 mg via ORAL
  Filled 2021-02-06 (×6): qty 1

## 2021-02-06 MED ORDER — VITAMIN D (ERGOCALCIFEROL) 1.25 MG (50000 UNIT) PO CAPS
50000.0000 [IU] | ORAL_CAPSULE | ORAL | Status: DC
  Administered 2021-02-06: 50000 [IU] via ORAL
  Filled 2021-02-06: qty 1

## 2021-02-06 MED ORDER — CARVEDILOL 12.5 MG PO TABS
12.5000 mg | ORAL_TABLET | Freq: Two times a day (BID) | ORAL | Status: DC
  Administered 2021-02-06 – 2021-02-08 (×5): 12.5 mg via ORAL
  Filled 2021-02-06 (×5): qty 4

## 2021-02-06 MED ORDER — IOHEXOL 350 MG/ML SOLN
80.0000 mL | Freq: Once | INTRAVENOUS | Status: AC | PRN
  Administered 2021-02-06: 80 mL via INTRAVENOUS

## 2021-02-06 MED ORDER — SODIUM CHLORIDE 0.9 % IV BOLUS
500.0000 mL | Freq: Once | INTRAVENOUS | Status: AC
  Administered 2021-02-06: 500 mL via INTRAVENOUS

## 2021-02-06 MED ORDER — INSULIN ASPART 100 UNIT/ML ~~LOC~~ SOLN
0.0000 [IU] | Freq: Three times a day (TID) | SUBCUTANEOUS | Status: DC
  Administered 2021-02-07 – 2021-02-08 (×4): 2 [IU] via SUBCUTANEOUS

## 2021-02-06 MED ORDER — NITROGLYCERIN 0.4 MG SL SUBL
0.4000 mg | SUBLINGUAL_TABLET | SUBLINGUAL | Status: DC | PRN
  Administered 2021-02-06 (×3): 0.4 mg via SUBLINGUAL
  Filled 2021-02-06: qty 1

## 2021-02-06 MED ORDER — POTASSIUM CHLORIDE 10 MEQ/100ML IV SOLN
10.0000 meq | INTRAVENOUS | Status: AC
  Administered 2021-02-06 (×2): 10 meq via INTRAVENOUS
  Filled 2021-02-06 (×2): qty 100

## 2021-02-06 MED ORDER — LIDOCAINE VISCOUS HCL 2 % MT SOLN
15.0000 mL | Freq: Once | OROMUCOSAL | Status: DC
  Filled 2021-02-06: qty 15

## 2021-02-06 MED ORDER — ALUM & MAG HYDROXIDE-SIMETH 200-200-20 MG/5ML PO SUSP
30.0000 mL | Freq: Once | ORAL | Status: DC
  Filled 2021-02-06: qty 30

## 2021-02-06 MED ORDER — ACETAMINOPHEN 325 MG PO TABS: 650.0000 mg | ORAL_TABLET | Freq: Four times a day (QID) | ORAL | Status: DC | PRN

## 2021-02-06 MED ORDER — ENOXAPARIN SODIUM 40 MG/0.4ML ~~LOC~~ SOLN
40.0000 mg | SUBCUTANEOUS | Status: DC
  Administered 2021-02-06 – 2021-02-07 (×2): 40 mg via SUBCUTANEOUS
  Filled 2021-02-06: qty 0.4

## 2021-02-06 MED ORDER — FAMOTIDINE 20 MG PO TABS
20.0000 mg | ORAL_TABLET | Freq: Two times a day (BID) | ORAL | Status: DC
  Administered 2021-02-06 – 2021-02-08 (×4): 20 mg via ORAL
  Filled 2021-02-06 (×5): qty 1

## 2021-02-06 MED ORDER — AMLODIPINE BESYLATE 5 MG PO TABS
5.0000 mg | ORAL_TABLET | Freq: Two times a day (BID) | ORAL | Status: DC
  Administered 2021-02-06 – 2021-02-09 (×6): 5 mg via ORAL
  Filled 2021-02-06 (×7): qty 1

## 2021-02-06 MED ORDER — PENICILLIN V POTASSIUM 250 MG PO TABS
500.0000 mg | ORAL_TABLET | Freq: Three times a day (TID) | ORAL | Status: DC
  Administered 2021-02-06 – 2021-02-09 (×7): 500 mg via ORAL
  Filled 2021-02-06 (×10): qty 2

## 2021-02-06 MED ORDER — DEXAMETHASONE SODIUM PHOSPHATE 10 MG/ML IJ SOLN
10.0000 mg | Freq: Once | INTRAMUSCULAR | Status: AC
  Administered 2021-02-06: 10 mg via INTRAVENOUS
  Filled 2021-02-06: qty 1

## 2021-02-06 NOTE — ED Triage Notes (Signed)
Pt states she was out with her sister and was sitting down talking at 4am and started feeling like she was getting choked.  Not eating or drinking anything.  Reports SOB.  Denies pain.

## 2021-02-06 NOTE — Evaluation (Signed)
EKG completed.  Placed in patient's chart

## 2021-02-06 NOTE — H&P (Addendum)
Family Medicine [facility] Admission History and Physical Service Pager: [phone]  Patient name: Lori FlavinCheryl Bennett Medical record number: [id number] Date of birth: July 29, 1956 Age: 65 y.o. Gender: female  Primary Care Provider: Luciano CutterBujanowski, Melissa, PA-C Consultants: none Code Status: Full Emergency contact - Felipa EmorySherod Buchan (sister) -  [phone]  Chief Complaint: dysphagia  Assessment and Plan: Lori Bennett is a 65 y.o. female presenting with acute onset dysphagia and dyspnea. PMH is significant for HTN, T2DM  Dysphagia with intermittent episodes of choking sensation Patient with acute onset of dysphagia with multiple episodes of gagging and dyspnea since early this morning.  These episodes do not correlate with the patient taking in food or fluids.  Clinically stable at this time without oxygen desaturation during episodes of gagging, to which were witnessed during evaluation.  No evidence of PE on CTA. CT soft tissue neck without evidence of airway obstruction, but did note evidence of possible pharyngitis versus tonsillitis.  She did have some right tonsillar hypertrophy on physical exam not occluding the airway and leukocytosis to support pharyngitis or tonsillitis, though unclear if tonsillitis would explain the entire clinical picture.  Patient endorses the choking sensation being much lower in her throat, close to her chest.  These episodes only seem to last around 30 seconds and then resolved.  Unclear if this is MSK versus esophageal versus laryngeal in etiology.  Patient's last meal about 12 hours ago did include fish though she does not endorse having any bones, however if she had something such as this irritating her esophagus this could explain these spasmodic type choking sensations.  No retropharyngeal fluid collection on CT.  She endorses the sensation occurring just below the level of the thyroid which does not feel enlarged on physical exam and was unremarkable on CT.   Differential at this time includes: Foreign body/food impaction (though last meal was 12 hours prior), esophageal spasm, laryngeal spasm, GERD (though denies heartburn symptoms), esophageal stricture, thyroid mass (though none seen on CT).  Will admit for further work-up and management. - admit to med-tele, Dr. Lum BabeEniola attending - labs: TSH, BMP - start famotine 20 mg BID - SLP - vitals per unit routine - ENT consulted, will see tomorrow -Continue current monitoring -Continuous pulse ox -We will start with clear liquid diet and advance as patient tolerates - strep PCR -After discussing patient with our attending we will treat empirically for with patient having an elevated white blood cell count and the CT findings.  Will initiate penicillin, oral if patient is able to tolerate.  We will follow-up on the strep PCR as discussed above.  Hypokalemia Potassium of 2.7 on admission s/p 2 rounds of IV potassium in the ED. Possible U waves seen on EKG. ?U waves on the EKG.  - KCl packet 40 mEq - BMP at 1700 - will repeat EKG  Tachycardia/EKG changes Mild persistent sinus tachycardia so far this admission.  No evidence of PE on CTA.  Possibly due to not receiving her beta-blocker this morning (she did not take any of her medications this morning).  EKG with some diffuse ST depressions and ?U waves which were not present on her last EKG about 1 year ago and may be explained by her hypokalemia.  Patient with no complaints of chest pain.  Troponins negative at this time. - TSH - restart antihypertensives as below -Continuous cardiac monitoring  HTN Mildly hypertensive throughout admission mostly in the 140s to 160s systolic as high as 166/95.  Suspect likely secondary to  not taking her antihypertensives this morning. Home medications include carvedilol 12.5 mg twice daily, hydralazine 25 mg twice daily, amlodipine 5 mg twice daily. - continue home medications  T2DM Glucose 161 on admission. Home  medications include empagliflozin 5 mg daily, dulaglutide 0.75 mg weekly on Fridays. - A1c - hold home medications - sSSI - monitor CBG  Lower extremity swelling: Patient endorses chronic lower extremity swelling worse in the left which started after her knee replacement.  She states that her swelling is at baseline at this time.  She also takes amlodipine which may be the cause of this.  She has never had an echocardiogram. -Follow-up BNP - consider TTE if BNP elevated  FEN/GI: Clear liquid diet, advance as tolerated Prophylaxis: LMWH  Disposition: Med-telemetry  History of Present Illness:  Lori Bennett is a 65 y.o. female presenting with choking sensation and shortness of breath.   First choking sensation was around 3:30am or so, she says she's had 3-4 other episodes of it since then, though sister states that she has had more than 4 episodes. She gets a bout of coughing and then feels like she needs to vomit but has not vomited anything up.  She also reports globus sensation, stating that she feels like she's choking on something in her mid throat.  She says earlier she tried some hot tea to see if the steam would help "open her up" but that didn't help.  She denies fevers, denies history of reflux. States she had some fried fish yesterday evening around 3:00 PM. She didn't note any bones in the fish. She didn't start having symptoms until about 12 hours later.  On presentation to the ED, patient was afebrile, tachypnea, and mildly tachycardic with mild hypertension and with adequate SpO2 on room air.  EKG obtained with ST and T wave abnormalities in the inferolateral leads, though troponins obtained were negative.  Labs overall unremarkable except for leukocytosis with WBC 15.3 and hypokalemia with potassium 2.7 which was treated with IV KCl 10 mEq x2 in the ED.  CTA chest was obtained without evidence of PE.  Case was discussed with ENT in the ED and recommended obtaining a CT of the  neck, which showed evidence of pharyngitis and tonsillitis without peritonsillar abscess or significant airway effacement.  Episode of gagging/gasping was witnessed by the ED provider and did note some stridor then, so we were consulted for admission.   Review Of Systems: Per HPI with the following additions:   Review of Systems  Constitutional: Negative for chills and fever.  HENT: Negative for congestion.   Respiratory: Positive for cough and shortness of breath.   Cardiovascular: Negative for chest pain.  Gastrointestinal: Negative for abdominal pain, heartburn, nausea and vomiting.    Patient Active Problem List   Diagnosis Date Noted  . Primary localized osteoarthritis of knee 04/30/2019  . Primary osteoarthritis of left knee 11/14/2018  . DM2 (diabetes mellitus, type 2) (HCC) 11/14/2018  . HTN (hypertension)   . Noncompliance with medications   . Essential hypertension 07/29/2010    Past Medical History: Past Medical History:  Diagnosis Date  . Arthritis   . Diabetes mellitus without complication (HCC)    type 2 , does not check cbg at home   . History of blood transfusion 30 years ago   after vaginal delivery   . HTN (hypertension)   . Nerve pain    due to nerve pain brought on by prior right knee surgery   . Noncompliance  with medications   . PONV (postoperative nausea and vomiting)     Past Surgical History: Past Surgical History:  Procedure Laterality Date  . PARTIAL KNEE ARTHROPLASTY Right 05/30/2018   done at [facility] surgery center by dr tim murphy  . partial knee revision  Right 06/14/2018  . ROTATOR CUFF REPAIR Right   . TOTAL KNEE ARTHROPLASTY Left 04/30/2019   Procedure: TOTAL KNEE ARTHROPLASTY;  Surgeon: Sheral Apley, MD;  Location: [facility];  Service: Orthopedics;  Laterality: Left;  . TUBAL LIGATION      Social History: Social History   Tobacco Use  . Smoking status: Never Smoker  . Smokeless tobacco: Never Used  Vaping Use  . Vaping  Use: Never used  Substance Use Topics  . Alcohol use: No    Alcohol/week: 0.0 standard drinks  . Drug use: No   Additional social history: Denies smoking history, alcohol use, and recreational drug use. Please also refer to relevant sections of EMR.  Family History: Family History  Problem Relation Age of Onset  . Hypertension Mother     Allergies and Medications: Allergies  Allergen Reactions  . Ace Inhibitors Cough  . Clonidine Derivatives Other (See Comments)    Severe dry mouth and lethargy.   No current facility-administered medications on file prior to encounter.   Current Outpatient Medications on File Prior to Encounter  Medication Sig Dispense Refill  . amLODipine (NORVASC) 5 MG tablet Take 1 tablet (5 mg total) by mouth 2 (two) times daily. NEED OV. 180 tablet 0  . aspirin EC 81 MG tablet Take 1 tablet (81 mg total) by mouth 2 (two) times daily. For DVT prophylaxis for 30 days after surgery. 60 tablet 0  . carvedilol (COREG) 12.5 MG tablet Take 1 tablet (12.5 mg total) by mouth 2 (two) times daily with a meal. NEED OV. 180 tablet 0  . dapagliflozin propanediol (FARXIGA) 5 MG TABS tablet Take 5 mg by mouth daily.    Marland Kitchen docusate sodium (COLACE) 100 MG capsule Take 1 capsule (100 mg total) by mouth 2 (two) times daily. To prevent constipation while taking pain medication. 30 capsule 0  . Dulaglutide (TRULICITY) 0.75 MG/0.5ML SOPN Inject 0.75 mg into the skin every Friday.    . gabapentin (NEURONTIN) 300 MG capsule Take 1 capsule (300 mg total) by mouth 2 (two) times daily for 14 days. For 2 weeks post op for pain. 28 capsule 0  . hydrALAZINE (APRESOLINE) 25 MG tablet Take 1 tablet (25 mg total) by mouth 2 (two) times daily. NEED OV. 180 tablet 0  . methocarbamol (ROBAXIN) 500 MG tablet Take 1 tablet (500 mg total) by mouth every 8 (eight) hours as needed for muscle spasms. 20 tablet 0  . omeprazole (PRILOSEC) 20 MG capsule Take 1 capsule (20 mg total) by mouth daily. 30 days  for gastroprotection while taking NSAIDs. 30 capsule 0  . ondansetron (ZOFRAN) 4 MG tablet Take 1 tablet (4 mg total) by mouth every 8 (eight) hours as needed for nausea or vomiting. 20 tablet 0  . Vitamin D, Ergocalciferol, (DRISDOL) 1.25 MG (50000 UT) CAPS capsule Take 50,000 Units by mouth every 7 (seven) days. Mondays      Objective: BP (!) 152/109 (BP Location: Right Arm)   Pulse (!) 111   Temp 99.1 F (37.3 C) (Oral)   Resp (!) 27   SpO2 97%  Exam: General: Elderly female sitting up in bed comfortably, 2 choking episodes witnessed during encounter not associated with eating  and without oxygen desaturation, NAD Eyes: EOMI ENTM: Posterior oropharynx mildly erythematous, right tonsillar hypertrophy without exudates Neck: supple, non-tender anterior cervical lymph node palpated on the left Cardiovascular: Tachycardic, regular rhythm, no murmurs Respiratory: CTAB, mildly tachypneic, no stridor noted during choking episode Gastrointestinal: soft, non-tender, +BS MSK: neck with full ROM without pain, trace lower extremity edema worse on left Derm: warm, dry Neuro: alert, interactive Psych: affect appropriate  Labs and Imaging: CBC BMET  Recent Labs  Lab 02/06/21 0803  WBC 15.3*  HGB 13.2  HCT 41.4  PLT 283   Recent Labs  Lab 02/06/21 0803  NA 142  K 2.7*  CL 108  CO2 23  BUN <5*  CREATININE 0.66  GLUCOSE 161*  CALCIUM 9.3       Littie Deeds, MD 02/06/2021, 2:19 PM PGY-1, [facility] FPTS Intern pager: [phone], text pages welcome   Upper Level Addendum:  I have seen and evaluated this patient along with Dr. Wynelle Link and reviewed the above note, making necessary revisions as appropriate.  I agree with the medical decision making and physical exam as noted above.  Jackelyn Poling, DO PGY-2  [facility] Residency

## 2021-02-06 NOTE — ED Provider Notes (Signed)
I provided a substantive portion of the care of this patient.  I personally performed the entirety of the medical decision making for this encounter.  EKG Interpretation  Date/Time:  Saturday February 06 2021 07:28:19 EDT Ventricular Rate:  110 PR Interval:  170 QRS Duration: 88 QT Interval:  314 QTC Calculation: 424 R Axis:   24 Text Interpretation: Sinus tachycardia Possible Left atrial enlargement ST & T wave abnormality, consider inferior ischemia ST & T wave abnormality, consider anterolateral ischemia Abnormal ECG changes are new when compared to prior Confirmed by Lorre Nick ([id number]) on 02/06/2021 8:55:69 AM 65 year old female presents with acute onset of shortness of breath while at rest.  Patient had trouble catching her breath.  EKG shows inferior lateral ischemia.  Hypokalemia noted on electrolytes and for troponin negative.  Will order PE study and patient likely to be admitted   Lorre Nick, MD 02/06/21 [phone]

## 2021-02-06 NOTE — ED Notes (Signed)
Immediately before glucagon given, patient sat straight up on stretcher to tripod position and grabbed my arm. Patient unable to get air in or out and not able to speak. PA to bedside and patient placed on nonrebreather. No desat noted and airway relaxed with the flow of the O2 for her to take a deep breath. Following episode, patient's voice still slightly hoarse and she is feeling a pressure in upper third of sternum that is accompanied by a feeling that she needs to gag, but can't. O2 removed and VSS. Tachycardia and tachypnea noted. PA aware. Patient on all monitors and sister called on her cell phone and coming to bedside. Call bell in her right hand. Patient instructed to sit upright at this time.

## 2021-02-06 NOTE — ED Provider Notes (Signed)
[facility] EMERGENCY DEPARTMENT Provider Note   CSN: [id number] Arrival date & time: 02/06/21  4098     History Chief Complaint  Patient presents with  . Shortness of Breath    Lori Bennett is a 65 y.o. female with pertinent past medical history of hypertension, and diabetes who presents the emerge department today for shortness of breath.  Patient states that she was sitting down with her sister talking around 4 AM and then started feeling as if she was choked.  States that she could not catch her breath and she kept feeling the sensation to burp.  Has now been occurring since then intermittently for the last 4-1/2 hours.  Does not report eating anything since earlier that night, denies having anything stuck or putting down her throat.  Patient states that she feels as if something is stuck in her throat.  States that she tried to drink hot tea this morning, however was difficult, felt as if she was being choked.  Denies any coughing or aspiration.  Denies any chest pain.  States that she feels as if she cannot catch her breath due to something being stuck in her throat, however did not eat anything.  Denies any myalgias, leg swelling, palpitations.  States that she was in her normal health before this.  Has never had anything like this before.  Denies any GERD.  Denies any abdominal pain back pain nausea or vomiting or diaphoresis. No ST, cough, congestion.   HPI     Past Medical History:  Diagnosis Date  . Arthritis   . Diabetes mellitus without complication (HCC)    type 2 , does not check cbg at home   . History of blood transfusion 30 years ago   after vaginal delivery   . HTN (hypertension)   . Nerve pain    due to nerve pain brought on by prior right knee surgery   . Noncompliance with medications   . PONV (postoperative nausea and vomiting)     Patient Active Problem List   Diagnosis Date Noted  . Dyspnea 02/06/2021  . Dysphagia 02/06/2021  . Primary  localized osteoarthritis of knee 04/30/2019  . Primary osteoarthritis of left knee 11/14/2018  . DM2 (diabetes mellitus, type 2) (HCC) 11/14/2018  . HTN (hypertension)   . Noncompliance with medications   . Essential hypertension 07/29/2010    Past Surgical History:  Procedure Laterality Date  . PARTIAL KNEE ARTHROPLASTY Right 05/30/2018   done at [facility] surgery center by dr tim murphy  . partial knee revision  Right 06/14/2018  . ROTATOR CUFF REPAIR Right   . TOTAL KNEE ARTHROPLASTY Left 04/30/2019   Procedure: TOTAL KNEE ARTHROPLASTY;  Surgeon: Sheral Apley, MD;  Location: [facility] ORS;  Service: Orthopedics;  Laterality: Left;  . TUBAL LIGATION       OB History   No obstetric history on file.     Family History  Problem Relation Age of Onset  . Hypertension Mother     Social History   Tobacco Use  . Smoking status: Never Smoker  . Smokeless tobacco: Never Used  Vaping Use  . Vaping Use: Never used  Substance Use Topics  . Alcohol use: No    Alcohol/week: 0.0 standard drinks  . Drug use: No    Home Medications Prior to Admission medications   Medication Sig Start Date End Date Taking? Authorizing Provider  amLODipine (NORVASC) 5 MG tablet Take 1 tablet (5 mg total) by mouth 2 (  two) times daily. NEED OV. 06/18/18   [place], Peter M, MD  aspirin EC 81 MG tablet Take 1 tablet (81 mg total) by mouth 2 (two) times daily. For DVT prophylaxis for 30 days after surgery. 04/30/19   Albina Billet III, PA-C  carvedilol (COREG) 12.5 MG tablet Take 1 tablet (12.5 mg total) by mouth 2 (two) times daily with a meal. NEED OV. 06/18/18   [place], Peter M, MD  dapagliflozin propanediol (FARXIGA) 5 MG TABS tablet Take 5 mg by mouth daily.    [provider]  docusate sodium (COLACE) 100 MG capsule Take 1 capsule (100 mg total) by mouth 2 (two) times daily. To prevent constipation while taking pain medication. 04/30/19   Albina Billet III, PA-C  Dulaglutide  (TRULICITY) 0.75 MG/0.5ML SOPN Inject 0.75 mg into the skin every Friday.    [provider]  gabapentin (NEURONTIN) 300 MG capsule Take 1 capsule (300 mg total) by mouth 2 (two) times daily for 14 days. For 2 weeks post op for pain. 04/30/19 05/14/19  Albina Billet III, PA-C  hydrALAZINE (APRESOLINE) 25 MG tablet Take 1 tablet (25 mg total) by mouth 2 (two) times daily. NEED OV. 06/18/18   [place], Peter M, MD  methocarbamol (ROBAXIN) 500 MG tablet Take 1 tablet (500 mg total) by mouth every 8 (eight) hours as needed for muscle spasms. 04/30/19   Albina Billet III, PA-C  omeprazole (PRILOSEC) 20 MG capsule Take 1 capsule (20 mg total) by mouth daily. 30 days for gastroprotection while taking NSAIDs. 04/30/19   Albina Billet III, PA-C  ondansetron (ZOFRAN) 4 MG tablet Take 1 tablet (4 mg total) by mouth every 8 (eight) hours as needed for nausea or vomiting. 04/30/19   Albina Billet III, PA-C  Vitamin D, Ergocalciferol, (DRISDOL) 1.25 MG (50000 UT) CAPS capsule Take 50,000 Units by mouth every 7 (seven) days. Mondays    [provider]    Allergies    Ace inhibitors and Clonidine derivatives  Review of Systems   Review of Systems  Constitutional: Negative for chills, diaphoresis, fatigue and fever.  HENT: Negative for congestion, sore throat and trouble swallowing.   Eyes: Negative for pain and visual disturbance.  Respiratory: Positive for shortness of breath. Negative for cough, wheezing and stridor.   Cardiovascular: Negative for chest pain, palpitations and leg swelling.  Gastrointestinal: Negative for abdominal distention, abdominal pain, diarrhea, nausea and vomiting.  Genitourinary: Negative for difficulty urinating.  Musculoskeletal: Negative for back pain, neck pain and neck stiffness.  Skin: Negative for pallor.  Neurological: Negative for dizziness, speech difficulty, weakness and headaches.  Psychiatric/Behavioral: Negative for  confusion.    Physical Exam Updated Vital Signs BP (!) 149/90 (BP Location: Left Arm)   Pulse (!) 105   Temp 99.4 F (37.4 C) (Oral)   Resp 17   SpO2 99%   Physical Exam Constitutional:      General: She is in acute distress.     Appearance: Normal appearance. She is not ill-appearing, toxic-appearing or diaphoretic.  HENT:     Mouth/Throat:     Mouth: Mucous membranes are moist.     Pharynx: Oropharynx is clear.     Comments: Uvula midline, no swelling.  No swelling under tongue.  Handling secretions well.  No trismus. Eyes:     General: No scleral icterus.    Extraocular Movements: Extraocular movements intact.     Pupils: Pupils are equal, round, and reactive to light.  Cardiovascular:  Rate and Rhythm: Regular rhythm. Tachycardia present.     Pulses: Normal pulses.     Heart sounds: Normal heart sounds.  Pulmonary:     Effort: Pulmonary effort is normal. No respiratory distress.     Breath sounds: Normal breath sounds. No stridor. No wheezing, rhonchi or rales.     Comments: Patient will have episodes where she cannot catch her breath, tries to stop breath on top of each other making her more short of breath, no stridor.  Will then have episodes of normality, no sensory muscle use or respiratory distress. Chest:     Chest wall: No tenderness.  Abdominal:     General: Abdomen is flat. There is no distension.     Palpations: Abdomen is soft.     Tenderness: There is no abdominal tenderness. There is no guarding or rebound.  Musculoskeletal:        General: No swelling or tenderness. Normal range of motion.     Cervical back: Normal range of motion and neck supple. No rigidity.     Right lower leg: No edema.     Left lower leg: No edema.  Skin:    General: Skin is warm and dry.     Capillary Refill: Capillary refill takes less than 2 seconds.     Coloration: Skin is not pale.  Neurological:     General: No focal deficit present.     Mental Status: She is alert  and oriented to person, place, and time.  Psychiatric:        Mood and Affect: Mood normal.        Behavior: Behavior normal.     ED Results / Procedures / Treatments   Labs (all labs ordered are listed, but only abnormal results are displayed) Labs Reviewed  BASIC METABOLIC PANEL - Abnormal; Notable for the following components:      Result Value   Potassium 2.7 (*)    Glucose, Bld 161 (*)    BUN <5 (*)    All other components within normal limits  CBC - Abnormal; Notable for the following components:   WBC 15.3 (*)    All other components within normal limits  RESP PANEL BY RT-PCR (FLU A&B, COVID) ARPGX2  GROUP A STREP BY PCR  HEPATIC FUNCTION PANEL  MAGNESIUM  HIV ANTIBODY (ROUTINE TESTING W REFLEX)  BASIC METABOLIC PANEL  TSH  BRAIN NATRIURETIC PEPTIDE  HEMOGLOBIN A1C  TROPONIN I (HIGH SENSITIVITY)  TROPONIN I (HIGH SENSITIVITY)    EKG EKG Interpretation  Date/Time:  Saturday February 06 2021 07:28:19 EDT Ventricular Rate:  110 PR Interval:  170 QRS Duration: 88 QT Interval:  314 QTC Calculation: 424 R Axis:   24 Text Interpretation: Sinus tachycardia Possible Left atrial enlargement ST & T wave abnormality, consider inferior ischemia ST & T wave abnormality, consider anterolateral ischemia Abnormal ECG changes are new when compared to prior Confirmed by Lorre Nick ([id number]) on 02/06/2021 8:59:54 AM   Radiology DG Chest 2 View  Result Date: 02/06/2021 CLINICAL DATA:  Shortness of breath and chest pain. EXAM: CHEST - 2 VIEW COMPARISON:  None. FINDINGS: Heart size and mediastinal contours are within normal limits. Lungs are clear. No pleural effusion or pneumothorax is seen. Osseous structures about the chest are unremarkable. IMPRESSION: No active cardiopulmonary disease. No evidence of pneumonia or pulmonary edema. Electronically Signed   By: Bary Richard M.D.   On: 02/06/2021 08:14   CT Soft Tissue Neck W Contrast  Result Date: 02/06/2021  CLINICAL DATA:   Epiglottitis or tonsillitis suspected. Shortness of breath since last night. Stridor. Question tonsillitis. EXAM: CT NECK WITH CONTRAST TECHNIQUE: Multidetector CT imaging of the neck was performed using the standard protocol following the bolus administration of intravenous contrast. CONTRAST:  50mL OMNIPAQUE IOHEXOL 350 MG/ML SOLN COMPARISON:  No pertinent prior exams available for comparison. FINDINGS: Pharynx and larynx: Nonspecific mucosal/submucosal edema within the nasopharynx. Additionally, there is symmetric prominence of the palatine tonsils. Findings are compatible with pharyngitis and tonsillitis in the appropriate clinical setting. No evidence of peritonsillar abscess. The epiglottis and larynx are unremarkable. No significant airway effacement. No retropharyngeal collection. Salivary glands: No inflammation, mass, or stone. Thyroid: Unremarkable. Lymph nodes: Mild bilateral upper cervical chain lymphadenopathy. Vascular: The major vascular structures of the neck are patent. Atherosclerotic plaque within the right carotid bifurcation. Limited intracranial: No evidence of acute intracranial abnormality within the field of view. Visualized orbits: Excluded from the field of view. Mastoids and visualized paranasal sinuses: Mild bilateral ethmoid and maxillary sinus mucosal thickening at the imaged levels. Small left maxillary sinus mucous retention cyst. No significant mastoid effusion. Skeleton: No acute bony abnormality or aggressive osseous lesion. Cervical spondylosis with multilevel disc bulges, endplate spurring and uncovertebral hypertrophy. Upper chest: No consolidation within the imaged lung apices. IMPRESSION: Mucosal/submucosal edema within the nasopharynx with symmetric prominence of the palatine tonsils, nonspecific but compatible with pharyngitis and tonsillitis in the appropriate clinical setting. No evidence of peritonsillar abscess. No retropharyngeal fluid collection. No significant  airway effacement. Unremarkable appearance of the epiglottis and larynx. Mild bilateral upper cervical chain lymphadenopathy, which may be reactive. Close clinical follow-up is recommended to ensure resolution of the patient's symptoms and to ensure resolution of cervical lymphadenopathy (with imaging follow-up as warranted). Electronically Signed   By: Jackey Loge DO   On: 02/06/2021 12:29   CT Angio Chest PE W/Cm &/Or Wo Cm  Result Date: 02/06/2021 CLINICAL DATA:  Shortness of breath and wheezing. Evaluate for pulmonary embolus. EXAM: CT ANGIOGRAPHY CHEST WITH CONTRAST TECHNIQUE: Multidetector CT imaging of the chest was performed using the standard protocol during bolus administration of intravenous contrast. Multiplanar CT image reconstructions and MIPs were obtained to evaluate the vascular anatomy. CONTRAST:  52mL OMNIPAQUE IOHEXOL 350 MG/ML SOLN COMPARISON:  Chest x-ray February 06, 2021 FINDINGS: Cardiovascular: Satisfactory opacification of the pulmonary arteries to the segmental level. No evidence of pulmonary embolism. The heart size is enlarged. No pericardial effusion. Mediastinum/Nodes: No enlarged mediastinal, hilar, or axillary lymph nodes. Thyroid gland, trachea, and esophagus demonstrate no significant findings. Lungs/Pleura: Mild atelectasis of posterior lung bases are noted. There is no focal pneumonia or pleural effusion. No pulmonary edema is identified. Upper Abdomen: No acute abnormality.  Small hiatal hernia is noted. Musculoskeletal: Minimal degenerative joint changes of the spine are noted. Review of the MIP images confirms the above findings. IMPRESSION: 1. No pulmonary embolus. 2. Mild atelectasis of posterior lung bases are noted. 3. Cardiomegaly. 4. Small hiatal hernia. Electronically Signed   By: Sherian Rein M.D.   On: 02/06/2021 12:40    Procedures Procedures   Medications Ordered in ED Medications  potassium chloride (KLOR-CON) packet 40 mEq (has no administration in  time range)  amLODipine (NORVASC) tablet 5 mg (has no administration in time range)  carvedilol (COREG) tablet 12.5 mg (has no administration in time range)  hydrALAZINE (APRESOLINE) tablet 25 mg (has no administration in time range)  Vitamin D (Ergocalciferol) (DRISDOL) capsule 50,000 Units (has no administration in time range)  enoxaparin (  LOVENOX) injection 40 mg (has no administration in time range)  acetaminophen (TYLENOL) tablet 650 mg (has no administration in time range)    Or  acetaminophen (TYLENOL) suppository 650 mg (has no administration in time range)  famotidine (PEPCID) tablet 20 mg (has no administration in time range)  insulin aspart (novoLOG) injection 0-9 Units (has no administration in time range)  LORazepam (ATIVAN) injection 1 mg (1 mg Intravenous Given 02/06/21 0834)  glucagon (human recombinant) (GLUCAGEN) injection 1 mg (1 mg Intravenous Given 02/06/21 0838)  potassium chloride 10 mEq in 100 mL IVPB (0 mEq Intravenous Stopped 02/06/21 1111)  sodium chloride 0.9 % bolus 500 mL (0 mLs Intravenous Stopped 02/06/21 1111)  LORazepam (ATIVAN) injection 1 mg (1 mg Intravenous Given 02/06/21 1105)  dexamethasone (DECADRON) injection 10 mg (10 mg Intravenous Given 02/06/21 1111)  iohexol (OMNIPAQUE) 350 MG/ML injection 80 mL (80 mLs Intravenous Contrast Given 02/06/21 1207)    ED Course  I have reviewed the triage vital signs and the nursing notes.  Pertinent labs & imaging results that were available during my care of the patient were reviewed by me and considered in my medical decision making (see chart for details).    MDM Rules/Calculators/A&P                         Lori Bennett is a 65 y.o. female with pertinent past medical history of hypertension, and diabetes who presents the emerge department today for shortness of breath.  Patient presenting with symptoms suggestive of food bolus [place], however patient did not eat or drink anything.  Patient keeps pointing to her  chest stating that she thinks something is stuck in her throat, will have episodes where she feels like she is choking and then is normal.  Is able to tolerate water, however states that she feels as if something is stuck.  We will try Ativan and glucagon at this time and reevaluate.  EKG does show some signs concerning for ischemia, Dr. Freida BusmanAllen at bedside.  Potassium 2.7, we are repleting.  Magnesium normal.  Troponin 6, repeat troponin 6.  EKG does have some signs of ischemia.  Patient has multiple episodes, was called in around 1112 for another episode this is the first time patient started having stridor.  Satting at 100%, will obtain CT soft tissue neck and PE study at this time.  We will hold off on racemic epi at this time since there is signs of ischemia on EKG.  CT PE study and CT soft tissue neck without any acute abnormalities.  Patient is continuing to have these episodes, will need to be admitted for this.  Patient admitted to family medicine resident.  The patient appears reasonably stabilized for admission considering the current resources, flow, and capabilities available in the ED at this time, and I doubt any other [facility] further screening and/or treatment in the ED prior to admission.  I discussed this case with my attending physician who cosigned this note including patient's presenting symptoms, physical exam, and planned diagnostics and interventions. Attending physician stated agreement with plan or made changes to plan which were implemented.   Attending physician assessed patient at bedside.  Final Clinical Impression(s) / ED Diagnoses Final diagnoses:  SOB (shortness of breath)    Rx / DC Orders ED Discharge Orders    None       Farrel Gordonatel, Yazleemar Strassner, PA-C 02/06/21 1619    Lorre NickAllen, Anthony, MD 02/09/21 1121

## 2021-02-07 ENCOUNTER — Observation Stay (HOSPITAL_COMMUNITY): Payer: Medicare HMO

## 2021-02-07 ENCOUNTER — Observation Stay (HOSPITAL_BASED_OUTPATIENT_CLINIC_OR_DEPARTMENT_OTHER): Payer: Medicare HMO

## 2021-02-07 DIAGNOSIS — J029 Acute pharyngitis, unspecified: Secondary | ICD-10-CM

## 2021-02-07 DIAGNOSIS — R1310 Dysphagia, unspecified: Secondary | ICD-10-CM | POA: Diagnosis not present

## 2021-02-07 DIAGNOSIS — R0609 Other forms of dyspnea: Secondary | ICD-10-CM | POA: Diagnosis not present

## 2021-02-07 DIAGNOSIS — K219 Gastro-esophageal reflux disease without esophagitis: Secondary | ICD-10-CM

## 2021-02-07 DIAGNOSIS — R0602 Shortness of breath: Secondary | ICD-10-CM

## 2021-02-07 DIAGNOSIS — R0989 Other specified symptoms and signs involving the circulatory and respiratory systems: Secondary | ICD-10-CM

## 2021-02-07 LAB — ECHOCARDIOGRAM COMPLETE
Area-P 1/2: 2.8 cm2
S' Lateral: 2.6 cm

## 2021-02-07 LAB — BASIC METABOLIC PANEL
Anion gap: 8 (ref 5–15)
BUN: 6 mg/dL — ABNORMAL LOW (ref 8–23)
CO2: 23 mmol/L (ref 22–32)
Calcium: 9.5 mg/dL (ref 8.9–10.3)
Chloride: 107 mmol/L (ref 98–111)
Creatinine, Ser: 0.68 mg/dL (ref 0.44–1.00)
GFR, Estimated: >60 mL/min (ref >60)
Glucose, Bld: 165 mg/dL — ABNORMAL HIGH (ref 70–99)
Potassium: 3.7 mmol/L (ref 3.5–5.1)
Sodium: 138 mmol/L (ref 135–145)

## 2021-02-07 LAB — CBC
HCT: 37.9 % (ref 36.0–46.0)
Hemoglobin: 12.5 g/dL (ref 12.0–15.0)
MCH: 29.8 pg (ref 26.0–34.0)
MCHC: 33 g/dL (ref 30.0–36.0)
MCV: 90.2 fL (ref 80.0–100.0)
Platelets: 283 10*3/uL (ref 150–400)
RBC: 4.2 MIL/uL (ref 3.87–5.11)
RDW: 13.2 % (ref 11.5–15.5)
WBC: 18.8 10*3/uL — ABNORMAL HIGH (ref 4.0–10.5)
nRBC: 0 % (ref 0.0–0.2)

## 2021-02-07 LAB — GROUP A STREP BY PCR: Group A Strep by PCR: NOT DETECTED

## 2021-02-07 LAB — GLUCOSE, CAPILLARY
Glucose-Capillary: 159 mg/dL — ABNORMAL HIGH (ref 70–99)
Glucose-Capillary: 160 mg/dL — ABNORMAL HIGH (ref 70–99)
Glucose-Capillary: 153 mg/dL — ABNORMAL HIGH (ref 70–99)

## 2021-02-07 NOTE — Progress Notes (Signed)
Modified Barium Swallow Progress Note  Patient Details  Name: Lori Bennett MRN: [id number] Date of Birth: [birth year]  Today's Date: 02/07/2021  Modified Barium Swallow completed.  Full report located under Chart Review in the Imaging Section.  Brief recommendations include the following:  Clinical Impression  Pt was seen in radiology suite for modified barium swallow study. Trials of puree solids, regular texture solids, a 55mm barium tablet, and thin liquids via cup and straw were administered. Pt's oropharyngeal swallow mechanism was within normal limits without instances of penetration or aspiration even when pt was challenged with large boluses and consecutive swallows. Pt may have up to a regular texture diet with thin liquids at this time. However, considering pt's pharyngitis, a dysphagia 3 diet will be initiated with allowance for further advancement to regular per MD or pt's wishes as pharyngitis is treated. Further skilled SLP services are not clinically indicated at this time.   Swallow Evaluation Recommendations       SLP Diet Recommendations: Regular solids;Thin liquid   Liquid Administration via: Cup;Straw   Medication Administration: Whole meds with liquid   Supervision: Patient able to self feed   Compensations: Slow rate   Postural Changes: Seated upright at 90 degrees   Oral Care Recommendations: Oral care BID;Patient independent with oral care       Yolinda Duerr I. Vear Clock, MS, CCC-SLP Acute Rehabilitation Services Office number [phone] Pager [phone]   Scheryl Marten 02/07/2021,10:41 AM

## 2021-02-07 NOTE — Plan of Care (Signed)

## 2021-02-07 NOTE — Evaluation (Signed)
Clinical/Bedside Swallow Evaluation Patient Details  Name: Lori Bennett MRN: [id number] Date of Birth: 07-Dec-1955  Today's Date: 02/07/2021 Time: SLP Start Time (ACUTE ONLY): 0900 SLP Stop Time (ACUTE ONLY): 0911 SLP Time Calculation (min) (ACUTE ONLY): 11 min  Past Medical History:  Past Medical History:  Diagnosis Date  . Arthritis   . Diabetes mellitus without complication (HCC)    type 2 , does not check cbg at home   . History of blood transfusion 30 years ago   after vaginal delivery   . HTN (hypertension)   . Nerve pain    due to nerve pain brought on by prior right knee surgery   . Noncompliance with medications   . PONV (postoperative nausea and vomiting)    Past Surgical History:  Past Surgical History:  Procedure Laterality Date  . PARTIAL KNEE ARTHROPLASTY Right 05/30/2018   done at [facility] surgery center by dr tim murphy  . partial knee revision  Right 06/14/2018  . ROTATOR CUFF REPAIR Right   . TOTAL KNEE ARTHROPLASTY Left 04/30/2019   Procedure: TOTAL KNEE ARTHROPLASTY;  Surgeon: Sheral Apley, MD;  Location: [facility] ORS;  Service: Orthopedics;  Laterality: Left;  . TUBAL LIGATION     HPI:  Pt is a 65 y.o. female with PMH significant for HTN, T2DM. Pt presented with dyspnea and acute onset dysphagia with gagging and intermittent episodes of choking sensation outside of p.o. intake. CXR 4/16 was negative. CTA Mild atelectasis of posterior lung bases are noted. Small hiatal hernia. CT soft 4/16: Mucosal/submucosal edema within the nasopharynx with symmetric prominence of the palatine tonsils, nonspecific but thought by radiology to be compatible with pharyngitis and tonsillitis in the appropriate clinical setting. No evidence of peritonsillar abscess. A clear liquid diet was initiated.   Assessment / Plan / Recommendation Clinical Impression  Pt was seen for bedside swallow evaluation and she denied a history of dysphagia. Pt reported that her episodes of gagging  and "choking" were ~12 hours after p.o. intake. She stated that she intermittently has globus sensation which inconsistently aligns with p.o. intake. Oral mechanism exam was [facility] for strength and ROM and she presented with full dentures. Delayed coughing was noted with thin liquids, puree, and most significantly with regular texture solids. Pt reported that she has had a baseline cough, so the possibility of this being unrelated to swallowing is considered. A modified barium swallow study has been ordered by the referring physician and will be conducted to further assess oropharyngeal physiology. SLP Visit Diagnosis: Dysphagia, unspecified (R13.10)    Aspiration Risk  Mild aspiration risk    Diet Recommendation Thin liquid (continue thin liquids until MBS completed)   Liquid Administration via: Cup;Straw Medication Administration: Whole meds with liquid Compensations: Slow rate Postural Changes: Seated upright at 90 degrees    Other  Recommendations Oral Care Recommendations: Oral care BID   Follow up Recommendations        Frequency and Duration min 1 x/week  1 week       Prognosis Prognosis for Safe Diet Advancement: Good      Swallow Study   General Date of Onset: 02/06/21 HPI: Pt is a 65 y.o. female with PMH significant for HTN, T2DM. Pt presented with dyspnea and acute onset dysphagia with gagging and intermittent episodes of choking sensation outside of p.o. intake. CXR 4/16 was negative. CTA Mild atelectasis of posterior lung bases are noted. Small hiatal hernia. CT soft 4/16: Mucosal/submucosal edema within the nasopharynx with symmetric prominence of  the palatine tonsils, nonspecific but thought by radiology to be compatible with pharyngitis and tonsillitis in the appropriate clinical setting. No evidence of peritonsillar abscess. A clear liquid diet was initiated. Type of Study: Bedside Swallow Evaluation Previous Swallow Assessment: none stated Diet Prior to this Study: Thin  liquids (clear liquids) Temperature Spikes Noted: No Respiratory Status: Room air History of Recent Intubation: No Behavior/Cognition: Alert;Cooperative;Pleasant mood Oral Cavity Assessment: Within Functional Limits Oral Care Completed by SLP: No Oral Cavity - Dentition: Dentures, bottom;Dentures, top Vision: Functional for self-feeding Self-Feeding Abilities: Needs assist Patient Positioning: Upright in bed;Postural control adequate for testing Baseline Vocal Quality: Normal Volitional Cough: Strong Volitional Swallow: Able to elicit    Oral/Motor/Sensory Function Overall Oral Motor/Sensory Function: Within functional limits   Ice Chips Ice chips: Within functional limits Presentation: Spoon   Thin Liquid Thin Liquid: Impaired Presentation: Straw    Nectar Thick Nectar Thick Liquid: Not tested   Honey Thick Honey Thick Liquid: Not tested   Puree Puree: Impaired Pharyngeal Phase Impairments: Cough - Delayed   Solid     Solid: Impaired Pharyngeal Phase Impairments: Cough - Delayed     Delenn Ahn I. Vear Clock, MS, CCC-SLP Acute Rehabilitation Services Office number [phone] Pager [phone]  Scheryl Marten 02/07/2021,9:28 AM

## 2021-02-07 NOTE — Progress Notes (Signed)
  Echocardiogram 2D Echocardiogram has been performed.  Kanesha Cadle G Usman Millett 02/07/2021, 2:26 PM

## 2021-02-07 NOTE — Consult Note (Signed)
Reason for Consult: Shortness of breath and difficulty swallowing Referring Physician: Doreene Eland, MD  Lori Bennett is an 65 y.o. female.  HPI: 2-day history of difficulty swallowing, choking sensation, intermittent shortness of breath.  She denies heartburn.  She does consume large amounts of caffeine containing soft drinks including Mountain Dew, tea, other sodas.  She eats chocolate and peppermint on a regular basis.  She does not smoke or drink.  Past Medical History:  Diagnosis Date  . Arthritis   . Diabetes mellitus without complication (HCC)    type 2 , does not check cbg at home   . History of blood transfusion 30 years ago   after vaginal delivery   . HTN (hypertension)   . Nerve pain    due to nerve pain brought on by prior right knee surgery   . Noncompliance with medications   . PONV (postoperative nausea and vomiting)     Past Surgical History:  Procedure Laterality Date  . PARTIAL KNEE ARTHROPLASTY Right 05/30/2018   done at [facility] surgery center by dr tim murphy  . partial knee revision  Right 06/14/2018  . ROTATOR CUFF REPAIR Right   . TOTAL KNEE ARTHROPLASTY Left 04/30/2019   Procedure: TOTAL KNEE ARTHROPLASTY;  Surgeon: Sheral Apley, MD;  Location: [facility];  Service: Orthopedics;  Laterality: Left;  . TUBAL LIGATION      Family History  Problem Relation Age of Onset  . Hypertension Mother     Social History:  reports that she has never smoked. She has never used smokeless tobacco. She reports that she does not drink alcohol and does not use drugs.  Allergies:  Allergies  Allergen Reactions  . Ace Inhibitors Cough  . Clonidine Derivatives Other (See Comments)    Severe dry mouth and lethargy.    Medications: Reviewed  Results for orders placed or performed during the hospital encounter of 02/06/21 (from the past 48 hour(s))  Basic metabolic panel     Status: Abnormal   Collection Time: 02/06/21  8:03 AM  Result Value Ref Range    Sodium 142 135 - 145 mmol/L   Potassium 2.7 (LL) 3.5 - 5.1 mmol/L    Comment: CRITICAL RESULT CALLED TO, READ BACK BY AND VERIFIED WITH: M.ENOCH RN @ [phone] 02/06/2021 BY C.EDENS    Chloride 108 98 - 111 mmol/L   CO2 23 22 - 32 mmol/L   Glucose, Bld 161 (H) 70 - 99 mg/dL    Comment: Glucose reference range applies only to samples taken after fasting for at least 8 hours.   BUN <5 (L) 8 - 23 mg/dL   Creatinine, Ser 9.60 0.44 - 1.00 mg/dL   Calcium 9.3 8.9 - 45.4 mg/dL   GFR, Estimated >09 >81 mL/min    Comment: (NOTE) Calculated using the CKD-EPI Creatinine Equation (2021)    Anion gap 11 5 - 15    Comment: Performed at [facility], [address] [place], [place] [address]  CBC     Status: Abnormal   Collection Time: 02/06/21  8:03 AM  Result Value Ref Range   WBC 15.3 (H) 4.0 - 10.5 K/uL   RBC 4.57 3.87 - 5.11 MIL/uL   Hemoglobin 13.2 12.0 - 15.0 g/dL   HCT 82.9 56.2 - 13.0 %   MCV 90.6 80.0 - 100.0 fL   MCH 28.9 26.0 - 34.0 pg   MCHC 31.9 30.0 - 36.0 g/dL   RDW 86.5 78.4 - 69.6 %  Platelets 283 150 - 400 K/uL   nRBC 0.0 0.0 - 0.2 %    Comment: Performed at [facility], [address], [place], [place] [address]  Troponin I (High Sensitivity)     Status: None   Collection Time: 02/06/21  8:03 AM  Result Value Ref Range   Troponin I (High Sensitivity) 6 <18 ng/L    Comment: (NOTE) Elevated high sensitivity troponin I (hsTnI) values and significant  changes across serial measurements may suggest ACS but many other  chronic and acute conditions are known to elevate hsTnI results.  Refer to the Links section for chest pain algorithms and additional  guidance. Performed at [facility], [address], [place], [place] [address]   Hepatic function panel     Status: None   Collection Time: 02/06/21  8:54 AM  Result Value Ref Range   Total Protein 7.3 6.5 - 8.1 g/dL   Albumin 3.6 3.5 - 5.0 g/dL   AST 21 15 - 41 U/L   ALT 23 0 - 44 U/L   Alkaline  Phosphatase 108 38 - 126 U/L   Total Bilirubin 0.6 0.3 - 1.2 mg/dL   Bilirubin, Direct 0.2 0.0 - 0.2 mg/dL   Indirect Bilirubin 0.4 0.3 - 0.9 mg/dL    Comment: Performed at [facility], [address] [place], [place] [address]  Resp Panel by RT-PCR (Flu A&B, Covid) Nasopharyngeal Swab     Status: None   Collection Time: 02/06/21  9:08 AM   Specimen: Nasopharyngeal Swab; Nasopharyngeal(NP) swabs in vial transport medium  Result Value Ref Range   SARS Coronavirus 2 by RT PCR NEGATIVE NEGATIVE    Comment: (NOTE) SARS-CoV-2 target nucleic acids are NOT DETECTED.  The SARS-CoV-2 RNA is generally detectable in upper respiratory specimens during the acute phase of infection. The lowest concentration of SARS-CoV-2 viral copies this assay can detect is 138 copies/mL. A negative result does not preclude SARS-Cov-2 infection and should not be used as the sole basis for treatment or other patient management decisions. A negative result may occur with  improper specimen collection/handling, submission of specimen other than nasopharyngeal swab, presence of viral mutation(s) within the areas targeted by this assay, and inadequate number of viral copies(<138 copies/mL). A negative result must be combined with clinical observations, patient history, and epidemiological information. The expected result is Negative.  Fact Sheet for Patients:  BloggerCourse.com  Fact Sheet for Healthcare Providers:  SeriousBroker.it  This test is no t yet approved or cleared by the [place] FDA and  has been authorized for detection and/or diagnosis of SARS-CoV-2 by FDA under an Emergency Use Authorization (EUA). This EUA will remain  in effect (meaning this test can be used) for the duration of the COVID-19 declaration under Section 564(b)(1) of the Act, 21 U.S.C.section 360bbb-3(b)(1), unless the authorization is terminated  or revoked sooner.        Influenza A by PCR NEGATIVE NEGATIVE   Influenza B by PCR NEGATIVE NEGATIVE    Comment: (NOTE) The Xpert Xpress SARS-CoV-2/FLU/RSV plus assay is intended as an aid in the diagnosis of influenza from Nasopharyngeal swab specimens and should not be used as a sole basis for treatment. Nasal washings and aspirates are unacceptable for Xpert Xpress SARS-CoV-2/FLU/RSV testing.  Fact Sheet for Patients: BloggerCourse.com  Fact Sheet for Healthcare Providers: SeriousBroker.it  This test is not yet approved or cleared by the [place] FDA and has been authorized for detection and/or diagnosis of  SARS-CoV-2 by FDA under an Emergency Use Authorization (EUA). This EUA will remain in effect (meaning this test can be used) for the duration of the COVID-19 declaration under Section 564(b)(1) of the Act, 21 U.S.C. section 360bbb-3(b)(1), unless the authorization is terminated or revoked.  Performed at [facility], [address] [place], [place] [address]   Magnesium     Status: None   Collection Time: 02/06/21 11:27 AM  Result Value Ref Range   Magnesium 1.7 1.7 - 2.4 mg/dL    Comment: Performed at [facility], [address] [place], [place] [address]  Troponin I (High Sensitivity)     Status: None   Collection Time: 02/06/21 11:27 AM  Result Value Ref Range   Troponin I (High Sensitivity) 6 <18 ng/L    Comment: (NOTE) Elevated high sensitivity troponin I (hsTnI) values and significant  changes across serial measurements may suggest ACS but many other  chronic and acute conditions are known to elevate hsTnI results.  Refer to the "Links" section for chest pain algorithms and additional  guidance. Performed at [facility], [address] [place], [place] [address]   Glucose, capillary     Status: Abnormal   Collection Time: 02/06/21  5:06 PM  Result Value Ref Range   Glucose-Capillary 179 (H) 70 - 99 mg/dL     Comment: Glucose reference range applies only to samples taken after fasting for at least 8 hours.  HIV Antibody (routine testing w rflx)     Status: None   Collection Time: 02/06/21  6:20 PM  Result Value Ref Range   HIV Screen 4th Generation wRfx Non Reactive Non Reactive    Comment: Performed at [facility], [address] [place], [place] [address]  Basic metabolic panel     Status: Abnormal   Collection Time: 02/06/21  6:20 PM  Result Value Ref Range   Sodium 140 135 - 145 mmol/L   Potassium 3.6 3.5 - 5.1 mmol/L    Comment: NO VISIBLE HEMOLYSIS   Chloride 108 98 - 111 mmol/L   CO2 21 (L) 22 - 32 mmol/L   Glucose, Bld 209 (H) 70 - 99 mg/dL    Comment: Glucose reference range applies only to samples taken after fasting for at least 8 hours.   BUN 6 (L) 8 - 23 mg/dL   Creatinine, Ser 6.570.77 0.44 - 1.00 mg/dL   Calcium 9.5 8.9 - 84.610.3 mg/dL   GFR, Estimated >96>60 >29>60 mL/min    Comment: (NOTE) Calculated using the CKD-EPI Creatinine Equation (2021)    Anion gap 11 5 - 15    Comment: Performed at [facility], [address] [place], [place] [address]  TSH     Status: None   Collection Time: 02/06/21  6:20 PM  Result Value Ref Range   TSH 0.496 0.350 - 4.500 uIU/mL    Comment: Performed by a 3rd Generation assay with a functional sensitivity of <=0.01 uIU/mL. Performed at [facility], [address] [place], [place] [address]   Brain natriuretic peptide     Status: Abnormal   Collection Time: 02/06/21  6:20 PM  Result Value Ref Range   B Natriuretic Peptide 153.3 (H) 0.0 - 100.0 pg/mL    Comment: Performed at [facility], [address] [place], [place] [address]  Glucose, capillary     Status: Abnormal   Collection Time: 02/06/21  8:36 PM  Result Value  Ref Range   Glucose-Capillary 234 (H) 70 - 99 mg/dL    Comment: Glucose reference range applies only to samples taken after fasting for at least 8 hours.  Group A Strep by PCR     Status: None    Collection Time: 02/06/21 11:41 PM   Specimen: Throat; Sterile Swab  Result Value Ref Range   Group A Strep by PCR NOT DETECTED NOT DETECTED    Comment: Performed at [facility], [address] [place], [place] [address]  CBC     Status: Abnormal   Collection Time: 02/07/21  4:47 AM  Result Value Ref Range   WBC 18.8 (H) 4.0 - 10.5 K/uL   RBC 4.20 3.87 - 5.11 MIL/uL   Hemoglobin 12.5 12.0 - 15.0 g/dL   HCT 60.4 54.0 - 98.1 %   MCV 90.2 80.0 - 100.0 fL   MCH 29.8 26.0 - 34.0 pg   MCHC 33.0 30.0 - 36.0 g/dL   RDW 19.1 47.8 - 29.5 %   Platelets 283 150 - 400 K/uL   nRBC 0.0 0.0 - 0.2 %    Comment: Performed at [facility], [address], [place], [place] [address]  Basic metabolic panel     Status: Abnormal   Collection Time: 02/07/21  4:47 AM  Result Value Ref Range   Sodium 138 135 - 145 mmol/L   Potassium 3.7 3.5 - 5.1 mmol/L   Chloride 107 98 - 111 mmol/L   CO2 23 22 - 32 mmol/L   Glucose, Bld 165 (H) 70 - 99 mg/dL    Comment: Glucose reference range applies only to samples taken after fasting for at least 8 hours.   BUN 6 (L) 8 - 23 mg/dL   Creatinine, Ser 8.65 0.44 - 1.00 mg/dL   Calcium 9.5 8.9 - 78.4 mg/dL   GFR, Estimated >69 >62 mL/min    Comment: (NOTE) Calculated using the CKD-EPI Creatinine Equation (2021)    Anion gap 8 5 - 15    Comment: Performed at [facility], [address], [place], [place] [address]  Glucose, capillary     Status: Abnormal   Collection Time: 02/07/21  8:04 AM  Result Value Ref Range   Glucose-Capillary 159 (H) 70 - 99 mg/dL    Comment: Glucose reference range applies only to samples taken after fasting for at least 8 hours.    DG Chest 2 View  Result Date: 02/06/2021 CLINICAL DATA:  Shortness of breath and chest pain. EXAM: CHEST - 2 VIEW COMPARISON:  None. FINDINGS: Heart size and mediastinal contours are within normal limits. Lungs are clear. No pleural effusion or pneumothorax is seen. Osseous structures about  the chest are unremarkable. IMPRESSION: No active cardiopulmonary disease. No evidence of pneumonia or pulmonary edema. Electronically Signed   By: Bary Richard M.D.   On: 02/06/2021 08:14   CT Soft Tissue Neck W Contrast  Result Date: 02/06/2021 CLINICAL DATA:  Epiglottitis or tonsillitis suspected. Shortness of breath since last night. Stridor. Question tonsillitis. EXAM: CT NECK WITH CONTRAST TECHNIQUE: Multidetector CT imaging of the neck was performed using the standard protocol following the bolus administration of intravenous contrast. CONTRAST:  80mL OMNIPAQUE IOHEXOL 350 MG/ML SOLN COMPARISON:  No pertinent prior exams available for comparison. FINDINGS: Pharynx and larynx: Nonspecific mucosal/submucosal edema within the nasopharynx. Additionally, there is symmetric prominence of the palatine tonsils. Findings are compatible with pharyngitis and tonsillitis in the appropriate clinical setting. No evidence of peritonsillar  abscess. The epiglottis and larynx are unremarkable. No significant airway effacement. No retropharyngeal collection. Salivary glands: No inflammation, mass, or stone. Thyroid: Unremarkable. Lymph nodes: Mild bilateral upper cervical chain lymphadenopathy. Vascular: The major vascular structures of the neck are patent. Atherosclerotic plaque within the right carotid bifurcation. Limited intracranial: No evidence of acute intracranial abnormality within the field of view. Visualized orbits: Excluded from the field of view. Mastoids and visualized paranasal sinuses: Mild bilateral ethmoid and maxillary sinus mucosal thickening at the imaged levels. Small left maxillary sinus mucous retention cyst. No significant mastoid effusion. Skeleton: No acute bony abnormality or aggressive osseous lesion. Cervical spondylosis with multilevel disc bulges, endplate spurring and uncovertebral hypertrophy. Upper chest: No consolidation within the imaged lung apices. IMPRESSION: Mucosal/submucosal  edema within the nasopharynx with symmetric prominence of the palatine tonsils, nonspecific but compatible with pharyngitis and tonsillitis in the appropriate clinical setting. No evidence of peritonsillar abscess. No retropharyngeal fluid collection. No significant airway effacement. Unremarkable appearance of the epiglottis and larynx. Mild bilateral upper cervical chain lymphadenopathy, which may be reactive. Close clinical follow-up is recommended to ensure resolution of the patient's symptoms and to ensure resolution of cervical lymphadenopathy (with imaging follow-up as warranted). Electronically Signed   By: Jackey Loge DO   On: 02/06/2021 12:29   CT Angio Chest PE W/Cm &/Or Wo Cm  Result Date: 02/06/2021 CLINICAL DATA:  Shortness of breath and wheezing. Evaluate for pulmonary embolus. EXAM: CT ANGIOGRAPHY CHEST WITH CONTRAST TECHNIQUE: Multidetector CT imaging of the chest was performed using the standard protocol during bolus administration of intravenous contrast. Multiplanar CT image reconstructions and MIPs were obtained to evaluate the vascular anatomy. CONTRAST:  64mL OMNIPAQUE IOHEXOL 350 MG/ML SOLN COMPARISON:  Chest x-ray February 06, 2021 FINDINGS: Cardiovascular: Satisfactory opacification of the pulmonary arteries to the segmental level. No evidence of pulmonary embolism. The heart size is enlarged. No pericardial effusion. Mediastinum/Nodes: No enlarged mediastinal, hilar, or axillary lymph nodes. Thyroid gland, trachea, and esophagus demonstrate no significant findings. Lungs/Pleura: Mild atelectasis of posterior lung bases are noted. There is no focal pneumonia or pleural effusion. No pulmonary edema is identified. Upper Abdomen: No acute abnormality.  Small hiatal hernia is noted. Musculoskeletal: Minimal degenerative joint changes of the spine are noted. Review of the MIP images confirms the above findings. IMPRESSION: 1. No pulmonary embolus. 2. Mild atelectasis of posterior lung bases  are noted. 3. Cardiomegaly. 4. Small hiatal hernia. Electronically Signed   By: Sherian Rein M.D.   On: 02/06/2021 12:40    VZS:MOLMBEML except as listed in admit H&P  Blood pressure 136/80, pulse 98, temperature 98 F (36.7 C), resp. rate 15, SpO2 98 %.  PHYSICAL EXAM: Overall appearance:  Healthy appearing, in no distress Head:  Normocephalic, atraumatic. Ears: External auditory canals are clear; tympanic membranes are intact in the middle ears are free of any effusion. Nose: External nose is healthy in appearance. Internal nasal exam free of any lesions or obstruction. Oral Cavity/Pharynx:  There are no mucosal lesions or masses identified.  Upper and lower dentures are in place. Larynx/Hypopharynx: Deferred Neuro:  No identifiable neurologic deficits. Neck: No palpable neck masses.  Studies Reviewed: none  Procedures: Flexible endoscopic laryngoscopy.  The nasal cavities were sprayed with topical Afrin/Xylocaine.  The flexible scope was passed down the right nasal cavity.  The nasal cavity was clear.  The nasopharynx contained some thick mucopurulent secretions.  There is inflammation of the nasopharynx but no tumor identified.  Oropharynx looks clear.  Hypopharynx also contain  some thick mucoid secretions.  Larynx was clear.  Arytenoids are swollen but the cords move well and there are no mucosal lesions identified.   Assessment/Plan: Acute pharyngitis (J02.9), chronic reflux disease (K21.9).  Continue medical treatment for the pharyngitis until she is able to swallow effectively.  If she continues having trouble then barium swallow may be useful.  Recommend significant reduction in consumption of caffeine, chocolate and peppermint to help with the reflux.  Can also add medical treatment for reflux.  Follow-up as an outpatient as needed.  Serena Colonel 02/07/2021, 9:37 AM

## 2021-02-07 NOTE — Progress Notes (Signed)
Family Medicine Teaching Service Daily Progress Note Intern Pager: [phone]  Patient name: Lori Bennett Medical record number: [id number] Date of birth: Aug 05, 1956 Age: 65 y.o. Gender: female  Primary Care Provider: Luciano Cutter, PA-C Consultants: ENT Code Status: Full Code   Pt Overview and Major Events to Date:  4/16: admitted for choking sensation, started PCN  4/17: MBS completed, recommended for ENT follow up as outpatient   Assessment and Plan: Lori Bennett is a 65 y.o. female presenting with acute onset dysphagia and dyspnea. PMH is significant for HTN, T2DM   Dysphagia with reports of choking episodes  Patient reports feeling much better than during admission. She has attempted to eat crackers but had some coughing so returned to liquids.  Strep PCR negative. Patient has received 2 doses of PCN.CT of soft tissue showed mucosal/submucosal edema within the nasopharynx with symmetric prominence of the palatine tonsils, nonspecific but compatible with pharyngitis and tonsillitis in the appropriate clinical setting. No evidence of peritonsillar abscess. No retropharyngeal fluid collection. Increased WBC to 18 from 15 possibly 2/2 to dose of steroids on admission. Patient afebrile overnight. Denies SOB or dyspnea today.  -ENT evaluated patient and recommends outpatient f/u as needed  -SLP recs regular diet with thin liquids as patient can tolerate, MBS completed today. Patient with no oropharyngeal erythema, maintaining airway, oxygen saturations >95% on RA.  - VS per floor routine  - continuous pulse oximetry  - likely D/C 4/17 if patient toerates PO foods   Hypokalemia, resolved K this AM is WNL at 3.7. - monitor BMP  - replace potassium as needed    T2DM  Glucose this AM 165. CBG ranged from 170-230s in the last 24 hours. No short acting insulin given.  Home medications include empagliflozin, dulaglutide on Fridays. Hgb A1c 6.2 in 2020 . Holding home medications.  Received 0 units of FA insulin in last 4 hours.  - CBG monitoring   Tachycardia  EKG changes  HT this AM 93. Am EKG pending.  - f/u AM EKG   HTN  BP within normal limits this AM 126/80. Home meds include amlodipine, coreg, hydralazine.  - continue amlodipine, coreg and hydralazine    FEN/GI: regular diet   PPx: lovenox    Status is: Observation  The patient remains OBS appropriate and will d/c before 2 midnights.  Dispo:  Anticipate discharge today after ENT evaluation   Patient From: Home  Planned Disposition: Home  Medically stable for discharge: No       Subjective:  Patient reports she has been experiencing improvement in her ability to swallow liquids.  She reports feeling improved from admission.  Patient denies any current shortness of breath or dyspnea.  She reports that she tried to have a cracker earlier but began to cough and gag so return to drinking solely liquids.  Objective: Temp:  [97.7 F (36.5 C)-99.4 F (37.4 C)] 98 F (36.7 C) (04/17 0837) Pulse Rate:  [93-107] 98 (04/17 0837) Resp:  [15-29] 15 (04/17 0837) BP: (126-150)/(76-93) 136/80 (04/17 0837) SpO2:  [92 %-99 %] 98 % (04/17 0837) FiO2 (%):  [21 %] 21 % (04/16 1509)  Physical Exam: General: Female appears stated age, sitting up in bed in no acute distress Cardiovascular: Regular rate and rhythm, radial pulses palpable bilaterally Respiratory: Normal respiratory effort, breathing comfortably on room air, no wheezing, no crackles Abdomen: Soft, nontender, bowel sounds present  Laboratory: Recent Labs  Lab 02/06/21 0803 02/07/21 0447  WBC 15.3* 18.8*  HGB 13.2 12.5  HCT 41.4 37.9  PLT 283 283   Recent Labs  Lab 02/06/21 0803 02/06/21 0854 02/06/21 1820 02/07/21 0447  NA 142  --  140 138  K 2.7*  --  3.6 3.7  CL 108  --  108 107  CO2 23  --  21* 23  BUN <5*  --  6* 6*  CREATININE 0.66  --  0.77 0.68  CALCIUM 9.3  --  9.5 9.5  PROT  --  7.3  --   --   BILITOT  --  0.6  --    --   ALKPHOS  --  108  --   --   ALT  --  23  --   --   AST  --  21  --   --   GLUCOSE 161*  --  209* 165*     Imaging/Diagnostic Tests: DG Swallowing Func-Speech Pathology  Result Date: 02/07/2021 Objective Swallowing Evaluation: Type of Study: MBS-Modified Barium Swallow Study  Patient Details Name: Lori Bennett MRN: [id number] Date of Birth: 09-03-1956 Today's Date: 02/07/2021 Time: SLP Start Time (ACUTE ONLY): 1008 -SLP Stop Time (ACUTE ONLY): 1020 SLP Time Calculation (min) (ACUTE ONLY): 12 min Past Medical History: Past Medical History: Diagnosis Date . Arthritis  . Diabetes mellitus without complication (HCC)   type 2 , does not check cbg at home  . History of blood transfusion 30 years ago  after vaginal delivery  . HTN (hypertension)  . Nerve pain   due to nerve pain brought on by prior right knee surgery  . Noncompliance with medications  . PONV (postoperative nausea and vomiting)  Past Surgical History: Past Surgical History: Procedure Laterality Date . PARTIAL KNEE ARTHROPLASTY Right 05/30/2018  done at [facility] surgery center by dr tim murphy . partial knee revision  Right 06/14/2018 . ROTATOR CUFF REPAIR Right  . TOTAL KNEE ARTHROPLASTY Left 04/30/2019  Procedure: TOTAL KNEE ARTHROPLASTY;  Surgeon: Sheral Apley, MD;  Location: [facility];  Service: Orthopedics;  Laterality: Left; . TUBAL LIGATION   HPI: Pt is a 65 y.o. female with PMH significant for HTN, T2DM. Pt presented with dyspnea and acute onset dysphagia with gagging and intermittent episodes of choking sensation outside of p.o. intake. CXR 4/16 was negative. CTA Mild atelectasis of posterior lung bases are noted. Small hiatal hernia. CT soft tissue 4/16: Mucosal/submucosal edema within the nasopharynx with symmetric prominence of the palatine tonsils, nonspecific but thought by radiology to be compatible with pharyngitis and tonsillitis in the appropriate clinical setting. No evidence of peritonsillar abscess. Mild bilateral upper  cervical chain lymphadenopathy. Cervical spondylosis with multilevel disc bulges, endplate spurring  and uncovertebral hypertrophy. A clear liquid diet was initiated. ENT 4/17: inflammation of the nasopharynx but no tumor identified. Oropharynx looks clear.  Hypopharynx also contain some thick mucoid secretions.  Larynx was clear.  Arytenoids are swollen but the cords move well and there are no mucosal lesions identified. Pt diagnosed with pharyngitis and chronic reflux disease.  No data recorded Assessment / Plan / Recommendation CHL IP CLINICAL IMPRESSIONS 02/07/2021 Clinical Impression Pt was seen in radiology suite for modified barium swallow study. Trials of puree solids, regular texture solids, a 100mm barium tablet, and thin liquids via cup and straw were administered. Pt's oropharyngeal swallow mechanism was within normal limits without instances of penetration or aspiration even when pt was challenged with large boluses and consecutive swallows. Pt may have up to a regular texture diet with thin liquids at this time. However,  considering pt's pharyngitis, a dysphagia 3 diet will be initiated with allowance for further advancement to regular per MD or pt's wishes as pharyngitis is treated. Further skilled SLP services are not clinically indicated at this time. SLP Visit Diagnosis Dysphagia, unspecified (R13.10) Attention and concentration deficit following -- Frontal lobe and executive function deficit following -- Impact on safety and function No limitations   [facility] IP TREATMENT RECOMMENDATION 02/07/2021 Treatment Recommendations No treatment recommended at this time   Prognosis 02/07/2021 Prognosis for Safe Diet Advancement Good Barriers to Reach Goals -- Barriers/Prognosis Comment -- [facility]L IP DIET RECOMMENDATION 02/07/2021 SLP Diet Recommendations Regular solids;Thin liquid Liquid Administration via Cup;Straw Medication Administration Whole meds with liquid Compensations Slow rate Postural Changes Seated upright  at 90 degrees   CHL IP OTHER RECOMMENDATIONS 02/07/2021 Recommended Consults -- Oral Care Recommendations Oral care BID;Patient independent with oral care Other Recommendations --   [facility] IP FOLLOW UP RECOMMENDATIONS 02/07/2021 Follow up Recommendations None   CHL IP FREQUENCY AND DURATION 02/07/2021 Speech Therapy Frequency (ACUTE ONLY) min 1 x/week Treatment Duration 1 week      CHL IP ORAL PHASE 02/07/2021 Oral Phase WFL Oral - Pudding Teaspoon -- Oral - Pudding Cup -- Oral - Honey Teaspoon -- Oral - Honey Cup -- Oral - Nectar Teaspoon -- Oral - Nectar Cup -- Oral - Nectar Straw -- Oral - Thin Teaspoon -- Oral - Thin Cup -- Oral - Thin Straw -- Oral - Puree -- Oral - Mech Soft -- Oral - Regular -- Oral - Multi-Consistency -- Oral - Pill -- Oral Phase - Comment --  CHL IP PHARYNGEAL PHASE 02/07/2021 Pharyngeal Phase WFL Pharyngeal- Pudding Teaspoon -- Pharyngeal -- Pharyngeal- Pudding Cup -- Pharyngeal -- Pharyngeal- Honey Teaspoon -- Pharyngeal -- Pharyngeal- Honey Cup -- Pharyngeal -- Pharyngeal- Nectar Teaspoon -- Pharyngeal -- Pharyngeal- Nectar Cup -- Pharyngeal -- Pharyngeal- Nectar Straw -- Pharyngeal -- Pharyngeal- Thin Teaspoon -- Pharyngeal -- Pharyngeal- Thin Cup -- Pharyngeal -- Pharyngeal- Thin Straw -- Pharyngeal -- Pharyngeal- Puree -- Pharyngeal -- Pharyngeal- Mechanical Soft -- Pharyngeal -- Pharyngeal- Regular -- Pharyngeal -- Pharyngeal- Multi-consistency -- Pharyngeal -- Pharyngeal- Pill -- Pharyngeal -- Pharyngeal Comment --  CHL IP CERVICAL ESOPHAGEAL PHASE 02/07/2021 Cervical Esophageal Phase WFL Pudding Teaspoon -- Pudding Cup -- Honey Teaspoon -- Honey Cup -- Nectar Teaspoon -- Nectar Cup -- Nectar Straw -- Thin Teaspoon -- Thin Cup -- Thin Straw -- Puree -- Mechanical Soft -- Regular -- Multi-consistency -- Pill -- Cervical Esophageal Comment -- Shanika I. Vear Clock, MS, CCC-SLP Acute Rehabilitation Services Office number [phone] Pager [phone] Scheryl Marten 02/07/2021, 10:47 AM                 Ronnald Ramp, MD 02/07/2021, 2:01 PM PGY-2, [facility] Family Medicine FPTS Intern pager: [phone], text pages welcome

## 2021-02-07 NOTE — Care Management Obs Status (Signed)
MEDICARE OBSERVATION STATUS NOTIFICATION   Patient Details  Name: Lori Bennett MRN: [id number] Date of Birth: 01-02-1956   Medicare Observation Status Notification Given:  Yes    Isaias Cowman, RN 02/07/2021, 4:04 PM

## 2021-02-08 ENCOUNTER — Other Ambulatory Visit (HOSPITAL_COMMUNITY): Payer: Self-pay

## 2021-02-08 ENCOUNTER — Observation Stay (HOSPITAL_COMMUNITY): Payer: Medicare HMO

## 2021-02-08 DIAGNOSIS — K222 Esophageal obstruction: Secondary | ICD-10-CM

## 2021-02-08 DIAGNOSIS — R1310 Dysphagia, unspecified: Secondary | ICD-10-CM | POA: Diagnosis not present

## 2021-02-08 DIAGNOSIS — R0602 Shortness of breath: Secondary | ICD-10-CM | POA: Diagnosis not present

## 2021-02-08 DIAGNOSIS — R059 Cough, unspecified: Secondary | ICD-10-CM | POA: Diagnosis not present

## 2021-02-08 DIAGNOSIS — R0989 Other specified symptoms and signs involving the circulatory and respiratory systems: Secondary | ICD-10-CM | POA: Diagnosis not present

## 2021-02-08 LAB — CBC WITH DIFFERENTIAL/PLATELET
Abs Immature Granulocytes: 0.1 10*3/uL — ABNORMAL HIGH (ref 0.00–0.07)
Basophils Absolute: 0.1 10*3/uL (ref 0.0–0.1)
Basophils Relative: 0 %
Eosinophils Absolute: 0.1 10*3/uL (ref 0.0–0.5)
Eosinophils Relative: 1 %
HCT: 40 % (ref 36.0–46.0)
Hemoglobin: 13.1 g/dL (ref 12.0–15.0)
Immature Granulocytes: 1 %
Lymphocytes Relative: 32 %
Lymphs Abs: 5.2 10*3/uL — ABNORMAL HIGH (ref 0.7–4.0)
MCH: 29.6 pg (ref 26.0–34.0)
MCHC: 32.8 g/dL (ref 30.0–36.0)
MCV: 90.5 fL (ref 80.0–100.0)
Monocytes Absolute: 0.8 10*3/uL (ref 0.1–1.0)
Monocytes Relative: 5 %
Neutro Abs: 9.8 10*3/uL — ABNORMAL HIGH (ref 1.7–7.7)
Neutrophils Relative %: 61 %
Platelets: 320 10*3/uL (ref 150–400)
RBC: 4.42 MIL/uL (ref 3.87–5.11)
RDW: 13.2 % (ref 11.5–15.5)
WBC: 16.1 10*3/uL — ABNORMAL HIGH (ref 4.0–10.5)
nRBC: 0 % (ref 0.0–0.2)

## 2021-02-08 LAB — BASIC METABOLIC PANEL
Anion gap: 8 (ref 5–15)
BUN: 8 mg/dL (ref 8–23)
CO2: 25 mmol/L (ref 22–32)
Calcium: 9 mg/dL (ref 8.9–10.3)
Chloride: 105 mmol/L (ref 98–111)
Creatinine, Ser: 0.73 mg/dL (ref 0.44–1.00)
GFR, Estimated: >60 mL/min (ref >60)
Glucose, Bld: 193 mg/dL — ABNORMAL HIGH (ref 70–99)
Potassium: 3.3 mmol/L — ABNORMAL LOW (ref 3.5–5.1)
Sodium: 138 mmol/L (ref 135–145)

## 2021-02-08 LAB — GLUCOSE, CAPILLARY
Glucose-Capillary: 116 mg/dL — ABNORMAL HIGH (ref 70–99)
Glucose-Capillary: 107 mg/dL — ABNORMAL HIGH (ref 70–99)
Glucose-Capillary: 198 mg/dL — ABNORMAL HIGH (ref 70–99)

## 2021-02-08 LAB — HEMOGLOBIN A1C
Hgb A1c MFr Bld: 6.8 % — ABNORMAL HIGH (ref 4.8–5.6)
Mean Plasma Glucose: 148 mg/dL

## 2021-02-08 MED ORDER — PENICILLIN V POTASSIUM 500 MG PO TABS
500.0000 mg | ORAL_TABLET | Freq: Three times a day (TID) | ORAL | 0 refills | Status: DC
  Filled 2021-02-08: qty 21, 7d supply, fill #0

## 2021-02-08 MED ORDER — POTASSIUM CHLORIDE CRYS ER 20 MEQ PO TBCR
40.0000 meq | EXTENDED_RELEASE_TABLET | Freq: Once | ORAL | Status: AC
  Administered 2021-02-08: 40 meq via ORAL
  Filled 2021-02-08: qty 2

## 2021-02-08 MED ORDER — FAMOTIDINE 20 MG PO TABS
20.0000 mg | ORAL_TABLET | Freq: Two times a day (BID) | ORAL | 0 refills | Status: DC
  Filled 2021-02-08: qty 120, 60d supply, fill #0

## 2021-02-08 MED ORDER — PANTOPRAZOLE SODIUM 40 MG PO TBEC
40.0000 mg | DELAYED_RELEASE_TABLET | Freq: Every day | ORAL | Status: DC
  Administered 2021-02-08 – 2021-02-09 (×2): 40 mg via ORAL
  Filled 2021-02-08 (×2): qty 1

## 2021-02-08 NOTE — H&P (View-Only) (Signed)
[facility] Consult: 2:20 PM 02/08/2021  LOS: 0 days    Referring Provider: Dr Lum Babe Primary Care Physician:  Luciano Cutter, PA-C Primary Gastroenterologist:  [facility].       Reason for Consultation:  Globus sensation.     HPI: Lori Bennett is a 65 y.o. female.  PMH DM 2.   S/p knee, shoulder surgeries. Patient recalls colonoscopy at the [facility] clinic more than 5 years ago but no previous EGD. No PPI etc at home.    Around 4 AM on Saturday morning she was up talking with her sister and developed acute dyspnea it was hard for her to take a breath.  This lasted for a few minutes and then passed.  Additionally describes a globus sensation of something being stuck at the upper esophageal region.  Patient was not eating or drinking anything at the time of the dyspnea episode.  Although that were choking/choked as mentioned in notes, patient denies choking it was just this globus sensation that she had.  Had never had anything like this happen before. Resented to the ED a few hours later, after the episode had resolved Caveat several notes mention that she had mentioned gagging and choking when she was interviewed but currently, today she denies this but she is not the best historian.  CT angio chest showed cardiomegaly, small hiatal hernia.  Mild atelectasis, no PE. CT of soft tissue in the neck showed mucosal and submucosal edema in the nasopharynx, symmetric prominence of palatine tonsils.  Findings nonspecific but compatible with pharyngitis and tonsillitis in the appropriate clinical setting.  Mild bilateral upper cervical chain lymphadenopathy might be reactive.  Patient was evaluated by ENT Dr. Brynda Peon attributed issues to acute pharyngitis and chronic reflux disease.  Recommended barium  swallow if symptoms recurred.  Recommended using consumption of caffeine, chocolate and peppermints which she uses fairly frequently.  Had bedside swallow eval as well as modified barium swallow eval. there was some delayed cough with thin liquids, pures and significant cough with regular textured foods.  The barium study confirmed normal oropharyngeal swallow.  DG esophagus today shows esophageal dysmotility and suspected esophagitis.  Esophageal fold thickening noted but assessment limited on single contrast eval.  Dysmotility with tertiary peristalsis, significant stasis in the proximal esophagus.   Reflux from lower esophagus to the cervical esophagus. Esophageal narrowing with transient arrest of barium tablet at the distal esophagus.  During our meeting the patient denied any further problems with swallowing and actually denied difficulty swallowing in general just described only the globus sensation she had.  Current labs remarkable for slight hypokalemia at 3.3.  WBC 16.1, down from 18.8.  Hb 13.1, MCV 90. Respiratory panel negative.    Past Medical History:  Diagnosis Date  . Arthritis   . Diabetes mellitus without complication (HCC)    type 2 , does not check cbg at home   . History of blood transfusion 30 years ago   after vaginal delivery   . HTN (hypertension)   . Nerve pain    due to  nerve pain brought on by prior right knee surgery   . Noncompliance with medications   . PONV (postoperative nausea and vomiting)     Past Surgical History:  Procedure Laterality Date  . PARTIAL KNEE ARTHROPLASTY Right 05/30/2018   done at [facility] surgery center by dr tim murphy  . partial knee revision  Right 06/14/2018  . ROTATOR CUFF REPAIR Right   . TOTAL KNEE ARTHROPLASTY Left 04/30/2019   Procedure: TOTAL KNEE ARTHROPLASTY;  Surgeon: Sheral Apley, MD;  Location: [facility];  Service: Orthopedics;  Laterality: Left;  . TUBAL LIGATION      Prior to Admission medications    Medication Sig Start Date End Date Taking? Authorizing Provider  amLODipine (NORVASC) 5 MG tablet Take 1 tablet (5 mg total) by mouth 2 (two) times daily. NEED OV. Patient taking differently: Take 5 mg by mouth 2 (two) times daily. 06/18/18  Yes [place], Peter M, MD  carvedilol (COREG) 12.5 MG tablet Take 1 tablet (12.5 mg total) by mouth 2 (two) times daily with a meal. NEED OV. Patient taking differently: Take 12.5 mg by mouth 2 (two) times daily with a meal. 06/18/18  Yes [place], Peter M, MD  dapagliflozin propanediol (FARXIGA) 5 MG TABS tablet Take 5 mg by mouth every morning.   Yes [provider]  Dulaglutide (TRULICITY) 0.75 MG/0.5ML SOPN Inject 0.75 mg into the skin every Friday.   Yes [provider]  hydrALAZINE (APRESOLINE) 25 MG tablet Take 1 tablet (25 mg total) by mouth 2 (two) times daily. NEED OV. Patient taking differently: Take 25 mg by mouth 2 (two) times daily. 06/18/18  Yes [place], Peter M, MD  Vitamin D, Ergocalciferol, (DRISDOL) 1.25 MG (50000 UT) CAPS capsule Take 50,000 Units by mouth every 7 (seven) days. Mondays Patient not taking: No sig reported    [provider]    Scheduled Meds: . amLODipine  5 mg Oral BID  . carvedilol  12.5 mg Oral BID WC  . enoxaparin (LOVENOX) injection  40 mg Subcutaneous Q24H  . famotidine  20 mg Oral BID  . hydrALAZINE  25 mg Oral BID  . insulin aspart  0-9 Units Subcutaneous TID WC  . penicillin v potassium  500 mg Oral Q8H  . Vitamin D (Ergocalciferol)  50,000 Units Oral Q7 days   Infusions:  PRN Meds: acetaminophen **OR** acetaminophen   Allergies as of 02/06/2021 - Review Complete 02/06/2021  Allergen Reaction Noted  . Ace inhibitors Cough 01/27/2011  . Clonidine derivatives Other (See Comments) 02/01/2011    Family History  Problem Relation Age of Onset  . Hypertension Mother     Social History   Socioeconomic History  . Marital status: Single    Spouse name: Not on file  . Number of  children: 2  . Years of education: Not on file  . Highest education level: Not on file  Occupational History  . Occupation: Fish farm manager in a Engineer, maintenance ([employer])  Tobacco Use  . Smoking status: Never Smoker  . Smokeless tobacco: Never Used  Vaping Use  . Vaping Use: Never used  Substance and Sexual Activity  . Alcohol use: No    Alcohol/week: 0.0 standard drinks  . Drug use: No  . Sexual activity: Not on file  Other Topics Concern  . Not on file  Social History Narrative  . Not on file   Social Determinants of Health   Financial Resource Strain: Not on file  Food Insecurity: Not on file  Transportation Needs:  Not on file  Physical Activity: Not on file  Stress: Not on file  Social Connections: Not on file  Intimate Partner Violence: Not on file    REVIEW OF SYSTEMS: Constitutional: No weakness or fatigue. ENT:  No nose bleeds Pulm: Episode of shortness of breath early Saturday morning, resolved and not recurrent. CV:  No palpitations, no LE edema.  No angina GU:  No hematuria, no frequency GI: See HPI. Heme: No unusual or excessive bleeding or bruising Transfusions: Had blood transfusions at the time of childbirth 33 years ago. Neuro:  No headaches, no peripheral tingling or numbness Derm:  No itching, no rash or sores.  Endocrine:  No sweats or chills.  No polyuria or dysuria Immunization: Not queried Travel:  None beyond local counties in last few months.    PHYSICAL EXAM: Vital signs in last 24 hours: Vitals:   02/08/21 0508 02/08/21 0847  BP: 127/78 133/79  Pulse: 78 77  Resp: 18 16  Temp: 98 F (36.7 C)   SpO2: 98% 97%   Wt Readings from Last 3 Encounters:  06/05/20 68 kg  04/30/19 68.5 kg  04/22/19 68.5 kg    General: Pleasant, well-appearing, comfortable. Head: No facial asymmetry or swelling.  No signs of head trauma. Eyes: No scleral icterus, no conjunctival pallor.  EOMI Ears: Not hard of hearing Nose: No congestion or discharge Mouth: Is,  pink, clear oropharynx.  Tongue midline.  Dentures in place. Neck: No JVD, no masses, no tenderness, no thyromegaly Lungs: Clear bilaterally without labored breathing or cough Heart: RRR.  No MRG.  S1, S2 present. Abdomen: Soft.  Not tender.  Not distended.  Negative HSM, masses, bruits, hernias.   Rectal: Deferred Musc/Skeltl: No joint redness, swelling or joint deformity. Extremities: No CCE Neurologic: Oriented x3.  Moves all 4 limbs.  No obvious deficits or tremors Skin: No rashes, no sores, no significant purpura or bruising Nodes: No cervical adenopathy Psych: Pleasant, cooperative.  Fluid speech.  Intake/Output from previous day: No intake/output data recorded. Intake/Output this shift: No intake/output data recorded.  LAB RESULTS: Recent Labs    02/06/21 0803 02/07/21 0447 02/08/21 0839  WBC 15.3* 18.8* 16.1*  HGB 13.2 12.5 13.1  HCT 41.4 37.9 40.0  PLT 283 283 320   BMET Lab Results  Component Value Date   NA 138 02/08/2021   NA 138 02/07/2021   NA 140 02/06/2021   K 3.3 (L) 02/08/2021   K 3.7 02/07/2021   K 3.6 02/06/2021   CL 105 02/08/2021   CL 107 02/07/2021   CL 108 02/06/2021   CO2 25 02/08/2021   CO2 23 02/07/2021   CO2 21 (L) 02/06/2021   GLUCOSE 193 (H) 02/08/2021   GLUCOSE 165 (H) 02/07/2021   GLUCOSE 209 (H) 02/06/2021   BUN 8 02/08/2021   BUN 6 (L) 02/07/2021   BUN 6 (L) 02/06/2021   CREATININE 0.73 02/08/2021   CREATININE 0.68 02/07/2021   CREATININE 0.77 02/06/2021   CALCIUM 9.0 02/08/2021   CALCIUM 9.5 02/07/2021   CALCIUM 9.5 02/06/2021   LFT Recent Labs    02/06/21 0854  PROT 7.3  ALBUMIN 3.6  AST 21  ALT 23  ALKPHOS 108  BILITOT 0.6  BILIDIR 0.2  IBILI 0.4   PT/INR No results found for: INR, PROTIME Hepatitis Panel No results for input(s): HEPBSAG, HCVAB, HEPAIGM, HEPBIGM in the last 72 hours. C-Diff No components found for: CDIFF Lipase  No results found for: LIPASE  Drugs of Abuse  No results found for:  LABOPIA, COCAINSCRNUR, LABBENZ, AMPHETMU, THCU, LABBARB   RADIOLOGY STUDIES: DG Swallowing Func-Speech Pathology  Result Date: 02/07/2021 Objective Swallowing Evaluation: Type of Study: MBS-Modified Barium Swallow Study   CLINICAL IMPRESSIONS 02/07/2021 Clinical Impression Pt was seen in radiology suite for modified barium swallow study. Trials of puree solids, regular texture solids, a 13mm barium tablet, and thin liquids via cup and straw were administered. Pt's oropharyngeal swallow mechanism was within normal limits without instances of penetration or aspiration even when pt was challenged with large boluses and consecutive swallows. Pt may have up to a regular texture diet with thin liquids at this time. However, considering pt's pharyngitis, a dysphagia 3 diet will be initiated with allowance for further advancement to regular per MD or pt's wishes as pharyngitis is treated. Further skilled SLP services are not clinically indicated at this time. SLP Visit Diagnosis Dysphagia, unspecified (R13.10) Attention and concentration deficit following -- Frontal lobe and executive function deficit following -- Impact on safety and function No limitations     Shanika I Phillips 02/07/2021, 10:47 AM              ECHOCARDIOGRAM COMPLETE  Result Date: 02/07/2021 IMPRESSIONS  1. Left ventricular ejection fraction, by estimation, is 60 to 65%. The left ventricle has normal function. The left ventricle has no regional wall motion abnormalities. Left ventricular diastolic parameters are consistent with Grade I diastolic dysfunction (impaired relaxation).  2. Right ventricular systolic function is normal. The right ventricular size is normal.  3. The mitral valve is normal in structure. No evidence of mitral valve regurgitation. No evidence of mitral stenosis.  4. The aortic valve is tricuspid. Aortic valve regurgitation is not visualized. No aortic stenosis is present.  5. The inferior vena cava is normal in size with  greater than 50% respiratory variability, suggesting right atrial pressure of 3 mmHg. Comparison(s): No prior Echocardiogram. Conclusion(s)/Recommendation(s): Otherwise normal echocardiogram, with minor abnormalities described in the report. Electronically signed by Mahesh Chandrasekhar MD Signature Date/Time: 02/07/2021/5:20:43 PM    Final    DG ESOPHAGUS W SINGLE CM (SOL OR THIN BA)  Result Date: 02/08/2021 CLINICAL DATA:  Coughing at night, dysplasia EXAM: ESOPHOGRAM/BARIUM SWALLOW TECHNIQUE: Single contrast examination was performed using  thin barium. FLUOROSCOPY TIME:  Fluoroscopy Time:  3 minutes 54 seconds Radiation Exposure Index (if provided by the fluoroscopic device): 27.6 mGy Number of Acquired Spot Images: 0 COMPARISON:  Chest CT of February 06, 2021 FINDINGS: Spondylosis of the cervical spine incidentally noted. Contrast material passes readily through the esophagus and into the stomach in upright position. Mild aortic impression upon the esophagus. Mild fold thickening is suggested. Primary wave is present though with interruption of the primary wave and proximal escape of the bolus to the cervical esophagus, associated with tertiary peristaltic activity. Mild tapered narrowing of the distal esophagus is suggested on full column assessment with limited distension noted on full column evaluation distally. Significant stasis and tertiary peristaltic activity in the esophagus following full column assessment. Upon swallowing of barium tablet it was halted at the area of the distal esophagus just above the GE junction. Several swallows of thin barium were required to pass the barium tablet. No gross evidence of gastroesophageal reflux though with significant stasis in the esophagus following swallows as described. IMPRESSION: Esophageal dysmotility with suspected signs of esophagitis, limited assessment on single contrast evaluation but with fold thickening suggested on the current study. Dysmotility  includes tertiary peristalsis and significant stasis in the proximal esophagus   with reflux into the cervical esophagus from the lower esophagus. Esophageal narrowing with transient arrest of the barium tablet at the distal esophagus. Electronically Signed   By: Donzetta Kohut M.D.   On: 02/08/2021 13:42     IMPRESSION:   *     Globus sensations with reports of choking.  Reported dysphagia which patient denies Soft tissue neck CT shows mucosal/subcu mucosal edema in the nasopharynx and symmetric prominence of palatine tonsils.  ENT feels this represented acute pharyngitis and chronic reflux disease. Barium esophagram today shows esophageal dysmotility and suspicion for esophagitis, dysmotility, tertiary peristalsis and esophageal stasis in the proximal esophagus.  Reflux from the lower to upper esophagus and narrowing, delayed transit of barium tablet at distal esophagus.    PLAN:     *     ? EGD?Marland Kitchen  The soonest this could get done is tomorrow.  Pt was thinking she would go home today. Dr. Lavon Paganini will see the patient.  If discharged, suggest fup with GI doctor at [facility] clinic.    *   Protonix 40 mg po daily, to replace famotidine.     Jennye Moccasin  02/08/2021, 2:20 PM Phone [phone]   Attending physician's note   I have taken a history, examined the patient and reviewed the chart. I agree with the Advanced Practitioner's note, impression and recommendations.  65 year old female with complaints of globus sensation and dysphagia Esophagram with esophageal dysmotility and possible distal esophageal stricture We will plan to proceed with EGD tomorrow for evaluation and esophageal dilation as needed  The risks and benefits as well as alternatives of endoscopic procedure(s) have been discussed and reviewed. All questions answered. The patient agrees to proceed.  N.p.o. after midnight   The patient was provided an opportunity to ask questions and all were answered. The patient  agreed with the plan and demonstrated an understanding of the instructions.  Iona Beard , MD [phone]

## 2021-02-08 NOTE — Consult Note (Addendum)
[facility] Consult: 2:20 PM 02/08/2021  LOS: 0 days    Referring Provider: Dr Lum Babe Primary Care Physician:  Luciano Cutter, PA-C Primary Gastroenterologist:  [facility].       Reason for Consultation:  Globus sensation.     HPI: Lori Bennett is a 65 y.o. female.  PMH DM 2.   S/p knee, shoulder surgeries. Patient recalls colonoscopy at the [facility] clinic more than 5 years ago but no previous EGD. No PPI etc at home.    Around 4 AM on Saturday morning she was up talking with her sister and developed acute dyspnea it was hard for her to take a breath.  This lasted for a few minutes and then passed.  Additionally describes a globus sensation of something being stuck at the upper esophageal region.  Patient was not eating or drinking anything at the time of the dyspnea episode.  Although that were choking/choked as mentioned in notes, patient denies choking it was just this globus sensation that she had.  Had never had anything like this happen before. Resented to the ED a few hours later, after the episode had resolved Caveat several notes mention that she had mentioned gagging and choking when she was interviewed but currently, today she denies this but she is not the best historian.  CT angio chest showed cardiomegaly, small hiatal hernia.  Mild atelectasis, no PE. CT of soft tissue in the neck showed mucosal and submucosal edema in the nasopharynx, symmetric prominence of palatine tonsils.  Findings nonspecific but compatible with pharyngitis and tonsillitis in the appropriate clinical setting.  Mild bilateral upper cervical chain lymphadenopathy might be reactive.  Patient was evaluated by ENT Dr. Brynda Peon attributed issues to acute pharyngitis and chronic reflux disease.  Recommended barium  swallow if symptoms recurred.  Recommended using consumption of caffeine, chocolate and peppermints which she uses fairly frequently.  Had bedside swallow eval as well as modified barium swallow eval. there was some delayed cough with thin liquids, pures and significant cough with regular textured foods.  The barium study confirmed normal oropharyngeal swallow.  DG esophagus today shows esophageal dysmotility and suspected esophagitis.  Esophageal fold thickening noted but assessment limited on single contrast eval.  Dysmotility with tertiary peristalsis, significant stasis in the proximal esophagus.   Reflux from lower esophagus to the cervical esophagus. Esophageal narrowing with transient arrest of barium tablet at the distal esophagus.  During our meeting the patient denied any further problems with swallowing and actually denied difficulty swallowing in general just described only the globus sensation she had.  Current labs remarkable for slight hypokalemia at 3.3.  WBC 16.1, down from 18.8.  Hb 13.1, MCV 90. Respiratory panel negative.    Past Medical History:  Diagnosis Date  . Arthritis   . Diabetes mellitus without complication (HCC)    type 2 , does not check cbg at home   . History of blood transfusion 30 years ago   after vaginal delivery   . HTN (hypertension)   . Nerve pain    due to  nerve pain brought on by prior right knee surgery   . Noncompliance with medications   . PONV (postoperative nausea and vomiting)     Past Surgical History:  Procedure Laterality Date  . PARTIAL KNEE ARTHROPLASTY Right 05/30/2018   done at [facility] surgery center by dr tim murphy  . partial knee revision  Right 06/14/2018  . ROTATOR CUFF REPAIR Right   . TOTAL KNEE ARTHROPLASTY Left 04/30/2019   Procedure: TOTAL KNEE ARTHROPLASTY;  Surgeon: Sheral Apley, MD;  Location: [facility];  Service: Orthopedics;  Laterality: Left;  . TUBAL LIGATION      Prior to Admission medications    Medication Sig Start Date End Date Taking? Authorizing Provider  amLODipine (NORVASC) 5 MG tablet Take 1 tablet (5 mg total) by mouth 2 (two) times daily. NEED OV. Patient taking differently: Take 5 mg by mouth 2 (two) times daily. 06/18/18  Yes [place], Peter M, MD  carvedilol (COREG) 12.5 MG tablet Take 1 tablet (12.5 mg total) by mouth 2 (two) times daily with a meal. NEED OV. Patient taking differently: Take 12.5 mg by mouth 2 (two) times daily with a meal. 06/18/18  Yes [place], Peter M, MD  dapagliflozin propanediol (FARXIGA) 5 MG TABS tablet Take 5 mg by mouth every morning.   Yes [provider]  Dulaglutide (TRULICITY) 0.75 MG/0.5ML SOPN Inject 0.75 mg into the skin every Friday.   Yes [provider]  hydrALAZINE (APRESOLINE) 25 MG tablet Take 1 tablet (25 mg total) by mouth 2 (two) times daily. NEED OV. Patient taking differently: Take 25 mg by mouth 2 (two) times daily. 06/18/18  Yes [place], Peter M, MD  Vitamin D, Ergocalciferol, (DRISDOL) 1.25 MG (50000 UT) CAPS capsule Take 50,000 Units by mouth every 7 (seven) days. Mondays Patient not taking: No sig reported    [provider]    Scheduled Meds: . amLODipine  5 mg Oral BID  . carvedilol  12.5 mg Oral BID WC  . enoxaparin (LOVENOX) injection  40 mg Subcutaneous Q24H  . famotidine  20 mg Oral BID  . hydrALAZINE  25 mg Oral BID  . insulin aspart  0-9 Units Subcutaneous TID WC  . penicillin v potassium  500 mg Oral Q8H  . Vitamin D (Ergocalciferol)  50,000 Units Oral Q7 days   Infusions:  PRN Meds: acetaminophen **OR** acetaminophen   Allergies as of 02/06/2021 - Review Complete 02/06/2021  Allergen Reaction Noted  . Ace inhibitors Cough 01/27/2011  . Clonidine derivatives Other (See Comments) 02/01/2011    Family History  Problem Relation Age of Onset  . Hypertension Mother     Social History   Socioeconomic History  . Marital status: Single    Spouse name: Not on file  . Number of  children: 2  . Years of education: Not on file  . Highest education level: Not on file  Occupational History  . Occupation: Fish farm manager in a Engineer, maintenance (IT)  Tobacco Use  . Smoking status: Never Smoker  . Smokeless tobacco: Never Used  Vaping Use  . Vaping Use: Never used  Substance and Sexual Activity  . Alcohol use: No    Alcohol/week: 0.0 standard drinks  . Drug use: No  . Sexual activity: Not on file  Other Topics Concern  . Not on file  Social History Narrative  . Not on file   Social Determinants of Health   Financial Resource Strain: Not on file  Food Insecurity: Not on file  Transportation Needs:  Not on file  Physical Activity: Not on file  Stress: Not on file  Social Connections: Not on file  Intimate Partner Violence: Not on file    REVIEW OF SYSTEMS: Constitutional: No weakness or fatigue. ENT:  No nose bleeds Pulm: Episode of shortness of breath early Saturday morning, resolved and not recurrent. CV:  No palpitations, no LE edema.  No angina GU:  No hematuria, no frequency GI: See HPI. Heme: No unusual or excessive bleeding or bruising Transfusions: Had blood transfusions at the time of childbirth 33 years ago. Neuro:  No headaches, no peripheral tingling or numbness Derm:  No itching, no rash or sores.  Endocrine:  No sweats or chills.  No polyuria or dysuria Immunization: Not queried Travel:  None beyond local counties in last few months.    PHYSICAL EXAM: Vital signs in last 24 hours: Vitals:   02/08/21 0508 02/08/21 0847  BP: 127/78 133/79  Pulse: 78 77  Resp: 18 16  Temp: 98 F (36.7 C)   SpO2: 98% 97%   Wt Readings from Last 3 Encounters:  06/05/20 68 kg  04/30/19 68.5 kg  04/22/19 68.5 kg    General: Pleasant, well-appearing, comfortable. Head: No facial asymmetry or swelling.  No signs of head trauma. Eyes: No scleral icterus, no conjunctival pallor.  EOMI Ears: Not hard of hearing Nose: No congestion or discharge Mouth: Is,  pink, clear oropharynx.  Tongue midline.  Dentures in place. Neck: No JVD, no masses, no tenderness, no thyromegaly Lungs: Clear bilaterally without labored breathing or cough Heart: RRR.  No MRG.  S1, S2 present. Abdomen: Soft.  Not tender.  Not distended.  Negative HSM, masses, bruits, hernias.   Rectal: Deferred Musc/Skeltl: No joint redness, swelling or joint deformity. Extremities: No CCE Neurologic: Oriented x3.  Moves all 4 limbs.  No obvious deficits or tremors Skin: No rashes, no sores, no significant purpura or bruising Nodes: No cervical adenopathy Psych: Pleasant, cooperative.  Fluid speech.  Intake/Output from previous day: No intake/output data recorded. Intake/Output this shift: No intake/output data recorded.  LAB RESULTS: Recent Labs    02/06/21 0803 02/07/21 0447 02/08/21 0839  WBC 15.3* 18.8* 16.1*  HGB 13.2 12.5 13.1  HCT 41.4 37.9 40.0  PLT 283 283 320   BMET Lab Results  Component Value Date   NA 138 02/08/2021   NA 138 02/07/2021   NA 140 02/06/2021   K 3.3 (L) 02/08/2021   K 3.7 02/07/2021   K 3.6 02/06/2021   CL 105 02/08/2021   CL 107 02/07/2021   CL 108 02/06/2021   CO2 25 02/08/2021   CO2 23 02/07/2021   CO2 21 (L) 02/06/2021   GLUCOSE 193 (H) 02/08/2021   GLUCOSE 165 (H) 02/07/2021   GLUCOSE 209 (H) 02/06/2021   BUN 8 02/08/2021   BUN 6 (L) 02/07/2021   BUN 6 (L) 02/06/2021   CREATININE 0.73 02/08/2021   CREATININE 0.68 02/07/2021   CREATININE 0.77 02/06/2021   CALCIUM 9.0 02/08/2021   CALCIUM 9.5 02/07/2021   CALCIUM 9.5 02/06/2021   LFT Recent Labs    02/06/21 0854  PROT 7.3  ALBUMIN 3.6  AST 21  ALT 23  ALKPHOS 108  BILITOT 0.6  BILIDIR 0.2  IBILI 0.4   PT/INR No results found for: INR, PROTIME Hepatitis Panel No results for input(s): HEPBSAG, HCVAB, HEPAIGM, HEPBIGM in the last 72 hours. C-Diff No components found for: CDIFF Lipase  No results found for: LIPASE  Drugs of Abuse  No results found for:  LABOPIA, COCAINSCRNUR, LABBENZ, AMPHETMU, THCU, LABBARB   RADIOLOGY STUDIES: DG Swallowing Func-Speech Pathology  Result Date: 02/07/2021 Objective Swallowing Evaluation: Type of Study: MBS-Modified Barium Swallow Study   CLINICAL IMPRESSIONS 02/07/2021 Clinical Impression Pt was seen in radiology suite for modified barium swallow study. Trials of puree solids, regular texture solids, a 13mm barium tablet, and thin liquids via cup and straw were administered. Pt's oropharyngeal swallow mechanism was within normal limits without instances of penetration or aspiration even when pt was challenged with large boluses and consecutive swallows. Pt may have up to a regular texture diet with thin liquids at this time. However, considering pt's pharyngitis, a dysphagia 3 diet will be initiated with allowance for further advancement to regular per MD or pt's wishes as pharyngitis is treated. Further skilled SLP services are not clinically indicated at this time. SLP Visit Diagnosis Dysphagia, unspecified (R13.10) Attention and concentration deficit following -- Frontal lobe and executive function deficit following -- Impact on safety and function No limitations     Scheryl Marten 02/07/2021, 10:47 AM              ECHOCARDIOGRAM COMPLETE  Result Date: 02/07/2021 IMPRESSIONS  1. Left ventricular ejection fraction, by estimation, is 60 to 65%. The left ventricle has normal function. The left ventricle has no regional wall motion abnormalities. Left ventricular diastolic parameters are consistent with Grade I diastolic dysfunction (impaired relaxation).  2. Right ventricular systolic function is normal. The right ventricular size is normal.  3. The mitral valve is normal in structure. No evidence of mitral valve regurgitation. No evidence of mitral stenosis.  4. The aortic valve is tricuspid. Aortic valve regurgitation is not visualized. No aortic stenosis is present.  5. The inferior vena cava is normal in size with  greater than 50% respiratory variability, suggesting right atrial pressure of 3 mmHg. Comparison(s): No prior Echocardiogram. Conclusion(s)/Recommendation(s): Otherwise normal echocardiogram, with minor abnormalities described in the report. Electronically signed by Riley Lam MD Signature Date/Time: 02/07/2021/5:20:43 PM    Final    DG ESOPHAGUS W SINGLE CM (SOL OR THIN BA)  Result Date: 02/08/2021 CLINICAL DATA:  Coughing at night, dysplasia EXAM: ESOPHOGRAM/BARIUM SWALLOW TECHNIQUE: Single contrast examination was performed using  thin barium. FLUOROSCOPY TIME:  Fluoroscopy Time:  3 minutes 54 seconds Radiation Exposure Index (if provided by the fluoroscopic device): 27.6 mGy Number of Acquired Spot Images: 0 COMPARISON:  Chest CT of February 06, 2021 FINDINGS: Spondylosis of the cervical spine incidentally noted. Contrast material passes readily through the esophagus and into the stomach in upright position. Mild aortic impression upon the esophagus. Mild fold thickening is suggested. Primary wave is present though with interruption of the primary wave and proximal escape of the bolus to the cervical esophagus, associated with tertiary peristaltic activity. Mild tapered narrowing of the distal esophagus is suggested on full column assessment with limited distension noted on full column evaluation distally. Significant stasis and tertiary peristaltic activity in the esophagus following full column assessment. Upon swallowing of barium tablet it was halted at the area of the distal esophagus just above the GE junction. Several swallows of thin barium were required to pass the barium tablet. No gross evidence of gastroesophageal reflux though with significant stasis in the esophagus following swallows as described. IMPRESSION: Esophageal dysmotility with suspected signs of esophagitis, limited assessment on single contrast evaluation but with fold thickening suggested on the current study. Dysmotility  includes tertiary peristalsis and significant stasis in the proximal esophagus  with reflux into the cervical esophagus from the lower esophagus. Esophageal narrowing with transient arrest of the barium tablet at the distal esophagus. Electronically Signed   By: Donzetta Kohut M.D.   On: 02/08/2021 13:42     IMPRESSION:   *     Globus sensations with reports of choking.  Reported dysphagia which patient denies Soft tissue neck CT shows mucosal/subcu mucosal edema in the nasopharynx and symmetric prominence of palatine tonsils.  ENT feels this represented acute pharyngitis and chronic reflux disease. Barium esophagram today shows esophageal dysmotility and suspicion for esophagitis, dysmotility, tertiary peristalsis and esophageal stasis in the proximal esophagus.  Reflux from the lower to upper esophagus and narrowing, delayed transit of barium tablet at distal esophagus.    PLAN:     *     ? EGD?Marland Kitchen  The soonest this could get done is tomorrow.  Pt was thinking she would go home today. Dr. Lavon Paganini will see the patient.  If discharged, suggest fup with GI doctor at [facility] clinic.    *   Protonix 40 mg po daily, to replace famotidine.     Jennye Moccasin  02/08/2021, 2:20 PM Phone [phone]   Attending physician's note   I have taken a history, examined the patient and reviewed the chart. I agree with the Advanced Practitioner's note, impression and recommendations.  65 year old female with complaints of globus sensation and dysphagia Esophagram with esophageal dysmotility and possible distal esophageal stricture We will plan to proceed with EGD tomorrow for evaluation and esophageal dilation as needed  The risks and benefits as well as alternatives of endoscopic procedure(s) have been discussed and reviewed. All questions answered. The patient agrees to proceed.  N.p.o. after midnight   The patient was provided an opportunity to ask questions and all were answered. The patient  agreed with the plan and demonstrated an understanding of the instructions.  Iona Beard , MD [phone]

## 2021-02-08 NOTE — Progress Notes (Signed)
Family Medicine Teaching Service Daily Progress Note Intern Pager: [phone]  Patient name: Lori Bennett Medical record number: [id number] Date of birth: 01/22/1956 Age: 65 y.o. Gender: female  Primary Care Provider: Luciano Cutter, PA-C Consultants: ENT (s/o), GI Code Status: Full  Pt Overview and Major Events to Date:  4/16 admitted, started PCN  Assessment and Plan: Lori Bennett a 65 y.o.femalepresenting with acute onset dysphagia and dyspnea. PMH is significant forHTN, T2DM  Dysphagia with reports of choking episodes  Patient reports she is doing much better. Ate breakfast without issue. No choking today. Looking forward to esophageal study. Discussed going home afterwards, patient is amenable and feels safe doing so.  - ENT signed off, no outpatient f/u indicated - GI to give guidance s/p esophageal study - esophageal motility study 1pm - vitals per unit routine  Hypokalemia, resolved K+ this AM 3.3. Kdur 40 mEq ordered once.  - monitor with BMP  T2DM  Glucose last 24 hours 107-193. No short acting insulin given per protocol.  - continue CBG monitoring  HTN  Normotensive last 24 hours. Continue home amlodipine, coreg, and hydralazine.    FEN/GI: Dysphagia 3 PPx: lovenox   Status is: Observation  The patient remains OBS appropriate and will d/c before 2 midnights.  Dispo:  Patient From: Home  Planned Disposition: Home  Medically stable for discharge: No      Subjective:  Patient reports she is doing much better. Ate breakfast without issue. No choking today. Looking forward to esophageal study. Discussed going home afterwards, patient is amenable and feels safe doing so.  Objective: Temp:  [97.6 F (36.4 C)-98 F (36.7 C)] 98 F (36.7 C) (04/18 0508) Pulse Rate:  [77-90] 77 (04/18 0847) Resp:  [16-20] 16 (04/18 0847) BP: (127-133)/(70-79) 133/79 (04/18 0847) SpO2:  [95 %-98 %] 97 % (04/18 0847) Physical Exam: General: awake, alert,  oriented, no distress Respiratory: unlabored respirations, no respiratory distress Extremities: moving all spontaneously, no focal deficits  Laboratory: Recent Labs  Lab 02/06/21 0803 02/07/21 0447 02/08/21 0839  WBC 15.3* 18.8* 16.1*  HGB 13.2 12.5 13.1  HCT 41.4 37.9 40.0  PLT 283 283 320   Recent Labs  Lab 02/06/21 0854 02/06/21 1820 02/07/21 0447 02/08/21 0839  NA  --  140 138 138  K  --  3.6 3.7 3.3*  CL  --  108 107 105  CO2  --  21* 23 25  BUN  --  6* 6* 8  CREATININE  --  0.77 0.68 0.73  CALCIUM  --  9.5 9.5 9.0  PROT 7.3  --   --   --   BILITOT 0.6  --   --   --   ALKPHOS 108  --   --   --   ALT 23  --   --   --   AST 21  --   --   --   GLUCOSE  --  209* 165* 193*    Imaging/Diagnostic Tests: ESOPHOGRAM/BARIUM SWALLOW 02/08/2021 IMPRESSION: Esophageal dysmotility with suspected signs of esophagitis, limited assessment on single contrast evaluation but with fold thickening suggested on the current study.  Dysmotility includes tertiary peristalsis and significant stasis in the proximal esophagus with reflux into the cervical esophagus from the lower esophagus.  Esophageal narrowing with transient arrest of the barium tablet at the distal esophagus.   Lori Pho, MD 02/08/2021, 2:57 PM PGY-1, [facility] Family Medicine FPTS Intern pager: [phone], text pages welcome

## 2021-02-08 NOTE — Plan of Care (Signed)

## 2021-02-08 NOTE — Progress Notes (Signed)
I discussed this patient with GI - Lianne Bushy. She recommends getting DG esophagus to assess for esophageal dysmotility. If abnormal, GI will see. The resident to f/u with GI post-test result.

## 2021-02-08 NOTE — Hospital Course (Addendum)
Lori Bennett is a 65 y.o. female presenting with acute onset dysphagia and dyspnea. PMH is significant for HTN, T2DM.   Dysphagia  SOB Patient presented with acute of episodes of choking.  Patient was able to maintain airway and breathe comfortably on room air.  She did not have oxygen requirement on admission.  Chest XR negative for acute processes. CT soft tissue of neck demonstrated mucosal and submucosal edema within nasopharynx along with mild bilateral upper cervical chain lymphadenopathy. CTA chest PE study negative for PE but did show cardiomegaly and a mild hiatal hernia. Antibiotics initiated for suspected pharyngitis. Esophagram demonstrated esophageal dysmotility with tertiary peristalsis, significant stasis in proximal esophagus, and reflux from lower esophagus into cervical esophagus.  Patient was evaluated by ENT and recommended for outpatient follow-up as well as dietary changes including decreased caffeine and peppermint.    GI consulted, performed EGD. Did not find any endoscopic abnormality to explain complaint of dysphagia. They did, however, proceed with dilation of distal third of esophagus. Procedure went well. Patient able to return to normal diet and resume medications afterwards. GI recommended outpatient follow up if she continues to have persistent symptoms of dysphagia, may need additional work up at that time.

## 2021-02-09 ENCOUNTER — Observation Stay (HOSPITAL_COMMUNITY): Payer: Medicare HMO | Admitting: Certified Registered Nurse Anesthetist

## 2021-02-09 ENCOUNTER — Encounter (HOSPITAL_COMMUNITY)
Admission: EM | Disposition: A | Discharge status: Home / Self Care | Payer: Self-pay | Source: Home / Self Care | Attending: Emergency Medicine

## 2021-02-09 ENCOUNTER — Other Ambulatory Visit (HOSPITAL_COMMUNITY): Payer: Self-pay

## 2021-02-09 ENCOUNTER — Encounter (HOSPITAL_COMMUNITY): Payer: Self-pay | Admitting: Family Medicine

## 2021-02-09 DIAGNOSIS — E119 Type 2 diabetes mellitus without complications: Secondary | ICD-10-CM | POA: Diagnosis not present

## 2021-02-09 DIAGNOSIS — R0600 Dyspnea, unspecified: Secondary | ICD-10-CM | POA: Diagnosis not present

## 2021-02-09 DIAGNOSIS — R0602 Shortness of breath: Secondary | ICD-10-CM | POA: Diagnosis not present

## 2021-02-09 DIAGNOSIS — I10 Essential (primary) hypertension: Secondary | ICD-10-CM | POA: Diagnosis not present

## 2021-02-09 DIAGNOSIS — Z20822 Contact with and (suspected) exposure to covid-19: Secondary | ICD-10-CM | POA: Diagnosis not present

## 2021-02-09 DIAGNOSIS — Z7984 Long term (current) use of oral hypoglycemic drugs: Secondary | ICD-10-CM | POA: Diagnosis not present

## 2021-02-09 DIAGNOSIS — Z79899 Other long term (current) drug therapy: Secondary | ICD-10-CM | POA: Diagnosis not present

## 2021-02-09 DIAGNOSIS — R0989 Other specified symptoms and signs involving the circulatory and respiratory systems: Secondary | ICD-10-CM | POA: Diagnosis not present

## 2021-02-09 DIAGNOSIS — K222 Esophageal obstruction: Secondary | ICD-10-CM | POA: Diagnosis not present

## 2021-02-09 DIAGNOSIS — R1310 Dysphagia, unspecified: Secondary | ICD-10-CM | POA: Diagnosis not present

## 2021-02-09 DIAGNOSIS — Z794 Long term (current) use of insulin: Secondary | ICD-10-CM | POA: Diagnosis not present

## 2021-02-09 HISTORY — PX: BALLOON DILATION: SHX5330

## 2021-02-09 HISTORY — PX: ESOPHAGOGASTRODUODENOSCOPY (EGD) WITH PROPOFOL: SHX5813

## 2021-02-09 LAB — GLUCOSE, CAPILLARY
Glucose-Capillary: 109 mg/dL — ABNORMAL HIGH (ref 70–99)
Glucose-Capillary: 101 mg/dL — ABNORMAL HIGH (ref 70–99)

## 2021-02-09 SURGERY — ESOPHAGOGASTRODUODENOSCOPY (EGD) WITH PROPOFOL
Anesthesia: Monitor Anesthesia Care

## 2021-02-09 MED ORDER — PROPOFOL 10 MG/ML IV BOLUS
INTRAVENOUS | Status: DC | PRN
  Administered 2021-02-09: 30 mg via INTRAVENOUS

## 2021-02-09 MED ORDER — PANTOPRAZOLE SODIUM 40 MG PO TBEC
40.0000 mg | DELAYED_RELEASE_TABLET | Freq: Every day | ORAL | 0 refills | Status: DC
  Filled 2021-02-09: qty 14, 14d supply, fill #0

## 2021-02-09 MED ORDER — GLYCOPYRROLATE 0.2 MG/ML IJ SOLN
INTRAMUSCULAR | Status: DC | PRN
  Administered 2021-02-09 (×2): .1 mg via INTRAVENOUS

## 2021-02-09 MED ORDER — LACTATED RINGERS IV SOLN: INTRAVENOUS | Status: DC | PRN

## 2021-02-09 MED ORDER — PROPOFOL 500 MG/50ML IV EMUL
INTRAVENOUS | Status: DC | PRN
  Administered 2021-02-09: 125 ug/kg/min via INTRAVENOUS

## 2021-02-09 MED ORDER — LIDOCAINE HCL (CARDIAC) PF 100 MG/5ML IV SOSY
PREFILLED_SYRINGE | INTRAVENOUS | Status: DC | PRN
  Administered 2021-02-09: 80 mg via INTRATRACHEAL

## 2021-02-09 SURGICAL SUPPLY — 15 items

## 2021-02-09 NOTE — Discharge Instructions (Signed)
Dear Lori Bennett,   Thank you so much for allowing [place] to be part of your care!  You were admitted to [facility] for difficulty swallowing, choking and shortness of breath.  You were evaluated by ear nose and throat physician who did not recommend any further therapy.  You may follow-up with ear nose and throat as needed on an outpatient basis. You were treated for pharyngitis based on the results of imaging with penicillin. You also underwent a modified barium swallow test to evaluate your esophagus motility and this was determined to be normal. You were able to have a regular diet.   POST-HOSPITAL & CARE INSTRUCTIONS 1. Please arrange for follow-up with your primary care doctor within the next week. 2. Please let PCP/Specialists know of any changes that were made.  3. Please see medications section of this packet for any medication changes.  RETURN PRECAUTIONS: We recommend that you return to care if you begin to experience difficulty breathing or swallowing.  Take care and be well!  Family Medicine Teaching Service  [facility]  [address] [place], [place] [address] [phone]

## 2021-02-09 NOTE — Transfer of Care (Signed)
Immediate Anesthesia Transfer of Care Note  Patient: Mikala Podoll  Procedure(s) Performed: ESOPHAGOGASTRODUODENOSCOPY (EGD) WITH PROPOFOL (N/A ) BALLOON DILATION (N/A )  Patient Location: Endoscopy Unit  Anesthesia Type:MAC  Level of Consciousness: awake, alert  and oriented  Airway & Oxygen Therapy: Patient Spontanous Breathing and Patient connected to nasal cannula oxygen  Post-op Assessment: Report given to RN, Post -op Vital signs reviewed and stable and Patient moving all extremities X 4  Post vital signs: Reviewed and stable  Last Vitals:  Vitals Value Taken Time  BP 99/52 02/09/21 1145  Temp 36.7 C 02/09/21 1145  Pulse 75 02/09/21 1148  Resp 25 02/09/21 1148  SpO2 97 % 02/09/21 1148  Vitals shown include unvalidated device data.  Last Pain:  Vitals:   02/09/21 1145  TempSrc: Axillary  PainSc: 0-No pain      Patients Stated Pain Goal: 0 (54/49/20 1007)  Complications: No complications documented.

## 2021-02-09 NOTE — Progress Notes (Signed)
The patient denies any new concerns today. She still coughs on and off with eating--no abnormal findings on exam.  As discussed with her, she will likely go home today post EGD if all is normal. She will need a close follow-up with her PCP.

## 2021-02-09 NOTE — Op Note (Signed)
[facility] Patient Name: Lori Bennett Procedure Date : 02/09/2021 MRN: [id number] Attending MD: Napoleon Form , MD Date of Birth: May 10, 1956 CSN: [id number] Age: 65 Admit Type: Inpatient Procedure:                Upper GI endoscopy Indications:              Dysphagia Providers:                Napoleon Form, MD, Weston Settle, RN,                            Rozetta Nunnery, Technician Referring MD:              Medicines:                Monitored Anesthesia Care Complications:            No immediate complications. Estimated Blood Loss:     Estimated blood loss was minimal. Procedure:                Pre-Anesthesia Assessment:                           - Prior to the procedure, a History and Physical                            was performed, and patient medications and                            allergies were reviewed. The patient's tolerance of                            previous anesthesia was also reviewed. The risks                            and benefits of the procedure and the sedation                            options and risks were discussed with the patient.                            All questions were answered, and informed consent                            was obtained. Prior Anticoagulants: The patient has                            taken no previous anticoagulant or antiplatelet                            agents. ASA Grade Assessment: II - A patient with                            mild systemic disease. After reviewing the risks  and benefits, the patient was deemed in                            satisfactory condition to undergo the procedure.                           After obtaining informed consent, the endoscope was                            passed under direct vision. Throughout the                            procedure, the patient's blood pressure, pulse, and                            oxygen saturations  were monitored continuously. The                            GIF-H190 ([id number] Olympus gastroscope was                            introduced through the mouth, and advanced to the                            second part of duodenum. The upper GI endoscopy was                            accomplished without difficulty. The patient                            tolerated the procedure well. Scope In: Scope Out: Findings:      No endoscopic abnormality was evident in the esophagus to explain the       patient's complaint of dysphagia. It was decided, however, to proceed       with dilation of the lower third of the esophagus. A TTS dilator was       passed through the scope. Dilation with an 18-19-20 mm balloon dilator       was performed to 20 mm. The dilation site was examined following       endoscope reinsertion and showed no change.      The Z-line was regular and was found 38 cm from the incisors.      The gastroesophageal flap valve was visualized endoscopically and       classified as Hill Grade II (fold present, opens with respiration).      The stomach was normal.      The cardia and gastric fundus were normal on retroflexion.      The examined duodenum was normal. Impression:               - No endoscopic esophageal abnormality to explain                            patient's dysphagia. Esophagus dilated. Dilated.                           -  Z-line regular, 38 cm from the incisors.                           - Gastroesophageal flap valve classified as Hill                            Grade II (fold present, opens with respiration).                           - Normal stomach.                           - Normal examined duodenum.                           - No specimens collected. Recommendation:           - Patient has a contact number available for                            emergencies. The signs and symptoms of potential                            delayed complications were  discussed with the                            patient. Return to normal activities tomorrow.                            Written discharge instructions were provided to the                            patient.                           - Resume previous diet.                           - Continue present medications.                           - OK to discharge home from GI standpoint                           - Follow up with outpatient GI if continues to have                            persitent symptoms of dysphagia for additional work                            up Procedure Code(s):        --- Professional ---                           [phone], Esophagogastroduodenoscopy, flexible,  transoral; with transendoscopic balloon dilation of                            esophagus (less than 30 mm diameter) Diagnosis Code(s):        --- Professional ---                           R13.10, Dysphagia, unspecified CPT copyright 2019 [employer]. All rights reserved. The codes documented in this report are preliminary and upon coder review may  be revised to meet current compliance requirements. Napoleon Form, MD 02/09/2021 11:52:15 AM This report has been signed electronically. Number of Addenda: 0

## 2021-02-09 NOTE — Anesthesia Postprocedure Evaluation (Signed)
Anesthesia Post Note  Patient: Lori Bennett  Procedure(s) Performed: ESOPHAGOGASTRODUODENOSCOPY (EGD) WITH PROPOFOL (N/A ) BALLOON DILATION (N/A )     Patient location during evaluation: Endoscopy Anesthesia Type: MAC Level of consciousness: awake and alert Pain management: pain level controlled Vital Signs Assessment: post-procedure vital signs reviewed and stable Respiratory status: spontaneous breathing, nonlabored ventilation, respiratory function stable and patient connected to nasal cannula oxygen Cardiovascular status: stable and blood pressure returned to baseline Postop Assessment: no apparent nausea or vomiting Anesthetic complications: no   No complications documented.  Last Vitals:  Vitals:   02/09/21 1214 02/09/21 1357  BP: (!) 141/94 133/89  Pulse: 74 74  Resp: (!) 21 18  Temp:    SpO2: 95% 100%    Last Pain:  Vitals:   02/09/21 1357  TempSrc:   PainSc: 0-No pain                 Catalina Gravel

## 2021-02-09 NOTE — Progress Notes (Signed)
Patient discharged in stable condition via car with family. AVS documentation given and explained. All questions and concerns were addressed. No additional questions or concerns at this time.

## 2021-02-09 NOTE — Anesthesia Procedure Notes (Signed)
Procedure Name: MAC Date/Time: 02/09/2021 11:24 AM Performed by: Kathryne Hitch, CRNA Pre-anesthesia Checklist: Patient identified, Emergency Drugs available, Patient being monitored and Suction available Patient Re-evaluated:Patient Re-evaluated prior to induction Oxygen Delivery Method: Nasal cannula Preoxygenation: Pre-oxygenation with 100% oxygen Induction Type: IV induction Placement Confirmation: positive ETCO2 Dental Injury: Teeth and Oropharynx as per pre-operative assessment

## 2021-02-09 NOTE — Plan of Care (Signed)
  Problem: Education: Goal: Knowledge of General Education information will improve Description: Including pain rating scale, medication(s)/side effects and non-pharmacologic comfort measures 02/09/2021 1521 by Ross Ludwig, LPN Outcome: Adequate for Discharge 02/09/2021 1521 by Ross Ludwig, LPN Outcome: Progressing   Problem: Health Behavior/Discharge Planning: Goal: Ability to manage health-related needs will improve 02/09/2021 1521 by Ross Ludwig, LPN Outcome: Adequate for Discharge 02/09/2021 1521 by Ross Ludwig, LPN Outcome: Progressing   Problem: Clinical Measurements: Goal: Ability to maintain clinical measurements within normal limits will improve 02/09/2021 1521 by Ross Ludwig, LPN Outcome: Adequate for Discharge 02/09/2021 1521 by Ross Ludwig, LPN Outcome: Progressing Goal: Will remain free from infection 02/09/2021 1521 by Ross Ludwig, LPN Outcome: Adequate for Discharge 02/09/2021 1521 by Ross Ludwig, LPN Outcome: Progressing Goal: Diagnostic test results will improve 02/09/2021 1521 by Ross Ludwig, LPN Outcome: Adequate for Discharge 02/09/2021 1521 by Ross Ludwig, LPN Outcome: Progressing Goal: Respiratory complications will improve 02/09/2021 1521 by Ross Ludwig, LPN Outcome: Adequate for Discharge 02/09/2021 1521 by Ross Ludwig, LPN Outcome: Progressing Goal: Cardiovascular complication will be avoided 02/09/2021 1521 by Ross Ludwig, LPN Outcome: Adequate for Discharge 02/09/2021 1521 by Ross Ludwig, LPN Outcome: Progressing   Problem: Activity: Goal: Risk for activity intolerance will decrease 02/09/2021 1521 by Ross Ludwig, LPN Outcome: Adequate for Discharge 02/09/2021 1521 by Ross Ludwig, LPN Outcome: Progressing   Problem: Nutrition: Goal: Adequate nutrition will be maintained 02/09/2021 1521 by Ross Ludwig, LPN Outcome: Adequate for Discharge 02/09/2021 1521 by Ross Ludwig, LPN Outcome: Progressing   Problem: Coping: Goal: Level of anxiety will decrease 02/09/2021 1521 by Ross Ludwig, LPN Outcome: Adequate for Discharge 02/09/2021 1521 by Ross Ludwig, LPN Outcome: Progressing   Problem: Elimination: Goal: Will not experience complications related to bowel motility 02/09/2021 1521 by Ross Ludwig, LPN Outcome: Adequate for Discharge 02/09/2021 1521 by Ross Ludwig, LPN Outcome: Progressing Goal: Will not experience complications related to urinary retention 02/09/2021 1521 by Ross Ludwig, LPN Outcome: Adequate for Discharge 02/09/2021 1521 by Ross Ludwig, LPN Outcome: Progressing   Problem: Pain Managment: Goal: General experience of comfort will improve 02/09/2021 1521 by Ross Ludwig, LPN Outcome: Adequate for Discharge 02/09/2021 1521 by Ross Ludwig, LPN Outcome: Progressing   Problem: Safety: Goal: Ability to remain free from injury will improve 02/09/2021 1521 by Ross Ludwig, LPN Outcome: Adequate for Discharge 02/09/2021 1521 by Ross Ludwig, LPN Outcome: Progressing   Problem: Skin Integrity: Goal: Risk for impaired skin integrity will decrease 02/09/2021 1521 by Ross Ludwig, LPN Outcome: Adequate for Discharge 02/09/2021 1521 by Ross Ludwig, LPN Outcome: Progressing

## 2021-02-09 NOTE — Interval H&P Note (Signed)
History and Physical Interval Note:  02/09/2021 11:13 AM  Lori Bennett  has presented today for surgery, with the diagnosis of Globus sensation, choking, dysphagia..  The various methods of treatment have been discussed with the patient and family. After consideration of risks, benefits and other options for treatment, the patient has consented to  Procedure(s): ESOPHAGOGASTRODUODENOSCOPY (EGD) WITH PROPOFOL (N/A) BALLOON DILATION (N/A) as a surgical intervention.  The patient's history has been reviewed, patient examined, no change in status, stable for surgery.  I have reviewed the patient's chart and labs.  Questions were answered to the patient's satisfaction.     Zina Pitzer

## 2021-02-09 NOTE — Anesthesia Preprocedure Evaluation (Addendum)
Anesthesia Evaluation  Patient identified by MRN, date of birth, ID band Patient awake    Reviewed: Allergy & Precautions, NPO status , Patient's Chart, lab work & pertinent test results, reviewed documented beta blocker date and time   History of Anesthesia Complications (+) PONV and history of anesthetic complications  Airway Mallampati: II  TM Distance: >3 FB Neck ROM: Full    Dental  (+) Dental Advisory Given, Upper Dentures, Lower Dentures   Pulmonary neg pulmonary ROS,    Pulmonary exam normal breath sounds clear to auscultation       Cardiovascular hypertension, Pt. on home beta blockers and Pt. on medications Normal cardiovascular exam Rhythm:Regular Rate:Normal     Neuro/Psych negative neurological ROS  negative psych ROS   GI/Hepatic Neg liver ROS, GERD  Medicated,Globus sensation, choking, dysphagia   Endo/Other  diabetes, Type 2, Oral Hypoglycemic Agents  Renal/GU negative Renal ROS     Musculoskeletal  (+) Arthritis ,   Abdominal   Peds  Hematology negative hematology ROS (+)   Anesthesia Other Findings Day of surgery medications reviewed with the patient.  Reproductive/Obstetrics                           Anesthesia Physical Anesthesia Plan  ASA: II  Anesthesia Plan: MAC   Post-op Pain Management:    Induction: Intravenous  PONV Risk Score and Plan: 3 and Propofol infusion and Treatment may vary due to age or medical condition  Airway Management Planned: Nasal Cannula and Natural Airway  Additional Equipment:   Intra-op Plan:   Post-operative Plan:   Informed Consent: I have reviewed the patients History and Physical, chart, labs and discussed the procedure including the risks, benefits and alternatives for the proposed anesthesia with the patient or authorized representative who has indicated his/her understanding and acceptance.     Dental advisory  given  Plan Discussed with: CRNA and Anesthesiologist  Anesthesia Plan Comments:         Anesthesia Quick Evaluation

## 2021-02-09 NOTE — Plan of Care (Signed)

## 2021-02-10 ENCOUNTER — Encounter (HOSPITAL_COMMUNITY): Payer: Self-pay | Admitting: Gastroenterology

## 2021-02-10 NOTE — Discharge Summary (Signed)
Family Medicine Teaching [facility] Discharge Summary  Patient name: Lori Bennett Medical record number: [id number] Date of birth: [birth year] Age: 65 y.o. Gender: female Date of Admission: 02/06/2021  Date of Discharge: 02/09/2021 Admitting Physician: Littie Deeds, MD  Primary Care Provider: Luciano Cutter, PA-C Consultants: ENT, GI  Indication for Hospitalization: Choking sensation, swallowing difficulty  Discharge Diagnoses/Problem List:  Dysphagia Esophageal dysmotility HTN T2DM  Disposition: Home  Discharge Condition: Stable  Discharge Exam:  Blood pressure 133/89, pulse 74, temperature 98.1 F (36.7 C), temperature source Axillary, resp. rate 18, height 5\' 3"  (1.6 m), weight 72 kg, SpO2 100 %.  General: Awake, alert, oriented Cardiovascular: Regular rate and rhythm, S1 and S2 present, no murmurs auscultated Respiratory: Lung fields clear to auscultation bilaterally   Brief Hospital Course:  Lori Bennett is a 65 y.o. female presenting with acute onset dysphagia and dyspnea. PMH is significant for HTN, T2DM.   Dysphagia  SOB Patient presented with acute of episodes of choking.  Patient was able to maintain airway and breathe comfortably on room air.  She did not have oxygen requirement on admission.  Chest XR negative for acute processes. CT soft tissue of neck demonstrated mucosal and submucosal edema within nasopharynx along with mild bilateral upper cervical chain lymphadenopathy. CTA chest PE study negative for PE but did show cardiomegaly and a mild hiatal hernia. Antibiotics initiated for suspected pharyngitis. Esophagram demonstrated esophageal dysmotility with tertiary peristalsis, significant stasis in proximal esophagus, and reflux from lower esophagus into cervical esophagus.  Patient was evaluated by ENT and recommended for outpatient follow-up as well as dietary changes including decreased caffeine and peppermint.    GI consulted, performed EGD. Did  not find any endoscopic abnormality to explain complaint of dysphagia. They did, however, proceed with dilation of distal third of esophagus. Procedure went well. Patient able to return to normal diet and resume medications afterwards. GI recommended outpatient follow up if she continues to have persistent symptoms of dysphagia, may need additional work up at that time.    Issues for Follow Up:  1. Follow up PO tolerance, choking sensation s/p EGD with dilation.  2. ENT outpatient follow up recommended.  3. GI outpatient follow up only recommended if she has persistent symptoms, may need further work up in that case.  4. ENT recommended dietary changes including decreasing caffeine and peppermint.   Significant Procedures: EGD 02/09/2021 with esophageal dilation of distal third of esophagus  Significant Labs and Imaging:  Recent Labs  Lab 02/06/21 0803 02/07/21 0447 02/08/21 0839  WBC 15.3* 18.8* 16.1*  HGB 13.2 12.5 13.1  HCT 41.4 37.9 40.0  PLT 283 283 320   Recent Labs  Lab 02/06/21 0803 02/06/21 0854 02/06/21 1127 02/06/21 1820 02/07/21 0447 02/08/21 0839  NA 142  --   --  140 138 138  K 2.7*  --   --  3.6 3.7 3.3*  CL 108  --   --  108 107 105  CO2 23  --   --  21* 23 25  GLUCOSE 161*  --   --  209* 165* 193*  BUN <5*  --   --  6* 6* 8  CREATININE 0.66  --   --  0.77 0.68 0.73  CALCIUM 9.3  --   --  9.5 9.5 9.0  MG  --   --  1.7  --   --   --   ALKPHOS  --  108  --   --   --   --  AST  --  21  --   --   --   --   ALT  --  23  --   --   --   --   ALBUMIN  --  3.6  --   --   --   --     CHEST - 2 VIEW 02/06/2021 COMPARISON:  None. FINDINGS: Heart size and mediastinal contours are within normal limits. Lungs are clear. No pleural effusion or pneumothorax is seen. Osseous structures about the chest are unremarkable. IMPRESSION: No active cardiopulmonary disease. No evidence of pneumonia or pulmonary edema.  CT ANGIOGRAPHY CHEST WITH CONTRAST  02/06/2021 IMPRESSION: 1. No pulmonary embolus. 2. Mild atelectasis of posterior lung bases are noted. 3. Cardiomegaly. 4. Small hiatal hernia.  CT NECK WITH CONTRAST 02/06/2021 IMPRESSION: Mucosal/submucosal edema within the nasopharynx with symmetric prominence of the palatine tonsils, nonspecific but compatible with pharyngitis and tonsillitis in the appropriate clinical setting. No evidence of peritonsillar abscess. No retropharyngeal fluid collection. No significant airway effacement. Unremarkable appearance of the epiglottis and larynx.  Mild bilateral upper cervical chain lymphadenopathy, which may be reactive.  Close clinical follow-up is recommended to ensure resolution of the patient's symptoms and to ensure resolution of cervical lymphadenopathy (with imaging follow-up as warranted).  MBS-Modified Barium Swallow Study 02/07/2021 Pt was seen in radiology suite for modified barium swallow study. Trials of puree solids, regular texture solids, a 78mm barium tablet, and thin liquids via cup and straw were administered. Pt's oropharyngeal swallow mechanism was within normal limits without instances of penetration or aspiration even when pt was challenged with large boluses and consecutive swallows. Pt may have up to a regular texture diet with thin liquids at this time. However, considering pt's pharyngitis, a dysphagia 3 diet will be initiated with allowance for further advancement to regular per MD or pt's wishes as pharyngitis is treated. Further skilled SLP services are not clinically indicated at this time  ESOPHOGRAM/BARIUM SWALLOW 02/08/2021 IMPRESSION: Esophageal dysmotility with suspected signs of esophagitis, limited assessment on single contrast evaluation but with fold thickening suggested on the current study.  Dysmotility includes tertiary peristalsis and significant stasis in the proximal esophagus with reflux into the cervical esophagus from the lower  esophagus.  Esophageal narrowing with transient arrest of the barium tablet at the distal esophagus.  Upper GI endoscopy 02/09/2021 No endoscopic abnormality was evident in the esophagus to explain the patient's complaint of dysphagia. It was decided, however, to proceed with dilation of the lower third of the esophagus. A TTS dilator was passed through the scope. Dilation with an 18-19-20 mm balloon dilator was performed to 20 mm. The dilation site was examined following endoscope reinsertion and showed no change. Recommendations: - Patient has a contact number available for emergencies. The signs and symptoms of potential delayed complications were discussed with the patient. Return to normal activities tomorrow. Written discharge instructions were provided to the patient. - Resume previous diet. - Continue present medications. - OK to discharge home from GI standpoint - Follow up with outpatient GI if continues to have persitent symptoms of dysphagia for additional work up  Results/Tests Pending at Time of Discharge: None  Discharge Medications:  Allergies as of 02/09/2021      Reactions   Ace Inhibitors Cough   Clonidine Derivatives Other (See Comments)   Severe dry mouth and lethargy.      Medication List    TAKE these medications   amLODipine 5 MG tablet Commonly known as: NORVASC Take 1  tablet (5 mg total) by mouth 2 (two) times daily. NEED OV. What changed: additional instructions   carvedilol 12.5 MG tablet Commonly known as: COREG Take 1 tablet (12.5 mg total) by mouth 2 (two) times daily with a meal. NEED OV. What changed: additional instructions   famotidine 20 MG tablet Commonly known as: PEPCID Take 1 tablet (20 mg total) by mouth 2 (two) times daily.   Farxiga 5 MG Tabs tablet Generic drug: dapagliflozin propanediol Take 5 mg by mouth every morning.   hydrALAZINE 25 MG tablet Commonly known as: APRESOLINE Take 1 tablet (25 mg total) by mouth 2 (two) times  daily. NEED OV. What changed: additional instructions   pantoprazole 40 MG tablet Commonly known as: PROTONIX Take 1 tablet (40 mg total) by mouth daily at 6 (six) AM. Take for two weeks only.   Trulicity 0.75 MG/0.5ML Sopn Generic drug: Dulaglutide Inject 0.75 mg into the skin every Friday.   Vitamin D (Ergocalciferol) 1.25 MG (50000 UNIT) Caps capsule Commonly known as: DRISDOL Take 50,000 Units by mouth every 7 (seven) days. Mondays       Discharge Instructions: Please refer to Patient Instructions section of EMR for full details.  Patient was counseled important signs and symptoms that should prompt return to medical care, changes in medications, dietary instructions, activity restrictions, and follow up appointments.   Follow-Up Appointments:  Follow-up Information    Luciano Cutter, PA-C. Schedule an appointment as soon as possible for a visit in 1 week(s).   Specialty: Physician Assistant Why: Make a hospital follow up appointment with your primary care provider for about one week after follow up.  Contact information: [address] [place]  [place] [address] [phone]               Fayette Pho, MD 02/10/2021, 4:25 PM PGY-1, [facility]

## 2021-02-16 DIAGNOSIS — E119 Type 2 diabetes mellitus without complications: Secondary | ICD-10-CM | POA: Diagnosis not present

## 2021-02-16 DIAGNOSIS — I10 Essential (primary) hypertension: Secondary | ICD-10-CM | POA: Diagnosis not present

## 2021-02-16 DIAGNOSIS — R1310 Dysphagia, unspecified: Secondary | ICD-10-CM | POA: Diagnosis not present

## 2021-02-16 DIAGNOSIS — E559 Vitamin D deficiency, unspecified: Secondary | ICD-10-CM | POA: Diagnosis not present

## 2021-02-16 DIAGNOSIS — Z09 Encounter for follow-up examination after completed treatment for conditions other than malignant neoplasm: Secondary | ICD-10-CM | POA: Diagnosis not present

## 2021-03-29 DIAGNOSIS — E119 Type 2 diabetes mellitus without complications: Secondary | ICD-10-CM | POA: Diagnosis not present

## 2021-03-29 DIAGNOSIS — R69 Illness, unspecified: Secondary | ICD-10-CM | POA: Diagnosis not present

## 2021-03-29 DIAGNOSIS — Z1159 Encounter for screening for other viral diseases: Secondary | ICD-10-CM | POA: Diagnosis not present

## 2021-03-29 DIAGNOSIS — M542 Cervicalgia: Secondary | ICD-10-CM | POA: Diagnosis not present

## 2021-03-29 DIAGNOSIS — E559 Vitamin D deficiency, unspecified: Secondary | ICD-10-CM | POA: Diagnosis not present

## 2021-03-29 DIAGNOSIS — Z79899 Other long term (current) drug therapy: Secondary | ICD-10-CM | POA: Diagnosis not present

## 2021-03-29 DIAGNOSIS — R5383 Other fatigue: Secondary | ICD-10-CM | POA: Diagnosis not present

## 2021-03-29 DIAGNOSIS — R0602 Shortness of breath: Secondary | ICD-10-CM | POA: Diagnosis not present

## 2021-03-29 DIAGNOSIS — I10 Essential (primary) hypertension: Secondary | ICD-10-CM | POA: Diagnosis not present

## 2021-03-29 DIAGNOSIS — E7800 Pure hypercholesterolemia, unspecified: Secondary | ICD-10-CM | POA: Diagnosis not present

## 2021-03-29 DIAGNOSIS — Z9181 History of falling: Secondary | ICD-10-CM | POA: Diagnosis not present

## 2021-03-29 DIAGNOSIS — Z0000 Encounter for general adult medical examination without abnormal findings: Secondary | ICD-10-CM | POA: Diagnosis not present

## 2021-04-01 DIAGNOSIS — I6529 Occlusion and stenosis of unspecified carotid artery: Secondary | ICD-10-CM | POA: Diagnosis not present

## 2021-04-19 ENCOUNTER — Other Ambulatory Visit: Payer: Self-pay | Admitting: Family Medicine

## 2021-04-19 DIAGNOSIS — Z1231 Encounter for screening mammogram for malignant neoplasm of breast: Secondary | ICD-10-CM

## 2021-04-20 DIAGNOSIS — Z1211 Encounter for screening for malignant neoplasm of colon: Secondary | ICD-10-CM | POA: Diagnosis not present

## 2021-04-20 DIAGNOSIS — K219 Gastro-esophageal reflux disease without esophagitis: Secondary | ICD-10-CM | POA: Diagnosis not present

## 2021-04-29 ENCOUNTER — Ambulatory Visit
Admission: RE | Admit: 2021-04-29 | Discharge: 2021-04-29 | Disposition: A | Discharge status: Home / Self Care | Payer: Medicare HMO | Source: Ambulatory Visit | Attending: Family Medicine | Admitting: Family Medicine

## 2021-04-29 ENCOUNTER — Other Ambulatory Visit: Payer: Self-pay

## 2021-04-29 DIAGNOSIS — Z1231 Encounter for screening mammogram for malignant neoplasm of breast: Secondary | ICD-10-CM

## 2021-06-01 DIAGNOSIS — K219 Gastro-esophageal reflux disease without esophagitis: Secondary | ICD-10-CM | POA: Diagnosis not present

## 2021-06-12 IMAGING — RF DG ESOPHAGUS
17 of 22 series · 17 of 24 positions shown · non-contrast
Comparison: Chest CT of February 06, 2021

CLINICAL DATA: Coughing at night, dysplasia

EXAM:
ESOPHOGRAM/BARIUM SWALLOW
TECHNIQUE: Single contrast examination was performed using  thin barium.
FLUOROSCOPY TIME:  Fluoroscopy Time:  3 minutes 54 seconds
Radiation Exposure Index (if provided by the fluoroscopic device):
27.6 mGy
Number of Acquired Spot Images: 0

[Series 1: cp_standard · 0.34mm/px · 1 of 28 frames shown (1 of 17)]
[frame 5/28]
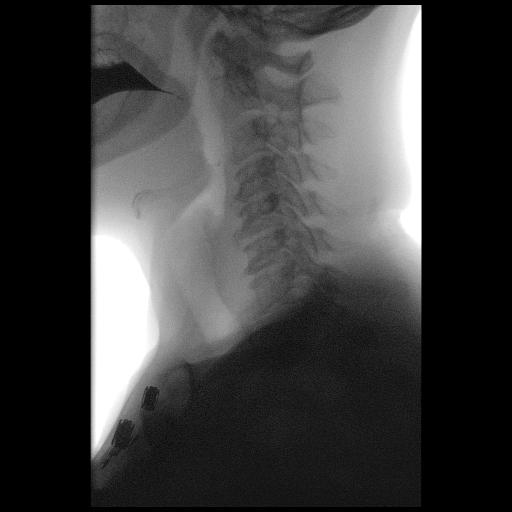

[Series 2: cp_standard · 0.34mm/px · 1 of 30 frames shown (2 of 17)]
[frame 26/30]
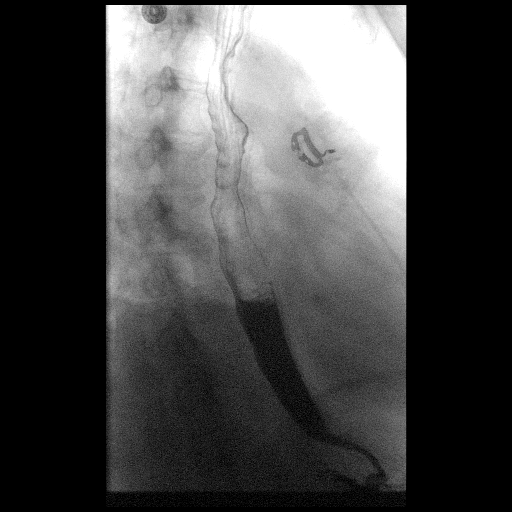

[Series 3: cp_standard · 0.34mm/px · 1 of 4 frames shown (3 of 17)]
[frame 4/4]
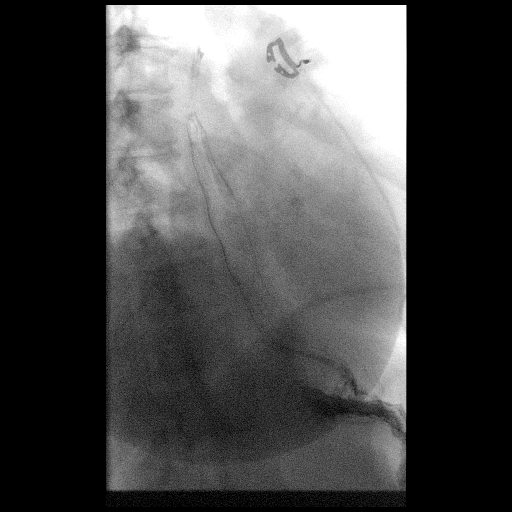

[Series 4: cp_standard · 0.34mm/px · 1 of 36 frames shown (4 of 17)]
[frame 31/36]
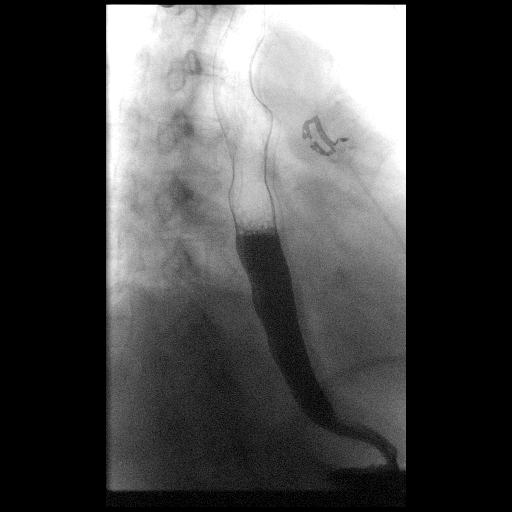

[Series 6: cp_standard · 0.35mm/px · 1 of 6 frames shown (5 of 17)]
[frame 6/6]
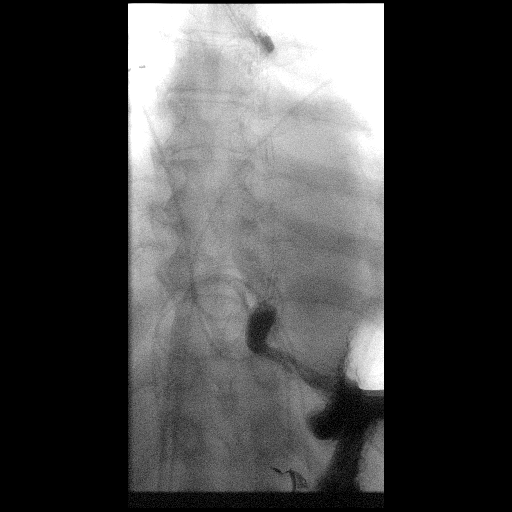

[Series 7: cp_standard · 0.35mm/px · 1 of 27 frames shown (6 of 17)]
[frame 14/27]
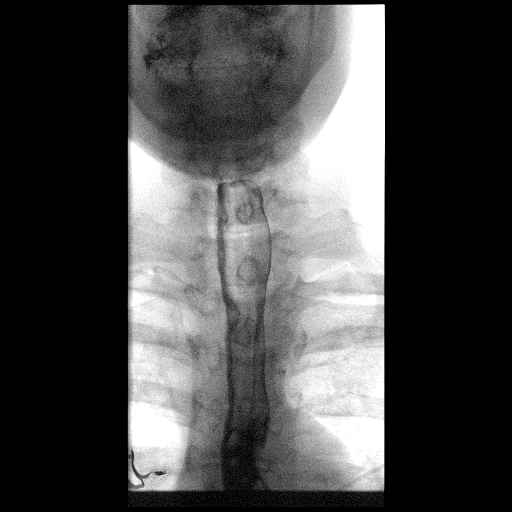

[Series 9: cp_standard · 0.36mm/px · 1 of 30 frames shown (7 of 17)]
[frame 16/30]
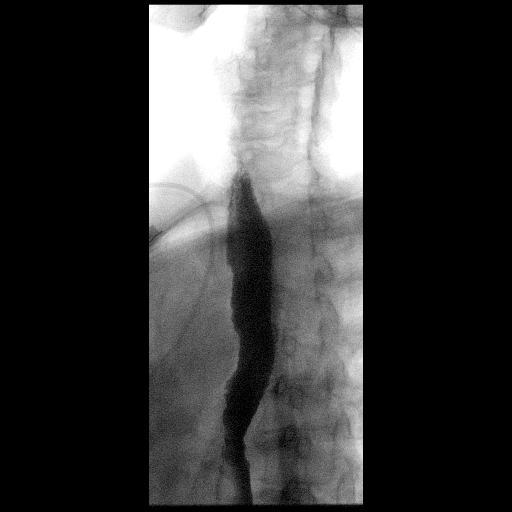

[Series 10: cp_standard · 0.38mm/px · 1 of 14 frames shown (8 of 17)]
[frame 12/14]
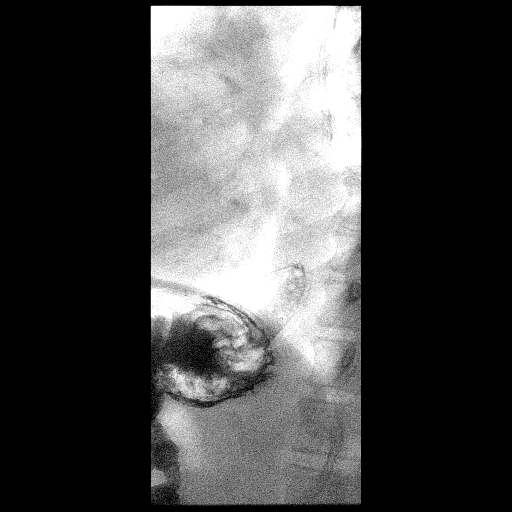

[Series 12: cp_standard · 0.38mm/px · 1 of 18 frames shown (9 of 17)]
[frame 4/18]
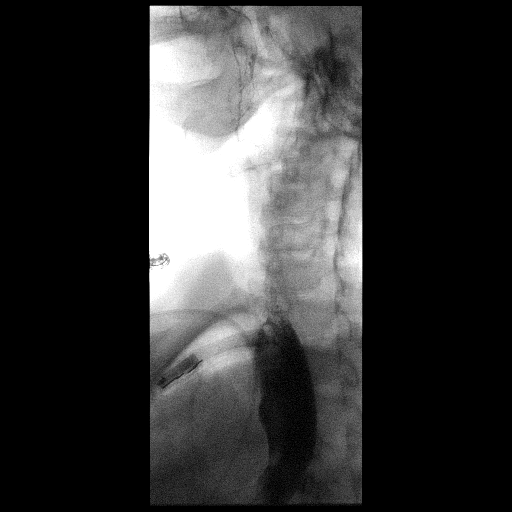

[Series 13: cp_standard · 0.38mm/px · 1 of 8 frames shown (10 of 17)]
[frame 1/8]
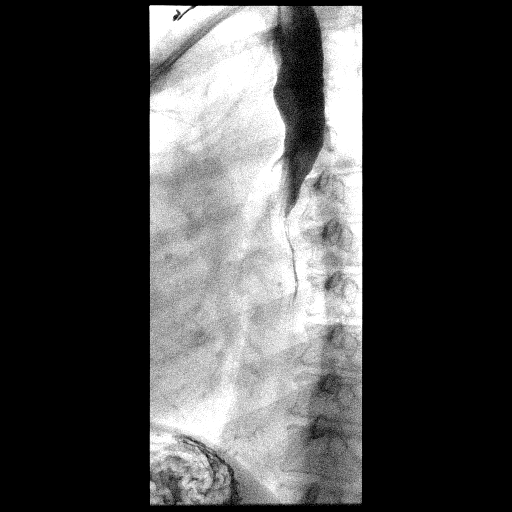

[Series 14: cp_standard · 0.38mm/px · 1 of 9 frames shown (11 of 17)]
[frame 2/9]
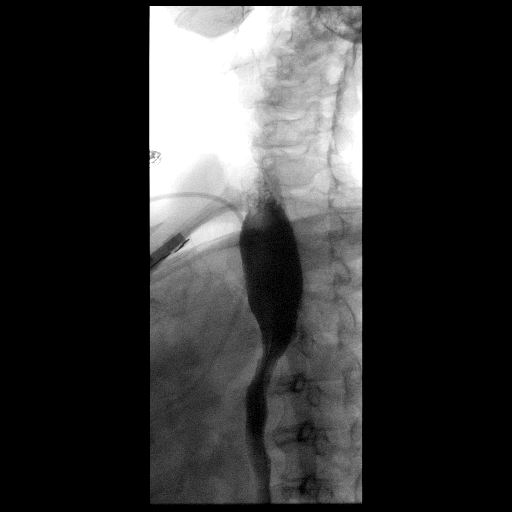

[Series 15: cp_standard · 0.39mm/px · 1 of 34 frames shown (12 of 17)]
[frame 34/34]
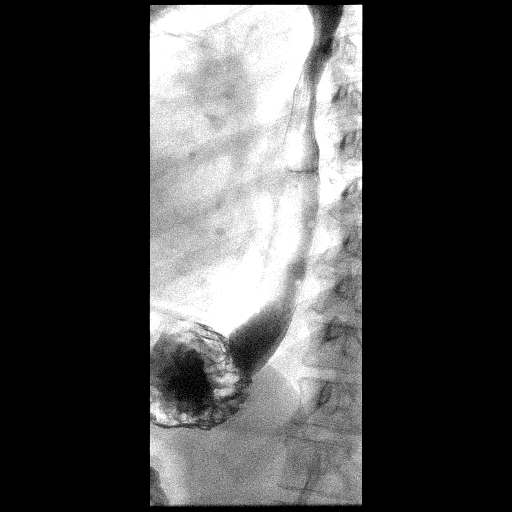

[Series 16: cp_standard · 0.39mm/px · 1 of 6 frames shown (13 of 17)]
[frame 6/6]
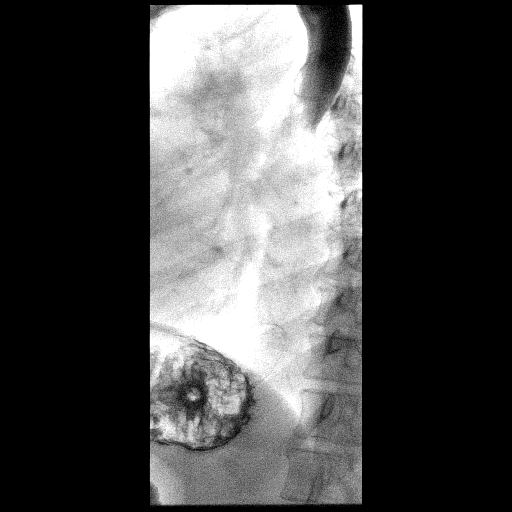

[Series 18: cp_standard · 0.40mm/px · 1 of 6 frames shown (14 of 17)]
[frame 6/6]
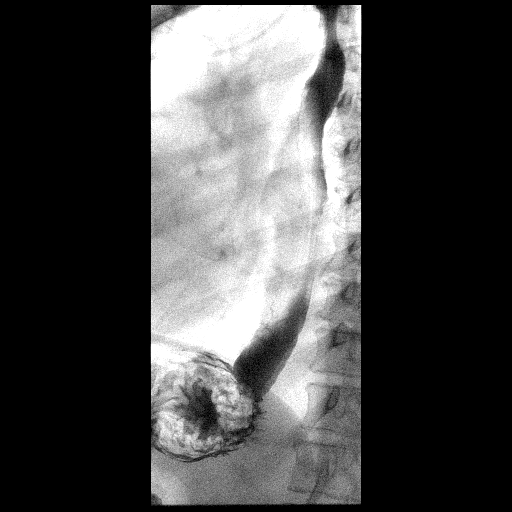

[Series 19: cp_standard · 0.40mm/px · 1 of 14 frames shown (15 of 17)]
[frame 14/14]
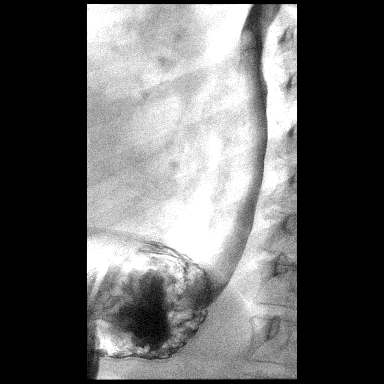

[Series 20: cp_standard · 0.40mm/px · 1 of 19 frames shown (16 of 17)]
[frame 17/19]
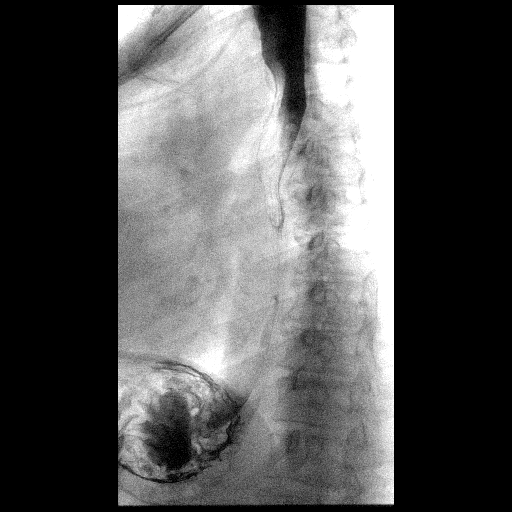

[Series 24: cp_standard · 0.35mm/px · 1 of 6 frames shown (17 of 17)]
[frame 6/6]
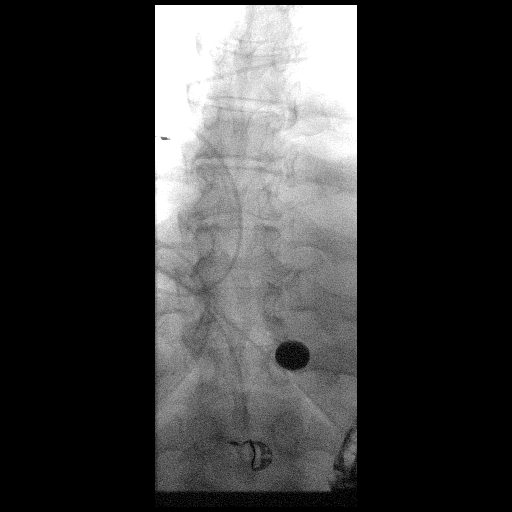

[17 of 24 positions shown; findings below may reference images not displayed]

FINDINGS: Spondylosis of the cervical spine incidentally noted.

Contrast material passes readily through the esophagus and into the
stomach in upright position. Mild aortic impression upon the
esophagus. Mild fold thickening is suggested.

Primary wave is present though with interruption of the primary wave
and proximal escape of the bolus to the cervical esophagus,
associated with tertiary peristaltic activity. Mild tapered
narrowing of the distal esophagus is suggested on full column
assessment with limited distension noted on full column evaluation
distally.

Significant stasis and tertiary peristaltic activity in the
esophagus following full column assessment.

Upon swallowing of barium tablet it was halted at the area of the
distal esophagus just above the GE junction. Several swallows of
thin barium were required to pass the barium tablet.

No gross evidence of gastroesophageal reflux though with significant
stasis in the esophagus following swallows as described.
IMPRESSION: Esophageal dysmotility with suspected signs of esophagitis, limited
assessment on single contrast evaluation but with fold thickening
suggested on the current study.

Dysmotility includes tertiary peristalsis and significant stasis in
the proximal esophagus with reflux into the cervical esophagus from
the lower esophagus.

Esophageal narrowing with transient arrest of the barium tablet at
the distal esophagus.

## 2021-06-29 DIAGNOSIS — E559 Vitamin D deficiency, unspecified: Secondary | ICD-10-CM | POA: Diagnosis not present

## 2021-06-29 DIAGNOSIS — R5383 Other fatigue: Secondary | ICD-10-CM | POA: Diagnosis not present

## 2021-06-29 DIAGNOSIS — Z79899 Other long term (current) drug therapy: Secondary | ICD-10-CM | POA: Diagnosis not present

## 2021-06-29 DIAGNOSIS — E7800 Pure hypercholesterolemia, unspecified: Secondary | ICD-10-CM | POA: Diagnosis not present

## 2021-06-29 DIAGNOSIS — I10 Essential (primary) hypertension: Secondary | ICD-10-CM | POA: Diagnosis not present

## 2021-06-29 DIAGNOSIS — E119 Type 2 diabetes mellitus without complications: Secondary | ICD-10-CM | POA: Diagnosis not present

## 2021-06-29 DIAGNOSIS — Z9181 History of falling: Secondary | ICD-10-CM | POA: Diagnosis not present

## 2021-06-29 DIAGNOSIS — Z1159 Encounter for screening for other viral diseases: Secondary | ICD-10-CM | POA: Diagnosis not present

## 2021-07-02 DIAGNOSIS — M79604 Pain in right leg: Secondary | ICD-10-CM | POA: Diagnosis not present

## 2021-07-02 DIAGNOSIS — M79605 Pain in left leg: Secondary | ICD-10-CM | POA: Diagnosis not present

## 2021-09-30 DIAGNOSIS — E559 Vitamin D deficiency, unspecified: Secondary | ICD-10-CM | POA: Diagnosis not present

## 2021-09-30 DIAGNOSIS — Z9181 History of falling: Secondary | ICD-10-CM | POA: Diagnosis not present

## 2021-09-30 DIAGNOSIS — E119 Type 2 diabetes mellitus without complications: Secondary | ICD-10-CM | POA: Diagnosis not present

## 2021-09-30 DIAGNOSIS — E7800 Pure hypercholesterolemia, unspecified: Secondary | ICD-10-CM | POA: Diagnosis not present

## 2021-09-30 DIAGNOSIS — I10 Essential (primary) hypertension: Secondary | ICD-10-CM | POA: Diagnosis not present

## 2021-09-30 DIAGNOSIS — Z23 Encounter for immunization: Secondary | ICD-10-CM | POA: Diagnosis not present

## 2021-09-30 DIAGNOSIS — Z79899 Other long term (current) drug therapy: Secondary | ICD-10-CM | POA: Diagnosis not present

## 2021-12-24 DIAGNOSIS — Z7985 Long-term (current) use of injectable non-insulin antidiabetic drugs: Secondary | ICD-10-CM | POA: Diagnosis not present

## 2021-12-24 DIAGNOSIS — I10 Essential (primary) hypertension: Secondary | ICD-10-CM | POA: Diagnosis not present

## 2021-12-24 DIAGNOSIS — E1142 Type 2 diabetes mellitus with diabetic polyneuropathy: Secondary | ICD-10-CM | POA: Diagnosis not present

## 2021-12-24 DIAGNOSIS — Z8249 Family history of ischemic heart disease and other diseases of the circulatory system: Secondary | ICD-10-CM | POA: Diagnosis not present

## 2021-12-24 DIAGNOSIS — E785 Hyperlipidemia, unspecified: Secondary | ICD-10-CM | POA: Diagnosis not present

## 2021-12-24 DIAGNOSIS — M1990 Unspecified osteoarthritis, unspecified site: Secondary | ICD-10-CM | POA: Diagnosis not present

## 2021-12-30 DIAGNOSIS — E119 Type 2 diabetes mellitus without complications: Secondary | ICD-10-CM | POA: Diagnosis not present

## 2021-12-30 DIAGNOSIS — E559 Vitamin D deficiency, unspecified: Secondary | ICD-10-CM | POA: Diagnosis not present

## 2021-12-30 DIAGNOSIS — Z9181 History of falling: Secondary | ICD-10-CM | POA: Diagnosis not present

## 2021-12-30 DIAGNOSIS — I10 Essential (primary) hypertension: Secondary | ICD-10-CM | POA: Diagnosis not present

## 2021-12-30 DIAGNOSIS — I739 Peripheral vascular disease, unspecified: Secondary | ICD-10-CM | POA: Diagnosis not present

## 2022-01-20 DIAGNOSIS — M79642 Pain in left hand: Secondary | ICD-10-CM | POA: Diagnosis not present

## 2022-01-20 DIAGNOSIS — M1812 Unilateral primary osteoarthritis of first carpometacarpal joint, left hand: Secondary | ICD-10-CM | POA: Diagnosis not present

## 2022-03-10 DIAGNOSIS — M79642 Pain in left hand: Secondary | ICD-10-CM | POA: Diagnosis not present

## 2022-03-10 DIAGNOSIS — M1812 Unilateral primary osteoarthritis of first carpometacarpal joint, left hand: Secondary | ICD-10-CM | POA: Diagnosis not present

## 2022-05-14 LAB — GLUCOSE, POCT (MANUAL RESULT ENTRY): POC Glucose: 161 mg/dl — AB (ref 70–99)

## 2022-05-14 LAB — POCT GLYCOSYLATED HEMOGLOBIN (HGB A1C): Hemoglobin-A1c: 7.1

## 2022-06-10 ENCOUNTER — Other Ambulatory Visit: Payer: Self-pay | Admitting: Internal Medicine

## 2022-06-10 DIAGNOSIS — Z1231 Encounter for screening mammogram for malignant neoplasm of breast: Secondary | ICD-10-CM

## 2022-07-05 ENCOUNTER — Ambulatory Visit
Admission: RE | Admit: 2022-07-05 | Discharge: 2022-07-05 | Disposition: A | Discharge status: Home / Self Care | Payer: Medicare HMO | Source: Ambulatory Visit | Attending: Internal Medicine | Admitting: Internal Medicine

## 2022-07-05 DIAGNOSIS — Z1231 Encounter for screening mammogram for malignant neoplasm of breast: Secondary | ICD-10-CM

## 2022-08-08 DIAGNOSIS — M170 Bilateral primary osteoarthritis of knee: Secondary | ICD-10-CM | POA: Diagnosis not present

## 2022-08-08 DIAGNOSIS — M545 Low back pain, unspecified: Secondary | ICD-10-CM | POA: Diagnosis not present

## 2022-09-19 DIAGNOSIS — M170 Bilateral primary osteoarthritis of knee: Secondary | ICD-10-CM | POA: Diagnosis not present

## 2022-09-20 DIAGNOSIS — E1142 Type 2 diabetes mellitus with diabetic polyneuropathy: Secondary | ICD-10-CM | POA: Diagnosis not present

## 2022-09-27 DIAGNOSIS — Z1211 Encounter for screening for malignant neoplasm of colon: Secondary | ICD-10-CM | POA: Diagnosis not present

## 2022-09-27 DIAGNOSIS — R262 Difficulty in walking, not elsewhere classified: Secondary | ICD-10-CM | POA: Diagnosis not present

## 2022-09-27 DIAGNOSIS — M25661 Stiffness of right knee, not elsewhere classified: Secondary | ICD-10-CM | POA: Diagnosis not present

## 2022-09-27 DIAGNOSIS — I10 Essential (primary) hypertension: Secondary | ICD-10-CM | POA: Diagnosis not present

## 2022-09-27 DIAGNOSIS — Z23 Encounter for immunization: Secondary | ICD-10-CM | POA: Diagnosis not present

## 2022-09-27 DIAGNOSIS — M6281 Muscle weakness (generalized): Secondary | ICD-10-CM | POA: Diagnosis not present

## 2022-09-27 DIAGNOSIS — Z713 Dietary counseling and surveillance: Secondary | ICD-10-CM | POA: Diagnosis not present

## 2022-09-27 DIAGNOSIS — M1710 Unilateral primary osteoarthritis, unspecified knee: Secondary | ICD-10-CM | POA: Diagnosis not present

## 2022-09-27 DIAGNOSIS — E1142 Type 2 diabetes mellitus with diabetic polyneuropathy: Secondary | ICD-10-CM | POA: Diagnosis not present

## 2022-09-27 DIAGNOSIS — Z471 Aftercare following joint replacement surgery: Secondary | ICD-10-CM | POA: Diagnosis not present

## 2022-10-04 DIAGNOSIS — M25661 Stiffness of right knee, not elsewhere classified: Secondary | ICD-10-CM | POA: Diagnosis not present

## 2022-10-04 DIAGNOSIS — M6281 Muscle weakness (generalized): Secondary | ICD-10-CM | POA: Diagnosis not present

## 2022-10-04 DIAGNOSIS — Z471 Aftercare following joint replacement surgery: Secondary | ICD-10-CM | POA: Diagnosis not present

## 2022-10-04 DIAGNOSIS — R262 Difficulty in walking, not elsewhere classified: Secondary | ICD-10-CM | POA: Diagnosis not present

## 2022-10-31 DIAGNOSIS — M1711 Unilateral primary osteoarthritis, right knee: Secondary | ICD-10-CM | POA: Diagnosis not present

## 2022-11-01 ENCOUNTER — Other Ambulatory Visit (HOSPITAL_COMMUNITY): Payer: Self-pay | Admitting: Orthopedic Surgery

## 2022-11-01 DIAGNOSIS — T84038A Mechanical loosening of other internal prosthetic joint, initial encounter: Secondary | ICD-10-CM

## 2022-11-01 DIAGNOSIS — Z96659 Presence of unspecified artificial knee joint: Secondary | ICD-10-CM

## 2022-11-10 ENCOUNTER — Encounter: Payer: Self-pay | Admitting: Gastroenterology

## 2022-11-11 ENCOUNTER — Encounter (HOSPITAL_COMMUNITY)
Admission: RE | Admit: 2022-11-11 | Discharge: 2022-11-11 | Disposition: A | Discharge status: Home / Self Care | Payer: Medicare HMO | Source: Ambulatory Visit | Attending: Orthopedic Surgery | Admitting: Orthopedic Surgery

## 2022-11-11 DIAGNOSIS — Z96659 Presence of unspecified artificial knee joint: Secondary | ICD-10-CM | POA: Insufficient documentation

## 2022-11-11 DIAGNOSIS — T8459XA Infection and inflammatory reaction due to other internal joint prosthesis, initial encounter: Secondary | ICD-10-CM | POA: Diagnosis not present

## 2022-11-11 DIAGNOSIS — Z96651 Presence of right artificial knee joint: Secondary | ICD-10-CM | POA: Diagnosis not present

## 2022-11-11 DIAGNOSIS — T84038A Mechanical loosening of other internal prosthetic joint, initial encounter: Secondary | ICD-10-CM | POA: Insufficient documentation

## 2022-11-11 DIAGNOSIS — R948 Abnormal results of function studies of other organs and systems: Secondary | ICD-10-CM | POA: Diagnosis not present

## 2022-11-11 DIAGNOSIS — Z471 Aftercare following joint replacement surgery: Secondary | ICD-10-CM | POA: Diagnosis not present

## 2022-11-11 MED ORDER — TECHNETIUM TC 99M MEDRONATE IV KIT
20.0000 | PACK | Freq: Once | INTRAVENOUS | Status: AC | PRN
  Administered 2022-11-11: 19.5 via INTRAVENOUS

## 2022-11-14 DIAGNOSIS — M1711 Unilateral primary osteoarthritis, right knee: Secondary | ICD-10-CM | POA: Diagnosis not present

## 2022-11-14 DIAGNOSIS — M25561 Pain in right knee: Secondary | ICD-10-CM | POA: Diagnosis not present

## 2022-11-16 NOTE — H&P (Signed)
KNEE ARTHROPLASTY ADMISSION H&P  Patient ID: Lori Bennett MRN: [id number] DOB/AGE: [birth year] 67 y.o.  Chief Complaint: right knee pain.  Planned Procedure Date: 12/06/22 Medical and Cardiac Clearance by Tami Ribas     HPI: Lori Bennett is a 67 y.o. female who presents for evaluation of RIGHT UNICOMPARTMENTAL KNEE JOINT PROSTESIS LOOSENING. The patient has a history of pain and functional disability in the right knee due to arthritis and has failed non-surgical conservative treatments for greater than 12 weeks to include NSAID's and/or analgesics, corticosteriod injections, supervised PT with diminished ADL's post treatment, use of assistive devices, and activity modification.  Onset of symptoms was gradual, starting 1 years ago with rapidlly worsening course since that time. The patient noted prior procedures on the knee to include  unicompartmental arthroplasty on the right knee.  Patient currently rates pain at 10 out of 10 with activity. Patient has night pain, worsening of pain with activity and weight bearing, and pain that interferes with activities of daily living.  Patient has evidence of  loosening of the medial tibial plateau prosthesis on bone scan and extension of narrowing, sclerosis, and osteophytes to the rest of the knee joint  by imaging studies.  There is no active infection.  Past Medical History:  Diagnosis Date   Arthritis    Diabetes mellitus without complication ([place])    type 2 , does not check cbg at home    History of blood transfusion 30 years ago   after vaginal delivery    HTN (hypertension)    Nerve pain    due to nerve pain brought on by prior right knee surgery    Noncompliance with medications    PONV (postoperative nausea and vomiting)    Past Surgical History:  Procedure Laterality Date   BALLOON DILATION N/A 02/09/2021   Procedure: BALLOON DILATION;  Surgeon: Mauri Pole, MD;  Location: [facility];  Service: Endoscopy;  Laterality: N/A;    COLONOSCOPY  2015   Dr Collene Mares   ESOPHAGOGASTRODUODENOSCOPY (EGD) WITH PROPOFOL N/A 02/09/2021   Procedure: ESOPHAGOGASTRODUODENOSCOPY (EGD) WITH PROPOFOL;  Surgeon: Mauri Pole, MD;  Location: [facility];  Service: Endoscopy;  Laterality: N/A;   PARTIAL KNEE ARTHROPLASTY Right 05/30/2018   done at [facility] surgery center by dr tim murphy   partial knee revision  Right 06/14/2018   ROTATOR CUFF REPAIR Right    TOTAL KNEE ARTHROPLASTY Left 04/30/2019   Procedure: TOTAL KNEE ARTHROPLASTY;  Surgeon: Renette Butters, MD;  Location: [facility];  Service: Orthopedics;  Laterality: Left;   TUBAL LIGATION     Allergies  Allergen Reactions   Ace Inhibitors Cough   Clonidine Derivatives Other (See Comments)    Severe dry mouth and lethargy.   Prior to Admission medications   Medication Sig Start Date End Date Taking? Authorizing Provider  amLODipine (NORVASC) 5 MG tablet Take 1 tablet (5 mg total) by mouth 2 (two) times daily. NEED OV. Patient taking differently: Take 5 mg by mouth 2 (two) times daily. 06/18/18   [place], Peter M, MD  carvedilol (COREG) 12.5 MG tablet Take 1 tablet (12.5 mg total) by mouth 2 (two) times daily with a meal. NEED OV. Patient taking differently: Take 12.5 mg by mouth 2 (two) times daily with a meal. 06/18/18   [place], Peter M, MD  dapagliflozin propanediol (FARXIGA) 5 MG TABS tablet Take 5 mg by mouth every morning.    [provider]  Dulaglutide (TRULICITY) 1.95 KD/3.2IZ SOPN Inject 0.75 mg into the  skin every Friday.    [provider]  famotidine (PEPCID) 20 MG tablet Take 1 tablet (20 mg total) by mouth 2 (two) times daily. 02/08/21 04/10/21  [place] Pho, MD  hydrALAZINE (APRESOLINE) 25 MG tablet Take 1 tablet (25 mg total) by mouth 2 (two) times daily. NEED OV. Patient taking differently: Take 25 mg by mouth 2 (two) times daily. 06/18/18   [place], Peter M, MD  pantoprazole (PROTONIX) 40 MG tablet Take 1 tablet (40 mg total) by mouth daily  at 6 (six) AM. Take for two weeks only. 02/10/21   Fayette Pho, MD  Vitamin D, Ergocalciferol, (DRISDOL) 1.25 MG (50000 UT) CAPS capsule Take 50,000 Units by mouth every 7 (seven) days. Mondays Patient not taking: No sig reported    [provider]   Social History   Socioeconomic History   Marital status: Single    Spouse name: Not on file   Number of children: 2   Years of education: Not on file   Highest education level: Not on file  Occupational History   Occupation: Fish farm manager in a Engineer, maintenance (IT)  Tobacco Use   Smoking status: Never   Smokeless tobacco: Never  Vaping Use   Vaping Use: Never used  Substance and Sexual Activity   Alcohol use: No    Alcohol/week: 0.0 standard drinks of alcohol   Drug use: No   Sexual activity: Not on file  Other Topics Concern   Not on file  Social History Narrative   Not on file   Social Determinants of Health   Financial Resource Strain: Not on file  Food Insecurity: Not on file  Transportation Needs: Not on file  Physical Activity: Not on file  Stress: Not on file  Social Connections: Not on file   Family History  Problem Relation Age of Onset   Hypertension Mother     ROS: Currently denies lightheadedness, dizziness, Fever, chills, CP, SOB.   No personal history of DVT, PE, MI, or CVA. Full dentures All other systems have been reviewed and were otherwise currently negative with the exception of those mentioned in the HPI and as above.  Objective: Vitals: Ht: 5'4" Wt: 159 lbs Temp: 97.7 BP: 159/80 Pulse: 66 O2 96% on room air.   Physical Exam: General: Alert, NAD.  Antalgic Gait  HEENT: EOMI, Good Neck Extension  Pulm: No increased work of breathing.  Clear B/L A/P w/o crackle or wheeze.  CV: RRR, No m/g/r appreciated  GI: soft, NT, ND. BS x 4 quadrants Neuro: CN II-XII grossly intact without focal deficit.  Sensation intact distally Skin: No lesions in the area of chief complaint MSK/Surgical Site:   + JLT.  ROM 0-110 degrees.  5/5 strength in extension and flexion.  +EHL/FHL.  NVI.  Stable varus and valgus stress. Mild effusion.   Imaging Review Plain radiographs demonstrate severe degenerative joint disease of the right knee.   The overall alignment ismild valgus. The bone quality appears to be fair for age and reported activity level.  Preoperative templating of the joint replacement has been completed, documented, and submitted to the Operating Room personnel in order to optimize intra-operative equipment management.  Assessment: RIGHT UNICOMPARTMENTAL KNEE JOINT PROSTESIS LOOSENING Active Problems:   * No active hospital problems. *   Plan: Plan for Procedure(s): CONVERSION TO TOTAL KNEE  The patient history, physical exam, clinical judgement of the provider and imaging are consistent with end stage degenerative joint disease and conversion of partial to total joint arthroplasty  is deemed medically necessary. The treatment options including medical management, injection therapy, and arthroplasty were discussed at length. The risks and benefits of Procedure(s): CONVERSION TO TOTAL KNEE were presented and reviewed.  The risks of nonoperative treatment, versus surgical intervention including but not limited to continued pain, aseptic loosening, stiffness, dislocation/subluxation, infection, bleeding, nerve injury, blood clots, cardiopulmonary complications, morbidity, mortality, among others were discussed. The patient verbalizes understanding and wishes to proceed with the plan.  Patient is being admitted for inpatient treatment for surgery, pain control, PT, prophylactic antibiotics, VTE prophylaxis, progressive ambulation, ADL's and discharge planning. She will spend at least 1 night in the hospital.   The patient does meet the criteria for TXA which will be used perioperatively.   ASA 81 mg BID will be used postoperatively for DVT prophylaxis in addition to SCDs, and early  ambulation. Plan for Tylenol, Mobic, oxycodone for pain.   Robaxin for muscle spasms.   Zofran for nausea and vomiting. Senokot is for constipation prevention. Pharmacy- [facility] at [facility] The patient is planning to be discharged home with [place] and into the care of her sister Rollene Fare who can be reached at [phone] Follow up appt 12/16/22 at 11:30am     Alisa Graff Office [phone] 11/16/2022 6:02 PM

## 2022-11-18 DIAGNOSIS — M1711 Unilateral primary osteoarthritis, right knee: Secondary | ICD-10-CM | POA: Diagnosis not present

## 2022-11-21 DIAGNOSIS — I10 Essential (primary) hypertension: Secondary | ICD-10-CM | POA: Diagnosis not present

## 2022-11-21 DIAGNOSIS — E1142 Type 2 diabetes mellitus with diabetic polyneuropathy: Secondary | ICD-10-CM | POA: Diagnosis not present

## 2022-11-21 DIAGNOSIS — M1710 Unilateral primary osteoarthritis, unspecified knee: Secondary | ICD-10-CM | POA: Diagnosis not present

## 2022-11-21 DIAGNOSIS — Z01818 Encounter for other preprocedural examination: Secondary | ICD-10-CM | POA: Diagnosis not present

## 2022-11-21 NOTE — Patient Instructions (Addendum)
SURGICAL WAITING ROOM VISITATION Patients having surgery or a procedure may have no more than 2 support people in the waiting area - these visitors may rotate.    If the patient needs to stay at the hospital during part of their recovery, the visitor guidelines for inpatient rooms apply. Pre-op nurse will coordinate an appropriate time for 1 support person to accompany patient in pre-op.  This support person may not rotate.    Please refer to the [facility] website for the visitor guidelines for Inpatients (after your surgery is over and you are in a regular room).   Due to an increase in RSV and influenza rates and associated hospitalizations, children ages 15 and under may not visit patients in [place].     Your procedure is scheduled on: 12-06-22   Report to [facility] Main Entrance    Report to admitting at 7:30 AM   Call this number if you have problems the morning of surgery [phone]   Do not eat food :After Midnight.   After Midnight you may have the following liquids until 7:00 AM/ DAY OF SURGERY  Water Non-Citrus Juices (without pulp, NO RED) Carbonated Beverages Black Coffee (NO MILK/CREAM OR CREAMERS, sugar ok)  Clear Tea (NO MILK/CREAM OR CREAMERS, sugar ok) regular and decaf                             Plain Jell-O (NO RED)                                           Fruit ices (not with fruit pulp, NO RED)                                     Popsicles (NO RED)                                                               Sports drinks like Gatorade (NO RED)                   The day of surgery:  Drink ONE (1) Pre-Surgery G2 at 7:00 AM the morning of surgery. Drink in one sitting. Do not sip.  This drink was given to you during your hospital  pre-op appointment visit. Nothing else to drink after completing the Pre-Surgery G2.          If you have questions, please contact your surgeon's office.   FOLLOW  ANY ADDITIONAL PRE OP  INSTRUCTIONS YOU RECEIVED FROM YOUR SURGEON'S OFFICE!!!     Oral Hygiene is also important to reduce your risk of infection.                                    Remember - BRUSH YOUR TEETH THE MORNING OF SURGERY WITH YOUR REGULAR TOOTHPASTE   Do NOT smoke after Midnight   Take these medicines the morning of surgery with A SIP OF WATER:   Amlodipine  Carvedilol   How to Manage Your Diabetes Before and After Surgery  Why is it important to control my blood sugar before and after surgery? Improving blood sugar levels before and after surgery helps healing and can limit problems. A way of improving blood sugar control is eating a healthy diet by:  Eating less sugar and carbohydrates  Increasing activity/exercise  Talking with your doctor about reaching your blood sugar goals High blood sugars (greater than 180 mg/dL) can raise your risk of infections and slow your recovery, so you will need to focus on controlling your diabetes during the weeks before surgery. Make sure that the doctor who takes care of your diabetes knows about your planned surgery including the date and location.  How do I manage my blood sugar before surgery? Check your blood sugar at least 4 times a day, starting 2 days before surgery, to make sure that the level is not too high or low. Check your blood sugar the morning of your surgery when you wake up and every 2 hours until you get to the Short Stay unit. If your blood sugar is less than 70 mg/dL, you will need to treat for low blood sugar: Do not take insulin. Treat a low blood sugar (less than 70 mg/dL) with  cup of clear juice (cranberry or apple), 4 glucose tablets, OR glucose gel. Recheck blood sugar in 15 minutes after treatment (to make sure it is greater than 70 mg/dL). If your blood sugar is not greater than 70 mg/dL on recheck, call [phone] for further instructions. Report your blood sugar to the short stay nurse when you get to Short Stay.  If you  are admitted to the hospital after surgery: Your blood sugar will be checked by the staff and you will probably be given insulin after surgery (instead of oral diabetes medicines) to make sure you have good blood sugar levels. The goal for blood sugar control after surgery is 80-180 mg/dL.   WHAT DO I DO ABOUT MY DIABETES MEDICATION?  Do not take oral diabetes medicines (pills) the morning of surgery.          Hold Trulicity 7 days before surgery (do not take after 11-28-22)  DO NOT TAKE THE FOLLOWING 7 DAYS PRIOR TO SURGERY: Ozempic, Wegovy, Rybelsus (Semaglutide), Byetta (exenatide), Bydureon (exenatide ER), Victoza, Saxenda (liraglutide), or Trulicity (dulaglutide) Mounjaro (Tirzepatide) Adlyxin (Lixisenatide), Polyethylene Glycol Loxenatide.  Reviewed and Endorsed by [facility] Patient Education Committee, August 2015SURGERY                              You may not have any metal on your body including hair pins, jewelry, and body piercing             Do not wear make-up, lotions, powders, perfumes or deodorant  Do not wear nail polish including gel and S&S, artificial/acrylic nails, or any other type of covering on natural nails including finger and toenails. If you have artificial nails, gel coating, etc. that needs to be removed by a nail salon please have this removed prior to surgery or surgery may need to be canceled/ delayed if the surgeon/ anesthesia feels like they are unable to be safely monitored.   Do not shave  48 hours prior to surgery.        Do not bring valuables to the hospital. [place].   Contacts, dentures or bridgework  may not be worn into surgery.   Bring small overnight bag day of surgery.   DO NOT [place]. PHARMACY WILL DISPENSE MEDICATIONS LISTED ON YOUR MEDICATION LIST TO YOU DURING YOUR ADMISSION [place]!               Please read over the following fact sheets you were  given: IF [place]  If you received a COVID test during your pre-op visit  it is requested that you wear a mask when out in public, stay away from anyone that may not be feeling well and notify your surgeon if you develop symptoms. If you test positive for Covid or have been in contact with anyone that has tested positive in the last 10 days please notify you surgeon.  [place] - Preparing for Surgery Before surgery, you can play an important role.  Because skin is not sterile, your skin needs to be as free of germs as possible.  You can reduce the number of germs on your skin by washing with CHG (chlorahexidine gluconate) soap before surgery.  CHG is an antiseptic cleaner which kills germs and bonds with the skin to continue killing germs even after washing. Please DO NOT use if you have an allergy to CHG or antibacterial soaps.  If your skin becomes reddened/irritated stop using the CHG and inform your nurse when you arrive at Short Stay. Do not shave (including legs and underarms) for at least 48 hours prior to the first CHG shower.  You may shave your face/neck.  Please follow these instructions carefully:  1.  Shower with CHG Soap the night before surgery and the  morning of surgery.  2.  If you choose to wash your hair, wash your hair first as usual with your normal  shampoo.  3.  After you shampoo, rinse your hair and body thoroughly to remove the shampoo.                             4.  Use CHG as you would any other liquid soap.  You can apply chg directly to the skin and wash.  Gently with a scrungie or clean washcloth.  5.  Apply the CHG Soap to your body ONLY FROM THE NECK DOWN.   Do   not use on face/ open                           Wound or open sores. Avoid contact with eyes, ears mouth and   genitals (private parts).                       Wash face,  Genitals (private parts) with your normal soap.             6.   Wash thoroughly, paying special attention to the area where your    surgery  will be performed.  7.  Thoroughly rinse your body with warm water from the neck down.  8.  DO NOT shower/wash with your normal soap after using and rinsing off the CHG Soap.                9.  Pat yourself dry with a clean towel.            10.  Wear clean  pajamas.            11.  Place clean sheets on your bed the night of your first shower and do not  sleep with pets. Day of Surgery : Do not apply any lotions/deodorants the morning of surgery.  Please wear clean clothes to the hospital/surgery center.  FAILURE TO FOLLOW THESE INSTRUCTIONS MAY RESULT IN THE CANCELLATION OF YOUR SURGERY  PATIENT SIGNATURE_________________________________  NURSE SIGNATURE__________________________________  ________________________________________________________________________    Adam [place]ix  An incentive spirometer is a tool that can help keep your lungs clear and active. This tool measures how well you are filling your lungs with each breath. Taking long deep breaths may help reverse or decrease the chance of developing breathing (pulmonary) problems (especially infection) following: A long period of time when you are unable to move or be active. BEFORE THE PROCEDURE  If the spirometer includes an indicator to show your best effort, your nurse or respiratory therapist will set it to a desired goal. If possible, sit up straight or lean slightly forward. Try not to slouch. Hold the incentive spirometer in an upright position. INSTRUCTIONS FOR USE  Sit on the edge of your bed if possible, or sit up as far as you can in bed or on a chair. Hold the incentive spirometer in an upright position. Breathe out normally. Place the mouthpiece in your mouth and seal your lips tightly around it. Breathe in slowly and as deeply as possible, raising the piston or the ball toward the top of the column. Hold your breath for 3-5  seconds or for as long as possible. Allow the piston or ball to fall to the bottom of the column. Remove the mouthpiece from your mouth and breathe out normally. Rest for a few seconds and repeat Steps 1 through 7 at least 10 times every 1-2 hours when you are awake. Take your time and take a few normal breaths between deep breaths. The spirometer may include an indicator to show your best effort. Use the indicator as a goal to work toward during each repetition. After each set of 10 deep breaths, practice coughing to be sure your lungs are clear. If you have an incision (the cut made at the time of surgery), support your incision when coughing by placing a pillow or rolled up towels firmly against it. Once you are able to get out of bed, walk around indoors and cough well. You may stop using the incentive spirometer when instructed by your caregiver.  RISKS AND COMPLICATIONS Take your time so you do not get dizzy or light-headed. If you are in pain, you may need to take or ask for pain medication before doing incentive spirometry. It is harder to take a deep breath if you are having pain. AFTER USE Rest and breathe slowly and easily. It can be helpful to keep track of a log of your progress. Your caregiver can provide you with a simple table to help with this. If you are using the spirometer at home, follow these instructions: [place] IF:  You are having difficultly using the spirometer. You have trouble using the spirometer as often as instructed. Your pain medication is not giving enough relief while using the spirometer. You develop fever of 100.5 F (38.1 C) or higher. SEEK IMMEDIATE MEDICAL CARE IF:  You cough up bloody sputum that had not been present before. You develop fever of 102 F (38.9 C) or greater. You develop worsening pain at or near the incision  site. MAKE SURE YOU:  Understand these instructions. Will watch your condition. Will get help right away if you are  not doing well or get worse. Document Released: 02/20/2007 Document Revised: 01/02/2012 Document Reviewed: 04/23/2007 [facility] Patient Information 2014 [place], [place].   ________________________________________________________________________

## 2022-11-21 NOTE — Progress Notes (Addendum)
COVID Vaccine Completed:  Yes  Date of COVID positive in last 90 days:  No  PCP - Cristie Hem, MD Cardiologist - Peter Martinique, MD for HTN (last OV 2018)  Chest x-ray - N/A EKG - 11-25-22 Epic Stress Test - N/A ECHO - 02-07-21 Epic Cardiac Cath - N/A Pacemaker/ICD device last checked: Spinal Cord Stimulator:N/A  Bowel Prep - N/A  Sleep Study - N/A CPAP -   Fasting Blood Sugar - 104 to 140 Checks Blood Sugar - 1 time a day  Trulicity (Tuesdays) Last dose of GLP1 agonist-  N/A GLP1 instructions:  Do not take after 11-28-22   Last dose of SGLT-2 inhibitors-  N/A SGLT-2 instructions:  Blood Thinner Instructions:  N/A Aspirin Instructions: Last Dose:  Activity level:  Can go up a flight of stairs and perform activities of daily living without stopping and without symptoms of chest pain or shortness of breath. Some difficulty due to knee pain.  Anesthesia review: Potassium 2.8 on PAT labs  Patient denies shortness of breath, fever, cough and chest pain at PAT appointment  Patient verbalized understanding of instructions that were given to them at the PAT appointment. Patient was also instructed that they will need to review over the PAT instructions again at home before surgery.

## 2022-11-24 ENCOUNTER — Ambulatory Visit: Payer: Medicare HMO | Admitting: Gastroenterology

## 2022-11-24 ENCOUNTER — Encounter: Payer: Self-pay | Admitting: Gastroenterology

## 2022-11-24 VITALS — BP 140/66 | HR 80 | Ht 63.0 in | Wt 158.0 lb

## 2022-11-24 DIAGNOSIS — Z1211 Encounter for screening for malignant neoplasm of colon: Secondary | ICD-10-CM

## 2022-11-24 NOTE — Patient Instructions (Signed)
X   ORAL DIABETIC MEDICATION INSTRUCTIONS  The day before your procedure:  Take your diabetic pill as you do normally  The day of your procedure:  Do not take your diabetic pill   We will check your blood sugar levels during the admission process and again in Recovery before discharging you home                 _ _ OTHER NON-INSULIN INJECTABLE MEDICATIONS         (Tanzeum, Trulicity, Byetta, Victoza, Bydureon, SymlinPen, Ozempic)  Hold the am of the procedure    You have been scheduled for a colonoscopy. Please follow written instructions given to you at your visit today.  Please pick up your prep supplies at the pharmacy within the next 1-3 days. If you use inhalers (even only as needed), please bring them with you on the day of your procedure.   Due to recent changes in healthcare laws, you may see the results of your imaging and laboratory studies on MyChart before your provider has had a chance to review them.  We understand that in some cases there may be results that are confusing or concerning to you. Not all laboratory results come back in the same time frame and the provider may be waiting for multiple results in order to interpret others.  Please give [place] 48 hours in order for your provider to thoroughly review all the results before contacting the office for clarification of your results.    We have sent the following medications to your pharmacy for you to pick up at your convenience:  Suprep  _______________________________________________________  If your blood pressure at your visit was 140/90 or greater, please contact your primary care physician to follow up on this.  _______________________________________________________  If you are age 68 or older, your body mass index should be between 23-30. Your Body mass index is 27.99 kg/m. If this is out of the aforementioned range listed, please consider follow up with your Primary Care Provider.  If you are age 54 or  younger, your body mass index should be between 19-25. Your Body mass index is 27.99 kg/m. If this is out of the aformentioned range listed, please consider follow up with your Primary Care Provider.   ________________________________________________________  The [facility] providers would like to encourage you to use [facility] to communicate with providers for non-urgent requests or questions.  Due to long hold times on the telephone, sending your provider a message by [facility] may be a faster and more efficient way to get a response.  Please allow 48 business hours for a response.  Please remember that this is for non-urgent requests.  _______________________________________________________   I appreciate the  opportunity to care for you  Thank You   Harl Bowie , MD

## 2022-11-24 NOTE — Progress Notes (Signed)
Lori Bennett    [id number]    [birth year]  Primary Care Physician:Wile, Jesse Sans, MD  Referring Physician: Michael Boston, [address] [place],  [place] [id number]   Chief complaint: Colon cancer screening  HPI:  67 year old very pleasant female here for new patient visit to discuss colon cancer screening Denies any nausea, vomiting, abdominal pain, melena or bright red blood per rectum No family history of colon cancer  She was having significant diarrhea when she was initially started on metformin but has not had any issues recently, on average she has 1-2 soft bowel movement daily.  She is scheduled to undergo knee replacement surgery in 2 weeks, would like to schedule colonoscopy once she has recovered from it.   Outpatient Encounter Medications as of 11/24/2022  Medication Sig   amLODipine (NORVASC) 5 MG tablet Take 1 tablet (5 mg total) by mouth 2 (two) times daily. NEED OV. (Patient taking differently: Take 5 mg by mouth daily.)   atorvastatin (LIPITOR) 20 MG tablet Take 20 mg by mouth at bedtime.   carvedilol (COREG) 12.5 MG tablet Take 1 tablet (12.5 mg total) by mouth 2 (two) times daily with a meal. NEED OV. (Patient taking differently: Take 12.5 mg by mouth 2 (two) times daily with a meal.)   Dulaglutide 1.5 MG/0.5ML SOPN Inject 1.5 mg into the skin every 7 (seven) days.   gabapentin (NEURONTIN) 300 MG capsule Take 300 mg by mouth at bedtime as needed (pain).   irbesartan (AVAPRO) 150 MG tablet Take 150 mg by mouth daily.   meloxicam (MOBIC) 15 MG tablet Take 15 mg by mouth daily as needed for pain.   metFORMIN (GLUCOPHAGE-XR) 500 MG 24 hr tablet Take 500 mg by mouth daily after supper.   [DISCONTINUED] famotidine (PEPCID) 20 MG tablet Take 1 tablet (20 mg total) by mouth 2 (two) times daily.   [DISCONTINUED] hydrALAZINE (APRESOLINE) 25 MG tablet Take 1 tablet (25 mg total) by mouth 2 (two) times daily. NEED OV. (Patient not taking: Reported on 11/23/2022)    [DISCONTINUED] pantoprazole (PROTONIX) 40 MG tablet Take 1 tablet (40 mg total) by mouth daily at 6 (six) AM. Take for two weeks only. (Patient not taking: Reported on 11/23/2022)   No facility-administered encounter medications on file as of 11/24/2022.    Allergies as of 11/24/2022 - Review Complete 11/11/2022  Allergen Reaction Noted   Ace inhibitors Cough 01/27/2011   Clonidine derivatives Other (See Comments) 02/01/2011    Past Medical History:  Diagnosis Date   Arthritis    Diabetes mellitus without complication (HCC)    type 2 , does not check cbg at home    History of blood transfusion 30 years ago   after vaginal delivery    HTN (hypertension)    Nerve pain    due to nerve pain brought on by prior right knee surgery    Noncompliance with medications    PONV (postoperative nausea and vomiting)     Past Surgical History:  Procedure Laterality Date   BALLOON DILATION N/A 02/09/2021   Procedure: BALLOON DILATION;  Surgeon: Mauri Pole, MD;  Location: [facility];  Service: Endoscopy;  Laterality: N/A;   COLONOSCOPY  2015   Dr Collene Mares   ESOPHAGOGASTRODUODENOSCOPY (EGD) WITH PROPOFOL N/A 02/09/2021   Procedure: ESOPHAGOGASTRODUODENOSCOPY (EGD) WITH PROPOFOL;  Surgeon: Mauri Pole, MD;  Location: [facility];  Service: Endoscopy;  Laterality: N/A;   PARTIAL KNEE ARTHROPLASTY Right 05/30/2018  done at [facility] by dr tim murphy   partial knee revision  Right 06/14/2018   ROTATOR CUFF REPAIR Right    TOTAL KNEE ARTHROPLASTY Left 04/30/2019   Procedure: TOTAL KNEE ARTHROPLASTY;  Surgeon: Renette Butters, MD;  Location: [facility];  Service: Orthopedics;  Laterality: Left;   TUBAL LIGATION      Family History  Problem Relation Age of Onset   Hypertension Mother     Social History   Socioeconomic History   Marital status: Single    Spouse name: Not on file   Number of children: 2   Years of education: Not on file   Highest education level: Not  on file  Occupational History   Occupation: Gaffer in a Personal assistant  Tobacco Use   Smoking status: Never   Smokeless tobacco: Never  Vaping Use   Vaping Use: Never used  Substance and Sexual Activity   Alcohol use: No    Alcohol/week: 0.0 standard drinks of alcohol   Drug use: No   Sexual activity: Not on file  Other Topics Concern   Not on file  Social History Narrative   Not on file   Social Determinants of Health   Financial Resource Strain: Not on file  Food Insecurity: Not on file  Transportation Needs: Not on file  Physical Activity: Not on file  Stress: Not on file  Social Connections: Not on file  Intimate Partner Violence: Not on file      Review of systems: All other review of systems negative except as mentioned in the HPI.   Physical Exam: Vitals:   11/24/22 0858  BP: (Abnormal) 140/66  Pulse: 80   Body mass index is 27.99 kg/m. Gen:      No acute distress HEENT:  sclera anicteric CV: S1-S2 regular Lungs: Bilateral clear Abd:      soft, non-tender; no palpable masses, no distension Ext:    No edema Neuro: alert and oriented x 3 Psych: normal mood and affect  Data Reviewed:  Reviewed labs, radiology imaging, old records and pertinent past GI work up   Assessment and Plan/Recommendations:  67 year old very pleasant female here to discuss colon cancer screening, average risk We will schedule for colonoscopy The risks and benefits as well as alternatives of endoscopic procedure(s) have been discussed and reviewed. All questions answered. The patient agrees to proceed.   The patient was provided an opportunity to ask questions and all were answered. The patient agreed with the plan and demonstrated an understanding of the instructions.  Damaris Hippo , MD    CC: Michael Boston, MD

## 2022-11-25 ENCOUNTER — Encounter (HOSPITAL_COMMUNITY)
Admission: RE | Admit: 2022-11-25 | Discharge: 2022-11-25 | Disposition: A | Discharge status: Home / Self Care | Payer: Medicare HMO | Source: Ambulatory Visit | Attending: Orthopedic Surgery | Admitting: Orthopedic Surgery

## 2022-11-25 ENCOUNTER — Other Ambulatory Visit: Payer: Self-pay

## 2022-11-25 ENCOUNTER — Encounter (HOSPITAL_COMMUNITY): Payer: Self-pay

## 2022-11-25 VITALS — BP 149/87 | HR 71 | Temp 97.7°F | Resp 16 | Ht 63.0 in | Wt 156.4 lb

## 2022-11-25 DIAGNOSIS — I10 Essential (primary) hypertension: Secondary | ICD-10-CM | POA: Insufficient documentation

## 2022-11-25 DIAGNOSIS — E119 Type 2 diabetes mellitus without complications: Secondary | ICD-10-CM | POA: Diagnosis not present

## 2022-11-25 DIAGNOSIS — Z01818 Encounter for other preprocedural examination: Secondary | ICD-10-CM | POA: Diagnosis not present

## 2022-11-25 LAB — BASIC METABOLIC PANEL
Anion gap: 9 (ref 5–15)
BUN: 8 mg/dL (ref 8–23)
CO2: 22 mmol/L (ref 22–32)
Calcium: 8.8 mg/dL — ABNORMAL LOW (ref 8.9–10.3)
Chloride: 110 mmol/L (ref 98–111)
Creatinine, Ser: 0.6 mg/dL (ref 0.44–1.00)
GFR, Estimated: >60 mL/min (ref >60)
Glucose, Bld: 109 mg/dL — ABNORMAL HIGH (ref 70–99)
Potassium: 2.8 mmol/L — ABNORMAL LOW (ref 3.5–5.1)
Sodium: 141 mmol/L (ref 135–145)

## 2022-11-25 LAB — CBC
HCT: 35.2 % — ABNORMAL LOW (ref 36.0–46.0)
Hemoglobin: 11.4 g/dL — ABNORMAL LOW (ref 12.0–15.0)
MCH: 29.6 pg (ref 26.0–34.0)
MCHC: 32.4 g/dL (ref 30.0–36.0)
MCV: 91.4 fL (ref 80.0–100.0)
Platelets: 279 10*3/uL (ref 150–400)
RBC: 3.85 MIL/uL — ABNORMAL LOW (ref 3.87–5.11)
RDW: 12.9 % (ref 11.5–15.5)
WBC: 8 10*3/uL (ref 4.0–10.5)
nRBC: 0 % (ref 0.0–0.2)

## 2022-11-25 LAB — GLUCOSE, CAPILLARY: Glucose-Capillary: 108 mg/dL — ABNORMAL HIGH (ref 70–99)

## 2022-11-25 LAB — SURGICAL PCR SCREEN
MRSA, PCR: NEGATIVE
Staphylococcus aureus: NEGATIVE

## 2022-11-25 LAB — HEMOGLOBIN A1C
Hgb A1c MFr Bld: 7.1 % — ABNORMAL HIGH (ref 4.8–5.6)
Mean Plasma Glucose: 157.07 mg/dL

## 2022-11-25 NOTE — Progress Notes (Signed)
Left message for Claiborne Billings at Dr. Debroah Loop office regarding conflicting sites on surgical posting sheet and orders.  Posting sheet says right knee and orders say left, patient states that she is having the right knee operated on.

## 2022-11-28 DIAGNOSIS — I10 Essential (primary) hypertension: Secondary | ICD-10-CM | POA: Diagnosis not present

## 2022-12-05 NOTE — Anesthesia Preprocedure Evaluation (Signed)
Anesthesia Evaluation  Patient identified by MRN, date of birth, ID band Patient awake    Reviewed: Allergy & Precautions, NPO status , Patient's Chart, lab work & pertinent test results, reviewed documented beta blocker date and time   History of Anesthesia Complications (+) PONV and history of anesthetic complications  Airway Mallampati: II  TM Distance: >3 FB Neck ROM: Full    Dental  (+) Edentulous Lower, Edentulous Upper   Pulmonary neg pulmonary ROS   Pulmonary exam normal        Cardiovascular hypertension, Pt. on medications and Pt. on home beta blockers Normal cardiovascular exam     Neuro/Psych negative neurological ROS     GI/Hepatic negative GI ROS, Neg liver ROS,,,  Endo/Other  diabetes (on dulaglutide, last dose 2 weeks ago), Type 2, Oral Hypoglycemic Agents    Renal/GU negative Renal ROS  negative genitourinary   Musculoskeletal  (+) Arthritis ,    Abdominal   Peds  Hematology negative hematology ROS (+)   Anesthesia Other Findings Day of surgery medications reviewed with patient.  Reproductive/Obstetrics negative OB ROS                             Anesthesia Physical Anesthesia Plan  ASA: 2  Anesthesia Plan: Spinal   Post-op Pain Management: Regional block* and Tylenol PO (pre-op)*   Induction:   PONV Risk Score and Plan: 4 or greater and Treatment may vary due to age or medical condition, Ondansetron, Propofol infusion, Dexamethasone and Midazolam  Airway Management Planned: Natural Airway and Simple Face Mask  Additional Equipment: None  Intra-op Plan:   Post-operative Plan:   Informed Consent: I have reviewed the patients History and Physical, chart, labs and discussed the procedure including the risks, benefits and alternatives for the proposed anesthesia with the patient or authorized representative who has indicated his/her understanding and acceptance.        Plan Discussed with: CRNA  Anesthesia Plan Comments:        Anesthesia Quick Evaluation

## 2022-12-06 ENCOUNTER — Other Ambulatory Visit: Payer: Self-pay

## 2022-12-06 ENCOUNTER — Inpatient Hospital Stay (HOSPITAL_COMMUNITY): Payer: Medicare HMO | Admitting: Certified Registered"

## 2022-12-06 ENCOUNTER — Inpatient Hospital Stay (HOSPITAL_COMMUNITY)
Admission: RE | Admit: 2022-12-06 | Discharge: 2022-12-08 | DRG: 468 | Disposition: A | Discharge status: Home Health | Payer: Medicare HMO | Attending: Orthopedic Surgery | Admitting: Orthopedic Surgery

## 2022-12-06 ENCOUNTER — Inpatient Hospital Stay (HOSPITAL_COMMUNITY): Payer: Medicare HMO | Admitting: Physician Assistant

## 2022-12-06 ENCOUNTER — Encounter (HOSPITAL_COMMUNITY)
Admission: RE | Disposition: A | Discharge status: Home Health | Payer: Self-pay | Source: Home / Self Care | Attending: Orthopedic Surgery

## 2022-12-06 ENCOUNTER — Encounter (HOSPITAL_COMMUNITY): Payer: Self-pay | Admitting: Orthopedic Surgery

## 2022-12-06 ENCOUNTER — Inpatient Hospital Stay (HOSPITAL_COMMUNITY): Payer: Medicare HMO

## 2022-12-06 DIAGNOSIS — Z96651 Presence of right artificial knee joint: Secondary | ICD-10-CM | POA: Diagnosis not present

## 2022-12-06 DIAGNOSIS — Z471 Aftercare following joint replacement surgery: Secondary | ICD-10-CM | POA: Diagnosis not present

## 2022-12-06 DIAGNOSIS — Z7985 Long-term (current) use of injectable non-insulin antidiabetic drugs: Secondary | ICD-10-CM | POA: Diagnosis not present

## 2022-12-06 DIAGNOSIS — Z79899 Other long term (current) drug therapy: Secondary | ICD-10-CM

## 2022-12-06 DIAGNOSIS — Z8249 Family history of ischemic heart disease and other diseases of the circulatory system: Secondary | ICD-10-CM | POA: Diagnosis not present

## 2022-12-06 DIAGNOSIS — M25561 Pain in right knee: Secondary | ICD-10-CM | POA: Diagnosis not present

## 2022-12-06 DIAGNOSIS — M1711 Unilateral primary osteoarthritis, right knee: Secondary | ICD-10-CM | POA: Diagnosis not present

## 2022-12-06 DIAGNOSIS — T84032A Mechanical loosening of internal right knee prosthetic joint, initial encounter: Secondary | ICD-10-CM | POA: Diagnosis not present

## 2022-12-06 DIAGNOSIS — Y792 Prosthetic and other implants, materials and accessory orthopedic devices associated with adverse incidents: Secondary | ICD-10-CM | POA: Diagnosis present

## 2022-12-06 DIAGNOSIS — I10 Essential (primary) hypertension: Secondary | ICD-10-CM | POA: Diagnosis present

## 2022-12-06 DIAGNOSIS — Z96652 Presence of left artificial knee joint: Secondary | ICD-10-CM | POA: Diagnosis not present

## 2022-12-06 DIAGNOSIS — G8918 Other acute postprocedural pain: Secondary | ICD-10-CM | POA: Diagnosis not present

## 2022-12-06 DIAGNOSIS — E119 Type 2 diabetes mellitus without complications: Secondary | ICD-10-CM | POA: Diagnosis present

## 2022-12-06 HISTORY — PX: CONVERSION TO TOTAL KNEE: SHX5785

## 2022-12-06 LAB — BASIC METABOLIC PANEL
Anion gap: 10 (ref 5–15)
BUN: 10 mg/dL (ref 8–23)
CO2: 25 mmol/L (ref 22–32)
Calcium: 8.9 mg/dL (ref 8.9–10.3)
Chloride: 105 mmol/L (ref 98–111)
Creatinine, Ser: 0.57 mg/dL (ref 0.44–1.00)
GFR, Estimated: >60 mL/min (ref >60)
Glucose, Bld: 136 mg/dL — ABNORMAL HIGH (ref 70–99)
Potassium: 3.2 mmol/L — ABNORMAL LOW (ref 3.5–5.1)
Sodium: 140 mmol/L (ref 135–145)

## 2022-12-06 LAB — GLUCOSE, CAPILLARY
Glucose-Capillary: 143 mg/dL — ABNORMAL HIGH (ref 70–99)
Glucose-Capillary: 165 mg/dL — ABNORMAL HIGH (ref 70–99)

## 2022-12-06 SURGERY — CONVERSION, ARTHROPLASTY, KNEE, PARTIAL, TO TOTAL KNEE ARTHROPLASTY
Anesthesia: Spinal | Site: Knee | Laterality: Right

## 2022-12-06 MED ORDER — PROPOFOL 500 MG/50ML IV EMUL
INTRAVENOUS | Status: DC | PRN
  Administered 2022-12-06: 50 ug/kg/min via INTRAVENOUS

## 2022-12-06 MED ORDER — OXYCODONE HCL 5 MG/5ML PO SOLN: 5.0000 mg | Freq: Once | ORAL | Status: DC | PRN

## 2022-12-06 MED ORDER — CHLORHEXIDINE GLUCONATE 0.12 % MT SOLN
15.0000 mL | Freq: Once | OROMUCOSAL | Status: AC
  Administered 2022-12-06: 15 mL via OROMUCOSAL

## 2022-12-06 MED ORDER — DEXAMETHASONE SODIUM PHOSPHATE 10 MG/ML IJ SOLN
INTRAMUSCULAR | Status: AC
  Filled 2022-12-06: qty 1

## 2022-12-06 MED ORDER — POLYETHYLENE GLYCOL 3350 17 G PO PACK: 17.0000 g | PACK | Freq: Every day | ORAL | Status: DC | PRN

## 2022-12-06 MED ORDER — EPHEDRINE SULFATE-NACL 50-0.9 MG/10ML-% IV SOSY
PREFILLED_SYRINGE | INTRAVENOUS | Status: DC | PRN
  Administered 2022-12-06 (×4): 5 mg via INTRAVENOUS

## 2022-12-06 MED ORDER — DIPHENHYDRAMINE HCL 12.5 MG/5ML PO ELIX: 12.5000 mg | ORAL_SOLUTION | ORAL | Status: DC | PRN

## 2022-12-06 MED ORDER — WATER FOR IRRIGATION, STERILE IR SOLN
Status: DC | PRN
  Administered 2022-12-06 (×2): 1000 mL

## 2022-12-06 MED ORDER — LIDOCAINE 2% (20 MG/ML) 5 ML SYRINGE
INTRAMUSCULAR | Status: DC | PRN
  Administered 2022-12-06: 40 mg via INTRAVENOUS

## 2022-12-06 MED ORDER — ONDANSETRON HCL 4 MG PO TABS: 4.0000 mg | ORAL_TABLET | Freq: Four times a day (QID) | ORAL | Status: DC | PRN

## 2022-12-06 MED ORDER — IRBESARTAN 150 MG PO TABS
150.0000 mg | ORAL_TABLET | Freq: Every day | ORAL | Status: DC
  Administered 2022-12-06 – 2022-12-08 (×3): 150 mg via ORAL
  Filled 2022-12-06 (×3): qty 1

## 2022-12-06 MED ORDER — FENTANYL CITRATE PF 50 MCG/ML IJ SOSY
50.0000 ug | PREFILLED_SYRINGE | INTRAMUSCULAR | Status: DC
  Administered 2022-12-06: 50 ug via INTRAVENOUS
  Filled 2022-12-06: qty 2

## 2022-12-06 MED ORDER — FENTANYL CITRATE PF 50 MCG/ML IJ SOSY: 25.0000 ug | PREFILLED_SYRINGE | INTRAMUSCULAR | Status: DC | PRN

## 2022-12-06 MED ORDER — FENTANYL CITRATE (PF) 100 MCG/2ML IJ SOLN
INTRAMUSCULAR | Status: AC
  Filled 2022-12-06: qty 2

## 2022-12-06 MED ORDER — METOCLOPRAMIDE HCL 5 MG PO TABS: 5.0000 mg | ORAL_TABLET | Freq: Three times a day (TID) | ORAL | Status: DC | PRN

## 2022-12-06 MED ORDER — 0.9 % SODIUM CHLORIDE (POUR BTL) OPTIME
TOPICAL | Status: DC | PRN
  Administered 2022-12-06: 1000 mL

## 2022-12-06 MED ORDER — PROPOFOL 10 MG/ML IV BOLUS
INTRAVENOUS | Status: AC
  Filled 2022-12-06: qty 20

## 2022-12-06 MED ORDER — OXYCODONE HCL 5 MG PO TABS: 5.0000 mg | ORAL_TABLET | Freq: Once | ORAL | Status: DC | PRN

## 2022-12-06 MED ORDER — MIDAZOLAM HCL 2 MG/2ML IJ SOLN
INTRAMUSCULAR | Status: AC
  Filled 2022-12-06: qty 2

## 2022-12-06 MED ORDER — DEXAMETHASONE SODIUM PHOSPHATE 10 MG/ML IJ SOLN
8.0000 mg | Freq: Once | INTRAMUSCULAR | Status: AC
  Administered 2022-12-06: 8 mg via INTRAVENOUS

## 2022-12-06 MED ORDER — MENTHOL 3 MG MT LOZG: 1.0000 | LOZENGE | OROMUCOSAL | Status: DC | PRN

## 2022-12-06 MED ORDER — SODIUM CHLORIDE (PF) 0.9 % IJ SOLN
INTRAMUSCULAR | Status: AC
  Filled 2022-12-06: qty 50

## 2022-12-06 MED ORDER — PROPOFOL 1000 MG/100ML IV EMUL
INTRAVENOUS | Status: AC
  Filled 2022-12-06: qty 100

## 2022-12-06 MED ORDER — ACETAMINOPHEN 325 MG PO TABS: 325.0000 mg | ORAL_TABLET | Freq: Four times a day (QID) | ORAL | Status: DC | PRN

## 2022-12-06 MED ORDER — CEFAZOLIN SODIUM-DEXTROSE 1-4 GM/50ML-% IV SOLN
1.0000 g | Freq: Four times a day (QID) | INTRAVENOUS | Status: AC
  Administered 2022-12-06 (×2): 1 g via INTRAVENOUS
  Filled 2022-12-06 (×2): qty 50

## 2022-12-06 MED ORDER — ALUM & MAG HYDROXIDE-SIMETH 200-200-20 MG/5ML PO SUSP: 30.0000 mL | ORAL | Status: DC | PRN

## 2022-12-06 MED ORDER — ASPIRIN 81 MG PO CHEW
81.0000 mg | CHEWABLE_TABLET | Freq: Two times a day (BID) | ORAL | Status: DC
  Administered 2022-12-06 – 2022-12-08 (×4): 81 mg via ORAL
  Filled 2022-12-06 (×4): qty 1

## 2022-12-06 MED ORDER — POVIDONE-IODINE 10 % EX SWAB
2.0000 | Freq: Once | CUTANEOUS | Status: AC
  Administered 2022-12-06: 2 via TOPICAL

## 2022-12-06 MED ORDER — OXYCODONE HCL 5 MG PO TABS
5.0000 mg | ORAL_TABLET | ORAL | Status: DC | PRN
  Administered 2022-12-06: 5 mg via ORAL
  Administered 2022-12-07 (×3): 10 mg via ORAL
  Filled 2022-12-06: qty 2
  Filled 2022-12-06: qty 1
  Filled 2022-12-06 (×2): qty 2

## 2022-12-06 MED ORDER — EPHEDRINE 5 MG/ML INJ
INTRAVENOUS | Status: AC
  Filled 2022-12-06: qty 5

## 2022-12-06 MED ORDER — METFORMIN HCL ER 500 MG PO TB24
500.0000 mg | ORAL_TABLET | Freq: Every day | ORAL | Status: DC
  Filled 2022-12-06: qty 1

## 2022-12-06 MED ORDER — METHOCARBAMOL 500 MG IVPB - SIMPLE MED
500.0000 mg | Freq: Four times a day (QID) | INTRAVENOUS | Status: DC | PRN
  Administered 2022-12-06: 500 mg via INTRAVENOUS

## 2022-12-06 MED ORDER — CEFAZOLIN SODIUM-DEXTROSE 2-4 GM/100ML-% IV SOLN
2.0000 g | INTRAVENOUS | Status: AC
  Administered 2022-12-06: 2 g via INTRAVENOUS
  Filled 2022-12-06: qty 100

## 2022-12-06 MED ORDER — EPINEPHRINE PF 1 MG/ML IJ SOLN
INTRAMUSCULAR | Status: AC
  Filled 2022-12-06: qty 1

## 2022-12-06 MED ORDER — TRAMADOL HCL 50 MG PO TABS
50.0000 mg | ORAL_TABLET | Freq: Four times a day (QID) | ORAL | Status: DC
  Administered 2022-12-06 – 2022-12-08 (×8): 50 mg via ORAL
  Filled 2022-12-06 (×8): qty 1

## 2022-12-06 MED ORDER — PROPOFOL 10 MG/ML IV BOLUS
INTRAVENOUS | Status: DC | PRN
  Administered 2022-12-06 (×3): 10 mg via INTRAVENOUS

## 2022-12-06 MED ORDER — ONDANSETRON HCL 4 MG/2ML IJ SOLN: 4.0000 mg | Freq: Four times a day (QID) | INTRAMUSCULAR | Status: DC | PRN

## 2022-12-06 MED ORDER — BUPIVACAINE LIPOSOME 1.3 % IJ SUSP
INTRAMUSCULAR | Status: DC | PRN
  Administered 2022-12-06: 20 mL

## 2022-12-06 MED ORDER — AMLODIPINE BESYLATE 5 MG PO TABS
5.0000 mg | ORAL_TABLET | Freq: Every day | ORAL | Status: DC
  Administered 2022-12-07 – 2022-12-08 (×2): 5 mg via ORAL
  Filled 2022-12-06 (×2): qty 1

## 2022-12-06 MED ORDER — BUPIVACAINE LIPOSOME 1.3 % IJ SUSP: 20.0000 mL | Freq: Once | INTRAMUSCULAR | Status: DC

## 2022-12-06 MED ORDER — BUPIVACAINE HCL (PF) 0.25 % IJ SOLN
INTRAMUSCULAR | Status: DC | PRN
  Administered 2022-12-06: 30 mL

## 2022-12-06 MED ORDER — METHOCARBAMOL 500 MG IVPB - SIMPLE MED
INTRAVENOUS | Status: AC
  Filled 2022-12-06: qty 55

## 2022-12-06 MED ORDER — ORAL CARE MOUTH RINSE: 15.0000 mL | Freq: Once | OROMUCOSAL | Status: AC

## 2022-12-06 MED ORDER — EPINEPHRINE PF 1 MG/ML IJ SOLN
INTRAMUSCULAR | Status: DC | PRN
  Administered 2022-12-06: .15 mL

## 2022-12-06 MED ORDER — BUPIVACAINE HCL 0.25 % IJ SOLN
INTRAMUSCULAR | Status: AC
  Filled 2022-12-06: qty 1

## 2022-12-06 MED ORDER — BUPIVACAINE-EPINEPHRINE (PF) 0.5% -1:200000 IJ SOLN
INTRAMUSCULAR | Status: DC | PRN
  Administered 2022-12-06: 15 mL via PERINEURAL

## 2022-12-06 MED ORDER — DEXAMETHASONE SODIUM PHOSPHATE 10 MG/ML IJ SOLN
10.0000 mg | Freq: Once | INTRAMUSCULAR | Status: AC
  Administered 2022-12-07: 10 mg via INTRAVENOUS
  Filled 2022-12-06: qty 1

## 2022-12-06 MED ORDER — MAGNESIUM CITRATE PO SOLN: 1.0000 | Freq: Once | ORAL | Status: DC | PRN

## 2022-12-06 MED ORDER — BUPIVACAINE LIPOSOME 1.3 % IJ SUSP
INTRAMUSCULAR | Status: AC
  Filled 2022-12-06: qty 20

## 2022-12-06 MED ORDER — BISACODYL 10 MG RE SUPP: 10.0000 mg | Freq: Every day | RECTAL | Status: DC | PRN

## 2022-12-06 MED ORDER — LACTATED RINGERS IV SOLN: INTRAVENOUS | Status: DC

## 2022-12-06 MED ORDER — ACETAMINOPHEN 500 MG PO TABS
1000.0000 mg | ORAL_TABLET | Freq: Four times a day (QID) | ORAL | Status: AC
  Administered 2022-12-06 – 2022-12-07 (×4): 1000 mg via ORAL
  Filled 2022-12-06 (×4): qty 2

## 2022-12-06 MED ORDER — PHENOL 1.4 % MT LIQD: 1.0000 | OROMUCOSAL | Status: DC | PRN

## 2022-12-06 MED ORDER — SODIUM CHLORIDE 0.9 % IR SOLN
Status: DC | PRN
  Administered 2022-12-06: 3000 mL

## 2022-12-06 MED ORDER — MIDAZOLAM HCL 2 MG/2ML IJ SOLN
INTRAMUSCULAR | Status: DC | PRN
  Administered 2022-12-06 (×2): 1 mg via INTRAVENOUS

## 2022-12-06 MED ORDER — ONDANSETRON HCL 4 MG/2ML IJ SOLN
INTRAMUSCULAR | Status: DC | PRN
  Administered 2022-12-06: 4 mg via INTRAVENOUS

## 2022-12-06 MED ORDER — METOCLOPRAMIDE HCL 5 MG/ML IJ SOLN: 5.0000 mg | Freq: Three times a day (TID) | INTRAMUSCULAR | Status: DC | PRN

## 2022-12-06 MED ORDER — TRANEXAMIC ACID-NACL 1000-0.7 MG/100ML-% IV SOLN
1000.0000 mg | INTRAVENOUS | Status: AC
  Administered 2022-12-06: 1000 mg via INTRAVENOUS
  Filled 2022-12-06: qty 100

## 2022-12-06 MED ORDER — ACETAMINOPHEN 500 MG PO TABS
1000.0000 mg | ORAL_TABLET | Freq: Once | ORAL | Status: AC
  Administered 2022-12-06: 1000 mg via ORAL

## 2022-12-06 MED ORDER — PHENYLEPHRINE HCL-NACL 20-0.9 MG/250ML-% IV SOLN
INTRAVENOUS | Status: DC | PRN
  Administered 2022-12-06: 20 ug/min via INTRAVENOUS

## 2022-12-06 MED ORDER — TRANEXAMIC ACID-NACL 1000-0.7 MG/100ML-% IV SOLN
1000.0000 mg | Freq: Once | INTRAVENOUS | Status: AC
  Administered 2022-12-06: 1000 mg via INTRAVENOUS
  Filled 2022-12-06: qty 100

## 2022-12-06 MED ORDER — FENTANYL CITRATE (PF) 100 MCG/2ML IJ SOLN
INTRAMUSCULAR | Status: DC | PRN
  Administered 2022-12-06: 25 ug via INTRAVENOUS

## 2022-12-06 MED ORDER — LIDOCAINE HCL (PF) 2 % IJ SOLN
INTRAMUSCULAR | Status: AC
  Filled 2022-12-06: qty 5

## 2022-12-06 MED ORDER — ATORVASTATIN CALCIUM 20 MG PO TABS
20.0000 mg | ORAL_TABLET | Freq: Every day | ORAL | Status: DC
  Administered 2022-12-06 – 2022-12-07 (×2): 20 mg via ORAL
  Filled 2022-12-06 (×2): qty 1

## 2022-12-06 MED ORDER — SODIUM CHLORIDE 0.9% FLUSH
INTRAVENOUS | Status: DC | PRN
  Administered 2022-12-06: 50 mL

## 2022-12-06 MED ORDER — DOCUSATE SODIUM 100 MG PO CAPS
100.0000 mg | ORAL_CAPSULE | Freq: Two times a day (BID) | ORAL | Status: DC
  Administered 2022-12-06 – 2022-12-08 (×4): 100 mg via ORAL
  Filled 2022-12-06 (×4): qty 1

## 2022-12-06 MED ORDER — ONDANSETRON HCL 4 MG/2ML IJ SOLN
INTRAMUSCULAR | Status: AC
  Filled 2022-12-06: qty 2

## 2022-12-06 MED ORDER — PANTOPRAZOLE SODIUM 40 MG PO TBEC
40.0000 mg | DELAYED_RELEASE_TABLET | Freq: Every day | ORAL | Status: DC
  Administered 2022-12-06 – 2022-12-08 (×3): 40 mg via ORAL
  Filled 2022-12-06 (×3): qty 1

## 2022-12-06 MED ORDER — GABAPENTIN 300 MG PO CAPS
300.0000 mg | ORAL_CAPSULE | Freq: Every evening | ORAL | Status: DC | PRN
  Administered 2022-12-07: 300 mg via ORAL
  Filled 2022-12-06: qty 1

## 2022-12-06 MED ORDER — METHOCARBAMOL 500 MG PO TABS
500.0000 mg | ORAL_TABLET | Freq: Four times a day (QID) | ORAL | Status: DC | PRN
  Administered 2022-12-06 – 2022-12-07 (×3): 500 mg via ORAL
  Filled 2022-12-06 (×3): qty 1

## 2022-12-06 MED ORDER — HYDROMORPHONE HCL 1 MG/ML IJ SOLN: 0.5000 mg | INTRAMUSCULAR | Status: DC | PRN

## 2022-12-06 MED ORDER — BUPIVACAINE IN DEXTROSE 0.75-8.25 % IT SOLN
INTRATHECAL | Status: DC | PRN
  Administered 2022-12-06: 1.6 mL via INTRATHECAL

## 2022-12-06 MED ORDER — MIDAZOLAM HCL 2 MG/2ML IJ SOLN
2.0000 mg | Freq: Once | INTRAMUSCULAR | Status: AC
  Administered 2022-12-06: 1 mg via INTRAVENOUS
  Filled 2022-12-06: qty 2

## 2022-12-06 MED ORDER — CARVEDILOL 12.5 MG PO TABS
12.5000 mg | ORAL_TABLET | Freq: Two times a day (BID) | ORAL | Status: DC
  Administered 2022-12-06 – 2022-12-08 (×4): 12.5 mg via ORAL
  Filled 2022-12-06 (×4): qty 1

## 2022-12-06 MED ORDER — ACETAMINOPHEN 500 MG PO TABS
1000.0000 mg | ORAL_TABLET | Freq: Once | ORAL | Status: DC
  Filled 2022-12-06: qty 2

## 2022-12-06 MED ORDER — POVIDONE-IODINE 10 % EX SWAB: 2.0000 | Freq: Once | CUTANEOUS | Status: DC

## 2022-12-06 MED ORDER — OXYCODONE HCL 5 MG PO TABS
10.0000 mg | ORAL_TABLET | ORAL | Status: DC | PRN
  Administered 2022-12-08: 15 mg via ORAL
  Filled 2022-12-06: qty 3

## 2022-12-06 SURGICAL SUPPLY — 59 items
BAG COUNTER SPONGE SURGICOUNT (BAG) IMPLANT
BAG SPNG CNTER NS LX DISP (BAG) ×1
BLADE FLEX CHISEL 8 2.5 (MISCELLANEOUS) IMPLANT
BLADE FLEX CHISEL 8 2.6 (MISCELLANEOUS) IMPLANT
BLADE HEX COATED 2.75 (ELECTRODE) ×2 IMPLANT
BLADE SAG 18X100X1.27 (BLADE) ×2 IMPLANT
BLADE SAGITTAL 25.0X1.37X90 (BLADE) ×2 IMPLANT
BLADE SURG 15 STRL LF DISP TIS (BLADE) ×2 IMPLANT
BLADE SURG 15 STRL SS (BLADE) ×1
BLADE SURG SZ10 CARB STEEL (BLADE) IMPLANT
BNDG CMPR MED 10X6 ELC LF (GAUZE/BANDAGES/DRESSINGS) ×1
BNDG ELASTIC 6X10 VLCR STRL LF (GAUZE/BANDAGES/DRESSINGS) ×2 IMPLANT
BOWL SMART MIX CTS (DISPOSABLE) IMPLANT
BSPLAT TIB 4 KN TRITANIUM (Knees) ×1 IMPLANT
CEMENT BONE SIMPLEX SPEEDSET (Cement) IMPLANT
CLSR STERI-STRIP ANTIMIC 1/2X4 (GAUZE/BANDAGES/DRESSINGS) ×2 IMPLANT
COMPONENT TRI CR RETAIN KNEE (Orthopedic Implant) IMPLANT
COVER SURGICAL LIGHT HANDLE (MISCELLANEOUS) ×2 IMPLANT
CUFF TOURN SGL QUICK 34 (TOURNIQUET CUFF) ×1
CUFF TRNQT CYL 34X4.125X (TOURNIQUET CUFF) ×2 IMPLANT
DRAPE U-SHAPE 47X51 STRL (DRAPES) ×2 IMPLANT
DRSG MEPILEX POST OP 4X12 (GAUZE/BANDAGES/DRESSINGS) ×2 IMPLANT
DRSG MEPILEX SACRM 8.7X9.8 (GAUZE/BANDAGES/DRESSINGS) IMPLANT
DURAPREP 26ML APPLICATOR (WOUND CARE) ×4 IMPLANT
GLOVE BIO SURGEON STRL SZ7.5 (GLOVE) ×4 IMPLANT
GLOVE BIOGEL PI IND STRL 7.5 (GLOVE) ×2 IMPLANT
GLOVE BIOGEL PI IND STRL 8 (GLOVE) ×2 IMPLANT
GLOVE SURG SYN 7.5  E (GLOVE) ×1
GLOVE SURG SYN 7.5 E (GLOVE) ×1 IMPLANT
GLOVE SURG SYN 7.5 PF PI (GLOVE) ×2 IMPLANT
GOWN STRL REUS W/ TWL LRG LVL3 (GOWN DISPOSABLE) ×2 IMPLANT
GOWN STRL REUS W/ TWL XL LVL3 (GOWN DISPOSABLE) ×2 IMPLANT
GOWN STRL REUS W/TWL LRG LVL3 (GOWN DISPOSABLE) ×1
GOWN STRL REUS W/TWL XL LVL3 (GOWN DISPOSABLE) ×1
HANDPIECE INTERPULSE COAX TIP (DISPOSABLE) ×1
HOLDER FOLEY CATH W/STRAP (MISCELLANEOUS) IMPLANT
IMMOBILIZER KNEE 20 (SOFTGOODS) ×1
IMMOBILIZER KNEE 20 THIGH 36 (SOFTGOODS) IMPLANT
IMMOBILIZER KNEE 22 UNIV (SOFTGOODS) IMPLANT
INSERT CS TRIATH SZ4 13 (Insert) IMPLANT
KNEE PATELLA ASYMMETRIC 9X29 (Knees) IMPLANT
KNEE TIBIAL COMP TRI SZ4 (Knees) IMPLANT
MANIFOLD NEPTUNE II (INSTRUMENTS) ×2 IMPLANT
NS IRRIG 1000ML POUR BTL (IV SOLUTION) ×2 IMPLANT
PACK TOTAL KNEE CUSTOM (KITS) ×2 IMPLANT
PIN FLUTED HEDLESS FIX 3.5X1/8 (PIN) IMPLANT
PROTECTOR NERVE ULNAR (MISCELLANEOUS) ×2 IMPLANT
SET HNDPC FAN SPRY TIP SCT (DISPOSABLE) ×2 IMPLANT
SPIKE FLUID TRANSFER (MISCELLANEOUS) ×2 IMPLANT
SUT MNCRL AB 3-0 PS2 18 (SUTURE) ×2 IMPLANT
SUT VIC AB 0 CT1 36 (SUTURE) ×2 IMPLANT
SUT VIC AB 1 CT1 36 (SUTURE) ×4 IMPLANT
SUT VIC AB 2-0 CT1 27 (SUTURE) ×1
SUT VIC AB 2-0 CT1 TAPERPNT 27 (SUTURE) ×2 IMPLANT
TRAY FOLEY MTR SLVR 14FR STAT (SET/KITS/TRAYS/PACK) IMPLANT
TRAY FOLEY MTR SLVR 16FR STAT (SET/KITS/TRAYS/PACK) IMPLANT
TRIA CRUCIATE RETAIN KNEE (Orthopedic Implant) ×1 IMPLANT
TUBE SUCTION HIGH CAP CLEAR NV (SUCTIONS) ×2 IMPLANT
WRAP KNEE MAXI GEL POST OP (GAUZE/BANDAGES/DRESSINGS) ×2 IMPLANT

## 2022-12-06 NOTE — Progress Notes (Signed)
Orthopedic Tech Progress Note Patient Details:  Lori Bennett 02/28/1956 [id number]  Patient ID: Lori Bennett, female   DOB: 04/12/1956, 67 y.o.   MRN: [id number]  [name] 12/06/2022, 1:01 PM Cpm applied in pacu. Bone foam placed on bed in pacu

## 2022-12-06 NOTE — Plan of Care (Signed)
  Problem: Education: Goal: Knowledge of the prescribed therapeutic regimen will improve Outcome: Progressing   Problem: Activity: Goal: Ability to avoid complications of mobility impairment will improve Outcome: Progressing   Problem: Pain Management: Goal: Pain level will decrease with appropriate interventions Outcome: Progressing   Problem: Education: Goal: Knowledge of General Education information will improve Description: Including pain rating scale, medication(s)/side effects and non-pharmacologic comfort measures Outcome: Progressing   Problem: Activity: Goal: Risk for activity intolerance will decrease Outcome: Progressing   Problem: Elimination: Goal: Will not experience complications related to bowel motility Outcome: Progressing   Problem: Pain Managment: Goal: General experience of comfort will improve Outcome: Progressing   

## 2022-12-06 NOTE — Interval H&P Note (Signed)
History and Physical Interval Note:  12/06/2022 8:42 AM  Lori Bennett  has presented today for surgery, with the diagnosis of RIGHT UNICOMPARTMENTAL KNEE JOINT PROSTESIS LOOSENING.  The various methods of treatment have been discussed with the patient and family. After consideration of risks, benefits and other options for treatment, the patient has consented to  Procedure(s): CONVERSION TO TOTAL KNEE (Right) as a surgical intervention.  The patient's history has been reviewed, patient examined, no change in status, stable for surgery.  I have reviewed the patient's chart and labs.  Questions were answered to the patient's satisfaction.     Renette Butters

## 2022-12-06 NOTE — Anesthesia Procedure Notes (Signed)
Anesthesia Regional Block: Adductor canal block   Pre-Anesthetic Checklist: , timeout performed,  Correct Patient, Correct Site, Correct Laterality,  Correct Procedure, Correct Position, site marked,  Risks and benefits discussed,  Pre-op evaluation,  At surgeon's request and post-op pain management  Laterality: Right  Prep: Maximum Sterile Barrier Precautions used, chloraprep       Needles:  Injection technique: Single-shot  Needle Type: Echogenic Stimulator Needle     Needle Length: 9cm  Needle Gauge: 22     Additional Needles:   Procedures:,,,, ultrasound used (permanent image in chart),,    Narrative:  Start time: 12/06/2022 9:13 AM End time: 12/06/2022 9:16 AM Injection made incrementally with aspirations every 5 mL.  Performed by: Personally  Anesthesiologist: Brennan Bailey, MD  Additional Notes: Risks, benefits, and alternative discussed. Patient gave consent for procedure. Patient prepped and draped in sterile fashion. Sedation administered, patient remains easily responsive to voice. Relevant anatomy identified with ultrasound guidance. Local anesthetic given in 5cc increments with no signs or symptoms of intravascular injection. No pain or paraesthesias with injection. Patient monitored throughout procedure with signs of LAST or immediate complications. Tolerated well. Ultrasound image placed in chart.  Tawny Asal, MD

## 2022-12-06 NOTE — Evaluation (Signed)
Physical Therapy Evaluation Patient Details Name: Lori Bennett MRN: [id number] DOB: [birth year] Today's Date: 12/06/2022  History of Present Illness  67 y.o. female admitted for R UKA to TKA conversion 12/06/22; PMH: L TKA 2020  Clinical Impression  Pt is s/p TKA resulting in the deficits listed below (see PT Problem List). Mod assist for supine to sit. Pt quite lethargic sitting edge of bed for ~10 minutes, eyes closed most of session but able to answer orientation questions appropriately. Vital signs stable. Did not attempt to stand 2* lethargy and numbness in RLE. Good progress expected once spinal wears off and pt is more alert. Plan is to DC to her sister's home.  Pt will benefit from skilled PT to increase their independence and safety with mobility to allow discharge to the venue listed below.         Recommendations for follow up therapy are one component of a multi-disciplinary discharge planning process, led by the attending physician.  Recommendations may be updated based on patient status, additional functional criteria and insurance authorization.  Follow Up Recommendations Follow physician's recommendations for discharge plan and follow up therapies      Assistance Recommended at Discharge Intermittent Supervision/Assistance  Patient can return home with the following  A little help with walking and/or transfers;A little help with bathing/dressing/bathroom;Assistance with cooking/housework;Assist for transportation;Help with stairs or ramp for entrance    Equipment Recommendations None recommended by PT  Recommendations for Other Services       Functional Status Assessment Patient has had a recent decline in their functional status and demonstrates the ability to make significant improvements in function in a reasonable and predictable amount of time.     Precautions / Restrictions Precautions Precautions: Knee Precaution Comments: reviewed no pillow under  knee Restrictions Weight Bearing Restrictions: No Other Position/Activity Restrictions: WBAT      Mobility  Bed Mobility Overal bed mobility: Needs Assistance Bed Mobility: Supine to Sit, Sit to Supine     Supine to sit: Mod assist Sit to supine: Max assist   General bed mobility comments: assist to raise trunk and pivot hips to edge of bed, pt lethargic but oriented, eyes closed most of session. Sat edge of bed ~10 minutes.    Transfers                   General transfer comment: NT -pt lethargic, RLE numb    Ambulation/Gait                  Stairs            Wheelchair Mobility    Modified Rankin (Stroke Patients Only)       Balance Overall balance assessment: Needs assistance Sitting-balance support: Feet supported Sitting balance-Leahy Scale: Fair                                       Pertinent Vitals/Pain Pain Assessment Pain Assessment: Faces Faces Pain Scale: Hurts even more Pain Location: R knee with movement Pain Descriptors / Indicators: Grimacing Pain Intervention(s): Limited activity within patient's tolerance, Monitored during session, RN gave pain meds during session, Ice applied    Home Living Family/patient expects to be discharged to:: Private residence   Available Help at Discharge: Family;Available 24 hours/day Type of Home: House Home Access: Stairs to enter   [employer] of Steps: 2   Home Layout: One level Home  Equipment: Conservation officer, nature (2 wheels) Additional Comments: plans to DC to sister's home where she'll have 24 hr assist    Prior Function Prior Level of Function : Independent/Modified Independent             Mobility Comments: walked without AD; denies falls in past 6 months ADLs Comments: independent     Hand Dominance        Extremity/Trunk Assessment   Upper Extremity Assessment Upper Extremity Assessment: Overall WFL for tasks assessed    Lower Extremity  Assessment Lower Extremity Assessment: RLE deficits/detail RLE Deficits / Details: no active knee/hip movement, minimal wiggling of toes and ankle DF/PF AROM RLE Sensation: decreased light touch RLE Coordination: decreased fine motor;decreased gross motor    Cervical / Trunk Assessment Cervical / Trunk Assessment: Normal  Communication   Communication: No difficulties  Cognition Arousal/Alertness: Lethargic, Suspect due to medications Behavior During Therapy: Flat affect Overall Cognitive Status: Within Functional Limits for tasks assessed                                 General Comments: very groggy, able to respond correctly to orientation questions, slow to respond, eyes closed most of time        General Comments      Exercises Total Joint Exercises Ankle Circles/Pumps: Both, 10 reps, AROM Heel Slides: PROM, Right, 5 reps, Supine   Assessment/Plan    PT Assessment Patient needs continued PT services  PT Problem List Decreased range of motion;Decreased mobility;Decreased strength;Decreased activity tolerance;Pain       PT Treatment Interventions Gait training;Therapeutic activities;Functional mobility training;Patient/family education    PT Goals (Current goals can be found in the Care Plan section)  Acute Rehab PT Goals Patient Stated Goal: walk PT Goal Formulation: With patient/family Time For Goal Achievement: 12/13/22 Potential to Achieve Goals: Good    Frequency 7X/week     Co-evaluation               AM-PAC PT "6 Clicks" Mobility  Outcome Measure Help needed turning from your back to your side while in a flat bed without using bedrails?: A Lot Help needed moving from lying on your back to sitting on the side of a flat bed without using bedrails?: A Lot Help needed moving to and from a bed to a chair (including a wheelchair)?: Total Help needed standing up from a chair using your arms (e.g., wheelchair or bedside chair)?: Total Help  needed to walk in hospital room?: Total Help needed climbing 3-5 steps with a railing? : Total 6 Click Score: 8    End of Session   Activity Tolerance: Patient limited by lethargy Patient left: in bed;with call bell/phone within reach;with family/visitor present Nurse Communication: Mobility status PT Visit Diagnosis: Difficulty in walking, not elsewhere classified (R26.2);Pain;Other abnormalities of gait and mobility (R26.89);Muscle weakness (generalized) (M62.81) Pain - Right/Left: Right Pain - part of body: Knee    Time: [id number] PT Time Calculation (min) (ACUTE ONLY): 30 min   Charges:   PT Evaluation $PT Eval Moderate Complexity: 1 Mod PT Treatments $Therapeutic Activity: 8-22 mins        Blondell Reveal Kistler PT 12/06/2022  Acute Rehabilitation Services  Office [phone]

## 2022-12-06 NOTE — Progress Notes (Signed)
Attempted report 

## 2022-12-06 NOTE — Discharge Instructions (Signed)

## 2022-12-06 NOTE — Anesthesia Procedure Notes (Signed)
Procedure Name: MAC Date/Time: 12/06/2022 10:06 AM  Performed by: Eben Burow, CRNAPre-anesthesia Checklist: Patient identified, Emergency Drugs available, Suction available, Patient being monitored and Timeout performed Oxygen Delivery Method: Simple face mask Placement Confirmation: positive ETCO2

## 2022-12-06 NOTE — Transfer of Care (Signed)
Immediate Anesthesia Transfer of Care Note  Patient: Toneisha Stranger  Procedure(s) Performed: CONVERSION TO TOTAL KNEE (Right: Knee)  Patient Location: PACU  Anesthesia Type:Spinal  Level of Consciousness: drowsy and patient cooperative  Airway & Oxygen Therapy: Patient Spontanous Breathing and Patient connected to face mask oxygen  Post-op Assessment: Report given to RN and Post -op Vital signs reviewed and stable  Post vital signs: Reviewed and stable  Last Vitals:  Vitals Value Taken Time  BP 123/72 12/06/22 1233  Temp    Pulse 74 12/06/22 1235  Resp 21 12/06/22 1235  SpO2 96 % 12/06/22 1235  Vitals shown include unvalidated device data.  Last Pain:  Vitals:   12/06/22 0921  TempSrc:   PainSc: 0-No pain         Complications: No notable events documented.

## 2022-12-06 NOTE — Op Note (Signed)
DATE OF SURGERY:  12/06/2022 TIME: 11:47 AM  PATIENT NAME:  Lori Bennett   AGE: 67 y.o.    PRE-OPERATIVE DIAGNOSIS:  RIGHT UNICOMPARTMENTAL KNEE JOINT PROSTESIS LOOSENING  POST-OPERATIVE DIAGNOSIS:  Same  PROCEDURE:  Procedure(s): CONVERSION TO TOTAL KNEE   SURGEON:  Renette Butters, MD   ASSISTANT:  Aggie Moats, PA-C, he was present and scrubbed throughout the case, critical for completion in a timely fashion, and for retraction, instrumentation, and closure.   OPERATIVE IMPLANTS: [employer] Triathlon Posterior Stabilized.  Femur size 3, Tibia size 4, Patella size 29 3-peg oval button, with a 13 mm polyethylene insert.   PREOPERATIVE INDICATIONS:  Lori Bennett is a 67 y.o. year old female with end stage bone on bone degenerative arthritis of the knee who failed conservative treatment, including injections, antiinflammatories, activity modification, and assistive devices, and had significant impairment of their activities of daily living, and elected for Total Knee Arthroplasty.   The risks, benefits, and alternatives were discussed at length including but not limited to the risks of infection, bleeding, nerve injury, stiffness, blood clots, the need for revision surgery, cardiopulmonary complications, among others, and they were willing to proceed.   OPERATIVE DESCRIPTION:  The patient was brought to the operative room and placed in a supine position.  General anesthesia was administered.  IV antibiotics were given.  The lower extremity was prepped and draped in the usual sterile fashion.  Time out was performed.  The leg was elevated and exsanguinated and the tourniquet was inflated.  Anterior approach was performed.  The patella was everted and osteophytes were removed.  The anterior horn of the medial and lateral meniscus was removed.   I used a combination of a osteotome and saw to remove her previous implants. These came out with minimal bone loss  The distal femur was  opened with the drill and the intramedullary distal femoral cutting jig was utilized, set at 5 degrees resecting 9 mm off the distal femur.  Care was taken to protect the collateral ligaments.  The distal femoral sizing jig was applied, taking care to avoid notching.  Then the 4-in-1 cutting jig was applied and the anterior and posterior femur was cut, along with the chamfer cuts.  All posterior osteophytes were removed.  The flexion gap was then measured and was symmetric with the extension gap.  Then the extramedullary tibial cutting jig was utilized making the appropriate cut using the anterior tibial crest as a reference building in appropriate posterior slope.  Care was taken during the cut to protect the medial and collateral ligaments.  The proximal tibia was removed along with the posterior horns of the menisci.  The PCL was sacrificed.    The extensor gap was measured and was approximately 24m.    I completed the distal femoral preparation using the appropriate jig to prepare the box.  The patella was then measured, and cut with the saw.    The proximal tibia sized and prepared accordingly with the reamer and the punch, and then all components were trialed with the 150mpoly insert.  The knee was found to have excellent balance and full motion.    The above named components were then implanted using cement on the tibia and the femur lugs. All excess cement was removed. Poly tibial piece and patella were inserted.  I was very happy with his stability and ROM  I performed a periarticular injection with marcaine and toradol  The knee was easily taken through a range  of motion and the patella tracked well and the knee irrigated copiously and the parapatellar and subcutaneous tissue closed with vicryl, and monocryl with steri strips for the skin.  The incision was dressed with sterile gauze and the tourniquet released and the patient was awakened and returned to the PACU in stable and  satisfactory condition.  There were no complications.  Total tourniquet time was 80 minutes.   POSTOPERATIVE PLAN: post op Abx, DVT px: SCD's, TED's, Early ambulation and chemical px

## 2022-12-06 NOTE — Anesthesia Postprocedure Evaluation (Signed)
Anesthesia Post Note  Patient: Lori Bennett  Procedure(s) Performed: CONVERSION TO TOTAL KNEE (Right: Knee)     Patient location during evaluation: PACU Anesthesia Type: Spinal Level of consciousness: awake and alert Pain management: pain level controlled Vital Signs Assessment: post-procedure vital signs reviewed and stable Respiratory status: spontaneous breathing, nonlabored ventilation and respiratory function stable Cardiovascular status: blood pressure returned to baseline Postop Assessment: no apparent nausea or vomiting, spinal receding, no headache and no backache Anesthetic complications: no   No notable events documented.  Last Vitals:  Vitals:   12/06/22 1300 12/06/22 1315  BP: 139/72 (!) 141/74  Pulse: 69 70  Resp: 17 16  Temp:    SpO2: 97% 93%    Last Pain:  Vitals:   12/06/22 1315  TempSrc:   PainSc: 0-No pain                 Marthenia Rolling

## 2022-12-06 NOTE — Anesthesia Procedure Notes (Signed)
Spinal  Patient location during procedure: OR Start time: 12/06/2022 10:12 AM Reason for block: surgical anesthesia Staffing Performed: resident/CRNA  Anesthesiologist: Brennan Bailey, MD Resident/CRNA: Eben Burow, CRNA Performed by: Eben Burow, CRNA Authorized by: Brennan Bailey, MD   Preanesthetic Checklist Completed: patient identified, IV checked, site marked, risks and benefits discussed, surgical consent, monitors and equipment checked, pre-op evaluation and timeout performed Spinal Block Patient position: sitting Prep: DuraPrep and site prepped and draped Patient monitoring: continuous pulse ox, blood pressure, cardiac monitor and heart rate Approach: midline Location: L3-4 Injection technique: single-shot Needle Needle type: Pencan  Needle gauge: 24 G Needle length: 10 cm Additional Notes Pt placed in sitting position, spinal kit expiration date checked and verified, + CSF, - heme, pt tolerated well. Dr Daiva Huge present and supervising throughout SAB placement. Adequate sensory level.

## 2022-12-07 ENCOUNTER — Encounter (HOSPITAL_COMMUNITY): Payer: Self-pay | Admitting: Orthopedic Surgery

## 2022-12-07 LAB — CBC WITH DIFFERENTIAL/PLATELET
Abs Immature Granulocytes: 0.08 10*3/uL — ABNORMAL HIGH (ref 0.00–0.07)
Basophils Absolute: 0 10*3/uL (ref 0.0–0.1)
Basophils Relative: 0 %
Eosinophils Absolute: 0 10*3/uL (ref 0.0–0.5)
Eosinophils Relative: 0 %
HCT: 34.7 % — ABNORMAL LOW (ref 36.0–46.0)
Hemoglobin: 11.1 g/dL — ABNORMAL LOW (ref 12.0–15.0)
Immature Granulocytes: 1 %
Lymphocytes Relative: 16 %
Lymphs Abs: 2.6 10*3/uL (ref 0.7–4.0)
MCH: 29.6 pg (ref 26.0–34.0)
MCHC: 32 g/dL (ref 30.0–36.0)
MCV: 92.5 fL (ref 80.0–100.0)
Monocytes Absolute: 1.1 10*3/uL — ABNORMAL HIGH (ref 0.1–1.0)
Monocytes Relative: 7 %
Neutro Abs: 12.4 10*3/uL — ABNORMAL HIGH (ref 1.7–7.7)
Neutrophils Relative %: 76 %
Platelets: 267 10*3/uL (ref 150–400)
RBC: 3.75 MIL/uL — ABNORMAL LOW (ref 3.87–5.11)
RDW: 12.9 % (ref 11.5–15.5)
WBC: 16.2 10*3/uL — ABNORMAL HIGH (ref 4.0–10.5)
nRBC: 0 % (ref 0.0–0.2)

## 2022-12-07 LAB — BASIC METABOLIC PANEL
Anion gap: 9 (ref 5–15)
BUN: 13 mg/dL (ref 8–23)
CO2: 26 mmol/L (ref 22–32)
Calcium: 9.1 mg/dL (ref 8.9–10.3)
Chloride: 104 mmol/L (ref 98–111)
Creatinine, Ser: 0.73 mg/dL (ref 0.44–1.00)
GFR, Estimated: >60 mL/min (ref >60)
Glucose, Bld: 226 mg/dL — ABNORMAL HIGH (ref 70–99)
Potassium: 3.5 mmol/L (ref 3.5–5.1)
Sodium: 139 mmol/L (ref 135–145)

## 2022-12-07 LAB — GLUCOSE, CAPILLARY: Glucose-Capillary: 322 mg/dL — ABNORMAL HIGH (ref 70–99)

## 2022-12-07 MED ORDER — LACTATED RINGERS IV BOLUS
500.0000 mL | Freq: Once | INTRAVENOUS | Status: AC
  Administered 2022-12-07: 500 mL via INTRAVENOUS

## 2022-12-07 MED ORDER — POTASSIUM CHLORIDE 10 MEQ/100ML IV SOLN
10.0000 meq | Freq: Once | INTRAVENOUS | Status: AC
  Administered 2022-12-07: 10 meq via INTRAVENOUS
  Filled 2022-12-07: qty 100

## 2022-12-07 NOTE — Progress Notes (Signed)
Orthopedic Tech Progress Note Patient Details:  Elysabeth Hartnell October 03, 1956 [id number]  Patient ID: Mervin Hack, female   DOB: 03/29/1956, 67 y.o.   MRN: [id number]  [name] 12/07/2022, 4:52 PM Cpm removed

## 2022-12-07 NOTE — Progress Notes (Signed)
Physical Therapy Treatment Patient Details Name: Lori Bennett MRN: [id number] DOB: 10-21-1956 Today's Date: 12/07/2022   History of Present Illness 67 y.o. female admitted for R UKA to TKA conversion 12/06/22; PMH: L TKA 2020    PT Comments    Pt tolerated increased ambulation distance of 14' with RW, distance limited by headache. Pt remains somewhat groggy with a flat affect, though was more alert this session and kept her eyes open. Pt performed TKA exercises with supervision.     Recommendations for follow up therapy are one component of a multi-disciplinary discharge planning process, led by the attending physician.  Recommendations may be updated based on patient status, additional functional criteria and insurance authorization.  Follow Up Recommendations  Follow physician's recommendations for discharge plan and follow up therapies     Assistance Recommended at Discharge Intermittent Supervision/Assistance  Patient can return home with the following A little help with walking and/or transfers;A little help with bathing/dressing/bathroom;Assistance with cooking/housework;Assist for transportation;Help with stairs or ramp for entrance   Equipment Recommendations  None recommended by PT    Recommendations for Other Services       Precautions / Restrictions Precautions Precautions: Knee Precaution Booklet Issued: Yes (comment) Precaution Comments: reviewed no pillow under knee Restrictions Weight Bearing Restrictions: No Other Position/Activity Restrictions: WBAT     Mobility  Bed Mobility Overal bed mobility: Needs Assistance Bed Mobility: Sit to Supine     Supine to sit: Supervision, HOB elevated Sit to supine: Min assist   General bed mobility comments: VCs for technique, HOB up, self assisted RLE with LLE for sit to supine    Transfers Overall transfer level: Needs assistance Equipment used: Rolling walker (2 wheels) Transfers: Sit to/from Stand Sit to  Stand: Min guard           General transfer comment: VCs hand placement    Ambulation/Gait Ambulation/Gait assistance: Min guard Gait Distance (Feet): 14 Feet Assistive device: Rolling walker (2 wheels) Gait Pattern/deviations: Step-to pattern, Decreased step length - right, Decreased step length - left Gait velocity: decr     General Gait Details: VCs sequencing, no loss of balance, distance limited by headache. Eyes open this session but still somewhat groggy/lethargic, flat affect.   Stairs             Wheelchair Mobility    Modified Rankin (Stroke Patients Only)       Balance Overall balance assessment: Needs assistance Sitting-balance support: Feet supported Sitting balance-Leahy Scale: Fair     Standing balance support: Bilateral upper extremity supported Standing balance-Leahy Scale: Poor                              Cognition Arousal/Alertness: Lethargic, Suspect due to medications Behavior During Therapy: Flat affect Overall Cognitive Status: Within Functional Limits for tasks assessed                                 General Comments: pt still somewhat lethargic, but had eyes open most of this session, reports headache when in standing position        Exercises Total Joint Exercises Ankle Circles/Pumps: Both, AROM, 15 reps Quad Sets: AROM, Both, 5 reps, Supine Heel Slides: PROM, Right, 5 reps, Supine Long Arc Quad: AAROM, Right, 5 reps, Seated Knee Flexion: AAROM, Right, Seated, 10 reps Goniometric ROM: 5-50* AAROM R knee    General Comments  Pertinent Vitals/Pain Pain Assessment Pain Assessment: Faces Pain Score: 6  Faces Pain Scale: Hurts even more Pain Location: R knee with movement and headache Pain Descriptors / Indicators: Grimacing Pain Intervention(s): Limited activity within patient's tolerance, Monitored during session, Premedicated before session, Ice applied    Home Living                           Prior Function            PT Goals (current goals can now be found in the care plan section) Acute Rehab PT Goals Patient Stated Goal: walk PT Goal Formulation: With patient/family Time For Goal Achievement: 12/13/22 Potential to Achieve Goals: Good Progress towards PT goals: Progressing toward goals    Frequency    7X/week      PT Plan Current plan remains appropriate    Co-evaluation              AM-PAC PT "6 Clicks" Mobility   Outcome Measure  Help needed turning from your back to your side while in a flat bed without using bedrails?: A Little Help needed moving from lying on your back to sitting on the side of a flat bed without using bedrails?: A Little Help needed moving to and from a bed to a chair (including a wheelchair)?: A Little Help needed standing up from a chair using your arms (e.g., wheelchair or bedside chair)?: A Little Help needed to walk in hospital room?: A Little Help needed climbing 3-5 steps with a railing? : A Lot 6 Click Score: 17    End of Session Equipment Utilized During Treatment: Gait belt Activity Tolerance: Patient limited by lethargy (headache, lightheaded) Patient left: with call bell/phone within reach;in bed;with bed alarm set Nurse Communication: Mobility status PT Visit Diagnosis: Difficulty in walking, not elsewhere classified (R26.2);Pain;Other abnormalities of gait and mobility (R26.89);Muscle weakness (generalized) (M62.81) Pain - Right/Left: Right Pain - part of body: Knee     Time: [id number] PT Time Calculation (min) (ACUTE ONLY): 27 min  Charges:  $Gait Training: 8-22 mins $Therapeutic Exercise: 8-22 mins                     Blondell Reveal Kistler PT 12/07/2022  Acute Rehabilitation Services  Office [phone]

## 2022-12-07 NOTE — Progress Notes (Signed)
Orthopedic Tech Progress Note Patient Details:  Lori Bennett February 21, 1956 [id number]  Patient ID: Lori Bennett, female   DOB: [birth year], 67 y.o.   MRN: [id number]  Lori Bennett 12/07/2022, 2:45 PM Cpm applied

## 2022-12-07 NOTE — TOC Transition Note (Signed)
Transition of Care [facility]) - CM/SW Discharge Note   Patient Details  Name: Lori Bennett MRN: [id number] Date of Birth: 1955-12-27  [facility]) CM/SW Contact:  Lennart Pall, LCSW Phone Number: 12/07/2022, 10:38 AM   Clinical Narrative:     Met with pt and confirming she received DME to her home via [facility].  HHPT prearranged with [facility].  No TOC needs.  Final next level of care: [place] Barriers to Discharge: No Barriers Identified   Patient Goals and CMS Choice      Discharge Placement                         Discharge Plan and Services Additional resources added to the After Visit Summary for                  DME Arranged: N/A DME Agency: NA       HH Arranged: PT HH Agency: [facility]        Social Determinants of Health (SDOH) Interventions SDOH Screenings   Food Insecurity: No Food Insecurity (12/06/2022)  Housing: Low Risk  (12/06/2022)  Transportation Needs: No Transportation Needs (12/06/2022)  Utilities: Not At Risk (12/06/2022)  Tobacco Use: Low Risk  (12/06/2022)     Readmission Risk Interventions    12/07/2022   10:38 AM  Readmission Risk Prevention Plan  Post Dischage Appt Complete  Medication Screening Complete  Transportation Screening Complete

## 2022-12-07 NOTE — Progress Notes (Signed)
Physical Therapy Treatment Patient Details Name: Lori Bennett MRN: [id number] DOB: [birth year] Today's Date: 12/07/2022   History of Present Illness 67 y.o. female admitted for R UKA to TKA conversion 12/06/22; PMH: L TKA 2020    PT Comments    Pt ambulated 6' with RW, distance limited by headache and lightheadedness. Pt was assisted to recliner where BP was 156/78, HR 58, SpO2 96% on room air. Pt is groggy/lethargic, she kept her eyes closed most of session but can respond correctly to orientation questions. Initiated TKA HEP. Will attempt again this afternoon.     Recommendations for follow up therapy are one component of a multi-disciplinary discharge planning process, led by the attending physician.  Recommendations may be updated based on patient status, additional functional criteria and insurance authorization.  Follow Up Recommendations  Follow physician's recommendations for discharge plan and follow up therapies     Assistance Recommended at Discharge Intermittent Supervision/Assistance  Patient can return home with the following A little help with walking and/or transfers;A little help with bathing/dressing/bathroom;Assistance with cooking/housework;Assist for transportation;Help with stairs or ramp for entrance   Equipment Recommendations  None recommended by PT    Recommendations for Other Services       Precautions / Restrictions Precautions Precautions: Knee Precaution Comments: reviewed no pillow under knee Restrictions Weight Bearing Restrictions: No Other Position/Activity Restrictions: WBAT     Mobility  Bed Mobility Overal bed mobility: Needs Assistance Bed Mobility: Supine to Sit     Supine to sit: Supervision, HOB elevated     General bed mobility comments: VCs for technique, HOB up    Transfers Overall transfer level: Needs assistance Equipment used: Rolling walker (2 wheels) Transfers: Sit to/from Stand Sit to Stand: Min assist            General transfer comment: VCs hand placement, min A to power up    Ambulation/Gait Ambulation/Gait assistance: Min guard Gait Distance (Feet): 6 Feet Assistive device: Rolling walker (2 wheels) Gait Pattern/deviations: Step-to pattern, Decreased step length - right, Decreased step length - left Gait velocity: decr     General Gait Details: VCs sequencing, no loss of balance, distance limited by lightheadedness/headache. Assisted to recliner where BP was 156/78, HR 58, SpO2 96% on room air. Pt able to answer orientation questions appropriate. She is groggy, lethargic, eyes closed most of session.   Stairs             Wheelchair Mobility    Modified Rankin (Stroke Patients Only)       Balance Overall balance assessment: Needs assistance Sitting-balance support: Feet supported Sitting balance-Leahy Scale: Fair     Standing balance support: Bilateral upper extremity supported Standing balance-Leahy Scale: Poor                              Cognition Arousal/Alertness: Lethargic, Suspect due to medications Behavior During Therapy: Flat affect Overall Cognitive Status: Within Functional Limits for tasks assessed                                 General Comments: very groggy, able to respond correctly to orientation questions, slow to respond, eyes closed most of PT session, reports lightheaded and has headache in standing (RN notified)        Exercises Total Joint Exercises Ankle Circles/Pumps: Both, AROM, 15 reps Long Arc Quad: AAROM, Right, 5 reps, Seated Knee  Flexion: AAROM, Right, 10 reps, Seated    General Comments        Pertinent Vitals/Pain Pain Assessment Pain Assessment: Faces Faces Pain Scale: Hurts even more Pain Location: R knee with movement and headache Pain Descriptors / Indicators: Grimacing Pain Intervention(s): Limited activity within patient's tolerance, Monitored during session, Patient requesting pain meds-RN  notified, Premedicated before session, Ice applied    Home Living                          Prior Function            PT Goals (current goals can now be found in the care plan section) Acute Rehab PT Goals Patient Stated Goal: walk PT Goal Formulation: With patient/family Time For Goal Achievement: 12/13/22 Potential to Achieve Goals: Good Progress towards PT goals: Progressing toward goals    Frequency    7X/week      PT Plan Current plan remains appropriate    Co-evaluation              AM-PAC PT "6 Clicks" Mobility   Outcome Measure  Help needed turning from your back to your side while in a flat bed without using bedrails?: A Little Help needed moving from lying on your back to sitting on the side of a flat bed without using bedrails?: A Little Help needed moving to and from a bed to a chair (including a wheelchair)?: A Little Help needed standing up from a chair using your arms (e.g., wheelchair or bedside chair)?: A Little Help needed to walk in hospital room?: A Little Help needed climbing 3-5 steps with a railing? : A Lot 6 Click Score: 17    End of Session Equipment Utilized During Treatment: Gait belt Activity Tolerance: Patient limited by lethargy;Treatment limited secondary to medical complications (Comment) (headache, lightheaded) Patient left: with call bell/phone within reach;with family/visitor present;in chair;with chair alarm set Nurse Communication: Mobility status;Other (comment) (lethargic, headache, lightheaded) PT Visit Diagnosis: Difficulty in walking, not elsewhere classified (R26.2);Pain;Other abnormalities of gait and mobility (R26.89);Muscle weakness (generalized) (M62.81) Pain - Right/Left: Right Pain - part of body: Knee     Time: [id number] PT Time Calculation (min) (ACUTE ONLY): 27 min  Charges:  $Gait Training: 8-22 mins $Therapeutic Exercise: 8-22 mins                    Blondell Reveal Kistler PT 12/07/2022   Acute Rehabilitation Services  Office [phone]

## 2022-12-08 DIAGNOSIS — Z96652 Presence of left artificial knee joint: Secondary | ICD-10-CM | POA: Diagnosis not present

## 2022-12-08 DIAGNOSIS — M1712 Unilateral primary osteoarthritis, left knee: Secondary | ICD-10-CM | POA: Diagnosis not present

## 2022-12-08 MED ORDER — ACETAMINOPHEN 500 MG PO TABS: 1000.0000 mg | ORAL_TABLET | Freq: Four times a day (QID) | ORAL | 0 refills | Status: AC | PRN

## 2022-12-08 MED ORDER — SENNA-DOCUSATE SODIUM 8.6-50 MG PO TABS: 2.0000 | ORAL_TABLET | Freq: Every day | ORAL | 1 refills | Status: DC | PRN

## 2022-12-08 MED ORDER — OXYCODONE HCL 5 MG PO TABS: 5.0000 mg | ORAL_TABLET | ORAL | 0 refills | Status: DC | PRN

## 2022-12-08 MED ORDER — METHOCARBAMOL 750 MG PO TABS: 750.0000 mg | ORAL_TABLET | Freq: Three times a day (TID) | ORAL | 0 refills | Status: DC | PRN

## 2022-12-08 MED ORDER — ASPIRIN 81 MG PO TBEC: 81.0000 mg | DELAYED_RELEASE_TABLET | Freq: Two times a day (BID) | ORAL | 0 refills | Status: DC

## 2022-12-08 MED ORDER — ONDANSETRON 4 MG PO TBDP: 4.0000 mg | ORAL_TABLET | Freq: Three times a day (TID) | ORAL | 0 refills | Status: DC | PRN

## 2022-12-08 NOTE — Discharge Summary (Signed)
Physician Discharge Summary  Patient ID: Lori Bennett MRN: [id number] DOB/AGE: 06-13-1956 67 y.o.  Admit date: 12/06/2022 Discharge date: 12/08/2022  Admission Diagnoses: failed right partial knee prothesis  Discharge Diagnoses:  Principal Problem:   S/P total knee arthroplasty, right   Discharged Condition: fair  Hospital Course: Patient underwent a right knee unicompartmental arthroplasty conversion to total knee arthroplasty by Dr. Percell Miller on [id number] without complications. She spent 2 night in the hospital for pain control and mobilization. She has passed her PT evaluation and I s ready for discharge home with HHPT already set up.  Consults: None  Significant Diagnostic Studies: n/a  Treatments: IV hydration, antibiotics: Ancef, analgesia: acetaminophen, Dilaudid, and Oxycodone, anticoagulation: ASA, therapies: PT and SW, and surgery: right knee unicompartmental arthroplasty conversion to total knee arthroplasty  Discharge Exam: Blood pressure (!) 153/78, pulse 63, temperature 97.6 F (36.4 C), resp. rate 17, height 5' 3"$  (1.6 m), weight 70.9 kg, SpO2 96 %. General appearance: alert, cooperative, and no distress Head: Normocephalic, without obvious abnormality, atraumatic Resp: clear to auscultation bilaterally Cardio: normal apical impulse Extremities: extremities normal, atraumatic, no cyanosis or edema Pulses:  L brachial 2+ R brachial 2+  L radial 2+ R radial 2+  L inguinal 2+ R inguinal 2+  L popliteal 2+ R popliteal 2+  L posterior tibial 2+ R posterior tibial 2+  L dorsalis pedis 2+ R dorsalis pedis 2+   Neurologic: Grossly normal Incision/Wound: c/d/i  Disposition: Discharge disposition: 06-Home-Health Care Svc       Discharge Instructions     CPM   Complete by: As directed    Continuous passive motion machine (CPM):      Use the CPM from 0 to 90 degrees for 4-6 hours per day.      You may break it up into 2 or 3 sessions per day.      Use CPM for 3  weeks or until you are told to stop.   Call MD / Call 911   Complete by: As directed    If you experience chest pain or shortness of breath, CALL 911 and be transported to the hospital emergency room.  If you develope a fever above 101 F, pus (white drainage) or increased drainage or redness at the wound, or calf pain, call your surgeon's office.   Diet - low sodium heart healthy   Complete by: As directed    Discharge instructions   Complete by: As directed    You may bear weight as tolerated. Keep your dressing on and dry until follow up. Take medicine to prevent blood clots as directed. Take pain medicine as needed with the goal of transitioning to over the counter medicines.    INSTRUCTIONS AFTER JOINT REPLACEMENT   Remove items at home which could result in a fall. This includes throw rugs or furniture in walking pathways ICE to the affected joint every three hours while awake for 30 minutes at a time, for at least the first 3-5 days, and then as needed for pain and swelling.  Continue to use ice for pain and swelling. You may notice swelling that will progress down to the foot and ankle.  This is normal after surgery.  Elevate your leg when you are not up walking on it.   Continue to use the breathing machine you got in the hospital (incentive spirometer) which will help keep your temperature down.  It is common for your temperature to cycle up and down following surgery, especially at night  when you are not up moving around and exerting yourself.  The breathing machine keeps your lungs expanded and your temperature down.   DIET:  As you were doing prior to hospitalization, we recommend a well-balanced diet.  DRESSING / WOUND CARE / SHOWERING  You may shower 3 days after surgery, but keep the wounds dry during showering.  You may use an occlusive plastic wrap (Press'n Seal for example) with blue painter's tape at edges, NO SOAKING/SUBMERGING IN THE BATHTUB.  If the bandage gets wet,  call the office.   ACTIVITY  Increase activity slowly as tolerated, but follow the weight bearing instructions below.   No driving for 6 weeks or until further direction given by your physician.  You cannot drive while taking narcotics.  No lifting or carrying greater than 10 lbs. until further directed by your surgeon. Avoid periods of inactivity such as sitting longer than an hour when not asleep. This helps prevent blood clots.  You may return to work once you are authorized by your doctor.    WEIGHT BEARING   Weight bearing as tolerated with assist device (walker, cane, etc) as directed, use it as long as suggested by your surgeon or therapist, typically at least 4-6 weeks.   EXERCISES  Results after joint replacement surgery are often greatly improved when you follow the exercise, range of motion and muscle strengthening exercises prescribed by your doctor. Safety measures are also important to protect the joint from further injury. Any time any of these exercises cause you to have increased pain or swelling, decrease what you are doing until you are comfortable again and then slowly increase them. If you have problems or questions, call your caregiver or physical therapist for advice.   Rehabilitation is important following a joint replacement. After just a few days of immobilization, the muscles of the leg can become weakened and shrink (atrophy).  These exercises are designed to build up the tone and strength of the thigh and leg muscles and to improve motion. Often times heat used for twenty to thirty minutes before working out will loosen up your tissues and help with improving the range of motion but do not use heat for the first two weeks following surgery (sometimes heat can increase post-operative swelling).   These exercises can be done on a training (exercise) mat, on the floor, on a table or on a bed. Use whatever works the best and is most comfortable for you.    Use music or  television while you are exercising so that the exercises are a pleasant break in your day. This will make your life better with the exercises acting as a break in your routine that you can look forward to.   Perform all exercises about fifteen times, three times per day or as directed.  You should exercise both the operative leg and the other leg as well.  Exercises include:   Quad Sets - Tighten up the muscle on the front of the thigh (Quad) and hold for 5-10 seconds.   Straight Leg Raises - With your knee straight (if you were given a brace, keep it on), lift the leg to 60 degrees, hold for 3 seconds, and slowly lower the leg.  Perform this exercise against resistance later as your leg gets stronger.  Leg Slides: Lying on your back, slowly slide your foot toward your buttocks, bending your knee up off the floor (only go as far as is comfortable). Then slowly slide your foot back  down until your leg is flat on the floor again.  Angel Wings: Lying on your back spread your legs to the side as far apart as you can without causing discomfort.  Hamstring Strength:  Lying on your back, push your heel against the floor with your leg straight by tightening up the muscles of your buttocks.  Repeat, but this time bend your knee to a comfortable angle, and push your heel against the floor.  You may put a pillow under the heel to make it more comfortable if necessary.   A rehabilitation program following joint replacement surgery can speed recovery and prevent re-injury in the future due to weakened muscles. Contact your doctor or a physical therapist for more information on knee rehabilitation.    CONSTIPATION  Constipation is defined medically as fewer than three stools per week and severe constipation as less than one stool per week.  Even if you have a regular bowel pattern at home, your normal regimen is likely to be disrupted due to multiple reasons following surgery.  Combination of anesthesia,  postoperative narcotics, change in appetite and fluid intake all can affect your bowels.   YOU MUST use at least one of the following options; they are listed in order of increasing strength to get the job done.  They are all available over the counter, and you may need to use some, POSSIBLY even all of these options:    Drink plenty of fluids (prune juice may be helpful) and high fiber foods Colace 100 mg by mouth twice a day  Senokot for constipation as directed and as needed Dulcolax (bisacodyl), take with full glass of water  Miralax (polyethylene glycol) once or twice a day as needed.  If you have tried all these things and are unable to have a bowel movement in the first 3-4 days after surgery call either your surgeon or your primary doctor.    If you experience loose stools or diarrhea, hold the medications until you stool forms back up.  If your symptoms do not get better within 1 week or if they get worse, check with your doctor.  If you experience "the worst abdominal pain ever" or develop nausea or vomiting, please contact the office immediately for further recommendations for treatment.   ITCHING:  If you experience itching with your medications, try taking only a single pain pill, or even half a pain pill at a time.  You can also use Benadryl over the counter for itching or also to help with sleep.   TED HOSE STOCKINGS:  Use stockings on both legs until for at least 2 weeks or as directed by physician office. They may be removed at night for sleeping.  MEDICATIONS:  See your medication summary on the "After Visit Summary" that nursing will review with you.  You may have some home medications which will be placed on hold until you complete the course of blood thinner medication.  It is important for you to complete the blood thinner medication as prescribed.  Take medicines as prescribed.   You have several different medicines that work in different ways. - Tylenol is for mild to  moderate pain. Try to take this medicine before turning to your narcotic medicines.  - Meloxicam is to reduce pain / inflammation - Robaxin is for muscle spasms. This medicine can make you drowsy. - Oxycodone is a narcotic pain medicine.  Take this for severe pain. This medicine can be dehydrating / constipating. - Zofran is for  nausea and vomiting. - Senokot is for constipation prevention  - Aspirin is to prevent blood clots after surgery. YOU MUST TAKE THIS MEDICINE!  PRECAUTIONS:  If you experience chest pain or shortness of breath - call 911 immediately for transfer to the hospital emergency department.   If you develop a fever greater that 101 F, purulent drainage from wound, increased redness or drainage from wound, foul odor from the wound/dressing, or calf pain - CONTACT YOUR SURGEON.                                                   FOLLOW-UP APPOINTMENTS:  If you do not already have a post-op appointment, please call the office [phone] for an appointment to be seen by Dr. Percell Miller in 2 weeks.   OTHER INSTRUCTIONS:   MAKE SURE YOU:  Understand these instructions.  Get help right away if you are not doing well or get worse.    Thank you for letting us be a part of your medical care team.  It is a privilege we respect greatly.  We hope these instructions will help you stay on track for a fast and full recovery!   Do not put a pillow under the knee. Place it under the heel.   Complete by: As directed    Driving restrictions   Complete by: As directed    No driving for 2-4 weeks   Post-operative opioid taper instructions:   Complete by: As directed    POST-OPERATIVE OPIOID TAPER INSTRUCTIONS: It is important to wean off of your opioid medication as soon as possible. If you do not need pain medication after your surgery it is ok to stop day one. Opioids include: Codeine, Hydrocodone(Norco, Vicodin), Oxycodone(Percocet, oxycontin) and hydromorphone amongst others.  Long term  and even short term use of opiods can cause: Increased pain response Dependence Constipation Depression Respiratory depression And more.  Withdrawal symptoms can include Flu like symptoms Nausea, vomiting And more Techniques to manage these symptoms Hydrate well Eat regular healthy meals Stay active Use relaxation techniques(deep breathing, meditating, yoga) Do Not substitute Alcohol to help with tapering If you have been on opioids for less than two weeks and do not have pain than it is ok to stop all together.  Plan to wean off of opioids This plan should start within one week post op of your joint replacement. Maintain the same interval or time between taking each dose and first decrease the dose.  Cut the total daily intake of opioids by one tablet each day Next start to increase the time between doses. The last dose that should be eliminated is the evening dose.      TED hose   Complete by: As directed    Use stockings (TED hose) for 2 weeks on right leg(s).  You may remove them at night for sleeping.   Weight bearing as tolerated   Complete by: As directed       Allergies as of 12/08/2022       Reactions   Ace Inhibitors Cough   Clonidine Derivatives Other (See Comments)   Severe dry mouth and lethargy.        Medication List     TAKE these medications    acetaminophen 500 MG tablet Commonly known as: TYLENOL Take 2 tablets (1,000 mg total) by mouth every  6 (six) hours as needed for mild pain or moderate pain.   amLODipine 5 MG tablet Commonly known as: NORVASC Take 1 tablet (5 mg total) by mouth 2 (two) times daily. NEED OV. What changed:  when to take this additional instructions   aspirin EC 81 MG tablet Take 1 tablet (81 mg total) by mouth 2 (two) times daily. To prevent blood clots for 30 days after surgery.   atorvastatin 20 MG tablet Commonly known as: LIPITOR Take 20 mg by mouth at bedtime.   carvedilol 12.5 MG tablet Commonly known as:  COREG Take 1 tablet (12.5 mg total) by mouth 2 (two) times daily with a meal. NEED OV. What changed: additional instructions   Dulaglutide 1.5 MG/0.5ML Sopn Inject 1.5 mg into the skin every 7 (seven) days.   gabapentin 300 MG capsule Commonly known as: NEURONTIN Take 300 mg by mouth at bedtime as needed (pain).   irbesartan 150 MG tablet Commonly known as: AVAPRO Take 150 mg by mouth daily.   meloxicam 15 MG tablet Commonly known as: MOBIC Take 15 mg by mouth daily as needed for pain.   metFORMIN 500 MG 24 hr tablet Commonly known as: GLUCOPHAGE-XR Take 500 mg by mouth daily after supper.   methocarbamol 750 MG tablet Commonly known as: Robaxin-750 Take 1 tablet (750 mg total) by mouth every 8 (eight) hours as needed for muscle spasms.   ondansetron 4 MG disintegrating tablet Commonly known as: ZOFRAN-ODT Take 1 tablet (4 mg total) by mouth every 8 (eight) hours as needed for nausea or vomiting.   oxyCODONE 5 MG immediate release tablet Commonly known as: Roxicodone Take 1 tablet (5 mg total) by mouth every 4 (four) hours as needed for severe pain.   sennosides-docusate sodium 8.6-50 MG tablet Commonly known as: SENOKOT-S Take 2 tablets by mouth daily as needed for constipation (while taking narcotics).               Discharge Care Instructions  (From admission, onward)           Start     Ordered   12/08/22 0000  Weight bearing as tolerated        12/08/22 1316            Follow-up Information     Renette Butters, MD. Go on 12/16/2022.   Specialty: Orthopedic Surgery Why: at 11:30am Contact information: [address] [place] [address] [phone]         [facility] [place] Follow up.   Specialty: Mobile Why: to provide home physical therapy visits Contact information: [place] [address] [phone]                 Signed: Britt Bottom  PA-C 12/08/2022, 1:16 PM

## 2022-12-08 NOTE — Progress Notes (Signed)
Physical Therapy Treatment Patient Details Name: Lori Bennett MRN: [id number] DOB: 13-Nov-1955 Today's Date: 12/08/2022   History of Present Illness 67 y.o. female admitted for R UKA to TKA conversion 12/06/22; PMH: L TKA 2020    PT Comments    Pt is progressing very well this session. A&O x3;  amb ~ 130', reviewed stairs with pt/sister.  Pt is overall supervision to mod I, meeting PT goals and is motivated to d/c home, has extensive family support as well.    Recommendations for follow up therapy are one component of a multi-disciplinary discharge planning process, led by the attending physician.  Recommendations may be updated based on patient status, additional functional criteria and insurance authorization.  Follow Up Recommendations  Follow physician's recommendations for discharge plan and follow up therapies     Assistance Recommended at Discharge Intermittent Supervision/Assistance  Patient can return home with the following A little help with walking and/or transfers;A little help with bathing/dressing/bathroom;Assistance with cooking/housework;Assist for transportation;Help with stairs or ramp for entrance   Equipment Recommendations  None recommended by PT    Recommendations for Other Services       Precautions / Restrictions Precautions Precautions: Knee Precaution Comments: reviewed no pillow under knee Restrictions Weight Bearing Restrictions: No Other Position/Activity Restrictions: WBAT     Mobility  Bed Mobility Overal bed mobility: Needs Assistance Bed Mobility: Supine to Sit     Supine to sit: Supervision, HOB elevated     General bed mobility comments: for safety, no physical assist    Transfers Overall transfer level: Needs assistance Equipment used: Rolling walker (2 wheels) Transfers: Sit to/from Stand Sit to Stand: Min guard, Supervision           General transfer comment: VCs hand placement and R LE position     Ambulation/Gait Ambulation/Gait assistance: Supervision, Min guard Gait Distance (Feet): 130 Feet Assistive device: Rolling walker (2 wheels) Gait Pattern/deviations: Step-to pattern, Decreased stance time - right       General Gait Details: initial cues for sequence, progressing to step through gait with improved wt shift to RLE with incr distance   Stairs Stairs: Yes Stairs assistance: Min guard Stair Management: Step to pattern, Forwards, With walker Number of Stairs: 3 General stair comments: cues for technique and sequence. no LOB or knee buckling. pt sister present.   Wheelchair Mobility    Modified Rankin (Stroke Patients Only)       Balance   Sitting-balance support: Feet supported, No upper extremity supported Sitting balance-Leahy Scale: Good                                      Cognition Arousal/Alertness: Awake/alert Behavior During Therapy: WFL for tasks assessed/performed Overall Cognitive Status: Within Functional Limits for tasks assessed                                 General Comments: alert, Ox3, conversant; sister present for session        Exercises Total Joint Exercises Goniometric ROM: grossly 6 to 75 degrees knee flexion AROM    General Comments        Pertinent Vitals/Pain Pain Assessment Pain Assessment: Faces Faces Pain Scale: Hurts a little bit Pain Location: right knee Pain Descriptors / Indicators: Discomfort, Sore Pain Intervention(s): Limited activity within patient's tolerance, Monitored during session, Premedicated before session, Ice  applied    Home Living                          Prior Function            PT Goals (current goals can now be found in the care plan section) Acute Rehab PT Goals Patient Stated Goal: walk PT Goal Formulation: With patient/family Time For Goal Achievement: 12/13/22 Potential to Achieve Goals: Good Progress towards PT goals: Progressing  toward goals    Frequency    7X/week      PT Plan Current plan remains appropriate    Co-evaluation              AM-PAC PT "6 Clicks" Mobility   Outcome Measure  Help needed turning from your back to your side while in a flat bed without using bedrails?: None Help needed moving from lying on your back to sitting on the side of a flat bed without using bedrails?: None Help needed moving to and from a bed to a chair (including a wheelchair)?: A Little Help needed standing up from a chair using your arms (e.g., wheelchair or bedside chair)?: A Little Help needed to walk in hospital room?: A Little Help needed climbing 3-5 steps with a railing? : A Little 6 Click Score: 20    End of Session Equipment Utilized During Treatment: Gait belt Activity Tolerance: Patient tolerated treatment well Patient left: in chair;with call bell/phone within reach;with chair alarm set;with family/visitor present Nurse Communication: Mobility status PT Visit Diagnosis: Difficulty in walking, not elsewhere classified (R26.2);Pain;Other abnormalities of gait and mobility (R26.89);Muscle weakness (generalized) (M62.81) Pain - Right/Left: Right Pain - part of body: Knee     Time: 1042-1100 PT Time Calculation (min) (ACUTE ONLY): 18 min  Charges:  $Gait Training: 8-22 mins                     Baxter Flattery, PT  Acute Rehab Dept [facility]) [phone]  [facility] Weekend Pager [facility] only)  [phone]  12/08/2022    [facility] 12/08/2022, 11:13 AM

## 2022-12-10 DIAGNOSIS — Z471 Aftercare following joint replacement surgery: Secondary | ICD-10-CM | POA: Diagnosis not present

## 2022-12-10 DIAGNOSIS — Z7982 Long term (current) use of aspirin: Secondary | ICD-10-CM | POA: Diagnosis not present

## 2022-12-10 DIAGNOSIS — K5900 Constipation, unspecified: Secondary | ICD-10-CM | POA: Diagnosis not present

## 2022-12-10 DIAGNOSIS — T84032D Mechanical loosening of internal right knee prosthetic joint, subsequent encounter: Secondary | ICD-10-CM | POA: Diagnosis not present

## 2022-12-10 DIAGNOSIS — M792 Neuralgia and neuritis, unspecified: Secondary | ICD-10-CM | POA: Diagnosis not present

## 2022-12-10 DIAGNOSIS — R1310 Dysphagia, unspecified: Secondary | ICD-10-CM | POA: Diagnosis not present

## 2022-12-10 DIAGNOSIS — K219 Gastro-esophageal reflux disease without esophagitis: Secondary | ICD-10-CM | POA: Diagnosis not present

## 2022-12-10 DIAGNOSIS — I10 Essential (primary) hypertension: Secondary | ICD-10-CM | POA: Diagnosis not present

## 2022-12-10 DIAGNOSIS — E119 Type 2 diabetes mellitus without complications: Secondary | ICD-10-CM | POA: Diagnosis not present

## 2022-12-10 DIAGNOSIS — Z7985 Long-term (current) use of injectable non-insulin antidiabetic drugs: Secondary | ICD-10-CM | POA: Diagnosis not present

## 2022-12-10 DIAGNOSIS — Z7984 Long term (current) use of oral hypoglycemic drugs: Secondary | ICD-10-CM | POA: Diagnosis not present

## 2022-12-10 DIAGNOSIS — Z96652 Presence of left artificial knee joint: Secondary | ICD-10-CM | POA: Diagnosis not present

## 2022-12-12 DIAGNOSIS — Z7984 Long term (current) use of oral hypoglycemic drugs: Secondary | ICD-10-CM | POA: Diagnosis not present

## 2022-12-12 DIAGNOSIS — E119 Type 2 diabetes mellitus without complications: Secondary | ICD-10-CM | POA: Diagnosis not present

## 2022-12-12 DIAGNOSIS — R1310 Dysphagia, unspecified: Secondary | ICD-10-CM | POA: Diagnosis not present

## 2022-12-12 DIAGNOSIS — K219 Gastro-esophageal reflux disease without esophagitis: Secondary | ICD-10-CM | POA: Diagnosis not present

## 2022-12-12 DIAGNOSIS — I10 Essential (primary) hypertension: Secondary | ICD-10-CM | POA: Diagnosis not present

## 2022-12-12 DIAGNOSIS — Z96652 Presence of left artificial knee joint: Secondary | ICD-10-CM | POA: Diagnosis not present

## 2022-12-12 DIAGNOSIS — M792 Neuralgia and neuritis, unspecified: Secondary | ICD-10-CM | POA: Diagnosis not present

## 2022-12-12 DIAGNOSIS — T84032D Mechanical loosening of internal right knee prosthetic joint, subsequent encounter: Secondary | ICD-10-CM | POA: Diagnosis not present

## 2022-12-12 DIAGNOSIS — Z7985 Long-term (current) use of injectable non-insulin antidiabetic drugs: Secondary | ICD-10-CM | POA: Diagnosis not present

## 2022-12-12 DIAGNOSIS — K5900 Constipation, unspecified: Secondary | ICD-10-CM | POA: Diagnosis not present

## 2022-12-12 DIAGNOSIS — Z7982 Long term (current) use of aspirin: Secondary | ICD-10-CM | POA: Diagnosis not present

## 2022-12-14 DIAGNOSIS — K5900 Constipation, unspecified: Secondary | ICD-10-CM | POA: Diagnosis not present

## 2022-12-14 DIAGNOSIS — Z7982 Long term (current) use of aspirin: Secondary | ICD-10-CM | POA: Diagnosis not present

## 2022-12-14 DIAGNOSIS — I10 Essential (primary) hypertension: Secondary | ICD-10-CM | POA: Diagnosis not present

## 2022-12-14 DIAGNOSIS — Z96652 Presence of left artificial knee joint: Secondary | ICD-10-CM | POA: Diagnosis not present

## 2022-12-14 DIAGNOSIS — R1310 Dysphagia, unspecified: Secondary | ICD-10-CM | POA: Diagnosis not present

## 2022-12-14 DIAGNOSIS — E119 Type 2 diabetes mellitus without complications: Secondary | ICD-10-CM | POA: Diagnosis not present

## 2022-12-14 DIAGNOSIS — T84032D Mechanical loosening of internal right knee prosthetic joint, subsequent encounter: Secondary | ICD-10-CM | POA: Diagnosis not present

## 2022-12-14 DIAGNOSIS — Z7985 Long-term (current) use of injectable non-insulin antidiabetic drugs: Secondary | ICD-10-CM | POA: Diagnosis not present

## 2022-12-14 DIAGNOSIS — K219 Gastro-esophageal reflux disease without esophagitis: Secondary | ICD-10-CM | POA: Diagnosis not present

## 2022-12-14 DIAGNOSIS — M792 Neuralgia and neuritis, unspecified: Secondary | ICD-10-CM | POA: Diagnosis not present

## 2022-12-14 DIAGNOSIS — Z7984 Long term (current) use of oral hypoglycemic drugs: Secondary | ICD-10-CM | POA: Diagnosis not present

## 2022-12-16 DIAGNOSIS — I10 Essential (primary) hypertension: Secondary | ICD-10-CM | POA: Diagnosis not present

## 2022-12-16 DIAGNOSIS — M1711 Unilateral primary osteoarthritis, right knee: Secondary | ICD-10-CM | POA: Diagnosis not present

## 2022-12-16 DIAGNOSIS — Z96652 Presence of left artificial knee joint: Secondary | ICD-10-CM | POA: Diagnosis not present

## 2022-12-16 DIAGNOSIS — K5900 Constipation, unspecified: Secondary | ICD-10-CM | POA: Diagnosis not present

## 2022-12-16 DIAGNOSIS — Z7984 Long term (current) use of oral hypoglycemic drugs: Secondary | ICD-10-CM | POA: Diagnosis not present

## 2022-12-16 DIAGNOSIS — E119 Type 2 diabetes mellitus without complications: Secondary | ICD-10-CM | POA: Diagnosis not present

## 2022-12-16 DIAGNOSIS — Z7982 Long term (current) use of aspirin: Secondary | ICD-10-CM | POA: Diagnosis not present

## 2022-12-16 DIAGNOSIS — R1310 Dysphagia, unspecified: Secondary | ICD-10-CM | POA: Diagnosis not present

## 2022-12-16 DIAGNOSIS — T84032D Mechanical loosening of internal right knee prosthetic joint, subsequent encounter: Secondary | ICD-10-CM | POA: Diagnosis not present

## 2022-12-16 DIAGNOSIS — K219 Gastro-esophageal reflux disease without esophagitis: Secondary | ICD-10-CM | POA: Diagnosis not present

## 2022-12-16 DIAGNOSIS — M792 Neuralgia and neuritis, unspecified: Secondary | ICD-10-CM | POA: Diagnosis not present

## 2022-12-16 DIAGNOSIS — Z7985 Long-term (current) use of injectable non-insulin antidiabetic drugs: Secondary | ICD-10-CM | POA: Diagnosis not present

## 2022-12-21 DIAGNOSIS — E119 Type 2 diabetes mellitus without complications: Secondary | ICD-10-CM | POA: Diagnosis not present

## 2022-12-21 DIAGNOSIS — Z7984 Long term (current) use of oral hypoglycemic drugs: Secondary | ICD-10-CM | POA: Diagnosis not present

## 2022-12-21 DIAGNOSIS — Z7985 Long-term (current) use of injectable non-insulin antidiabetic drugs: Secondary | ICD-10-CM | POA: Diagnosis not present

## 2022-12-21 DIAGNOSIS — M792 Neuralgia and neuritis, unspecified: Secondary | ICD-10-CM | POA: Diagnosis not present

## 2022-12-21 DIAGNOSIS — Z96652 Presence of left artificial knee joint: Secondary | ICD-10-CM | POA: Diagnosis not present

## 2022-12-21 DIAGNOSIS — K219 Gastro-esophageal reflux disease without esophagitis: Secondary | ICD-10-CM | POA: Diagnosis not present

## 2022-12-21 DIAGNOSIS — I10 Essential (primary) hypertension: Secondary | ICD-10-CM | POA: Diagnosis not present

## 2022-12-21 DIAGNOSIS — Z7982 Long term (current) use of aspirin: Secondary | ICD-10-CM | POA: Diagnosis not present

## 2022-12-21 DIAGNOSIS — T84032D Mechanical loosening of internal right knee prosthetic joint, subsequent encounter: Secondary | ICD-10-CM | POA: Diagnosis not present

## 2022-12-21 DIAGNOSIS — K5900 Constipation, unspecified: Secondary | ICD-10-CM | POA: Diagnosis not present

## 2022-12-21 DIAGNOSIS — R1310 Dysphagia, unspecified: Secondary | ICD-10-CM | POA: Diagnosis not present

## 2022-12-23 DIAGNOSIS — Z96652 Presence of left artificial knee joint: Secondary | ICD-10-CM | POA: Diagnosis not present

## 2022-12-23 DIAGNOSIS — M792 Neuralgia and neuritis, unspecified: Secondary | ICD-10-CM | POA: Diagnosis not present

## 2022-12-23 DIAGNOSIS — K219 Gastro-esophageal reflux disease without esophagitis: Secondary | ICD-10-CM | POA: Diagnosis not present

## 2022-12-23 DIAGNOSIS — E119 Type 2 diabetes mellitus without complications: Secondary | ICD-10-CM | POA: Diagnosis not present

## 2022-12-23 DIAGNOSIS — R1310 Dysphagia, unspecified: Secondary | ICD-10-CM | POA: Diagnosis not present

## 2022-12-23 DIAGNOSIS — I10 Essential (primary) hypertension: Secondary | ICD-10-CM | POA: Diagnosis not present

## 2022-12-23 DIAGNOSIS — Z7982 Long term (current) use of aspirin: Secondary | ICD-10-CM | POA: Diagnosis not present

## 2022-12-23 DIAGNOSIS — K5900 Constipation, unspecified: Secondary | ICD-10-CM | POA: Diagnosis not present

## 2022-12-23 DIAGNOSIS — Z7985 Long-term (current) use of injectable non-insulin antidiabetic drugs: Secondary | ICD-10-CM | POA: Diagnosis not present

## 2022-12-23 DIAGNOSIS — T84032D Mechanical loosening of internal right knee prosthetic joint, subsequent encounter: Secondary | ICD-10-CM | POA: Diagnosis not present

## 2022-12-23 DIAGNOSIS — Z7984 Long term (current) use of oral hypoglycemic drugs: Secondary | ICD-10-CM | POA: Diagnosis not present

## 2022-12-27 DIAGNOSIS — Z13828 Encounter for screening for other musculoskeletal disorder: Secondary | ICD-10-CM | POA: Diagnosis not present

## 2022-12-27 DIAGNOSIS — E1142 Type 2 diabetes mellitus with diabetic polyneuropathy: Secondary | ICD-10-CM | POA: Diagnosis not present

## 2022-12-27 DIAGNOSIS — Z713 Dietary counseling and surveillance: Secondary | ICD-10-CM | POA: Diagnosis not present

## 2022-12-27 DIAGNOSIS — I10 Essential (primary) hypertension: Secondary | ICD-10-CM | POA: Diagnosis not present

## 2022-12-28 DIAGNOSIS — M25661 Stiffness of right knee, not elsewhere classified: Secondary | ICD-10-CM | POA: Diagnosis not present

## 2022-12-28 DIAGNOSIS — R262 Difficulty in walking, not elsewhere classified: Secondary | ICD-10-CM | POA: Diagnosis not present

## 2022-12-28 DIAGNOSIS — M6281 Muscle weakness (generalized): Secondary | ICD-10-CM | POA: Diagnosis not present

## 2022-12-28 DIAGNOSIS — M1711 Unilateral primary osteoarthritis, right knee: Secondary | ICD-10-CM | POA: Diagnosis not present

## 2023-01-02 DIAGNOSIS — M1711 Unilateral primary osteoarthritis, right knee: Secondary | ICD-10-CM | POA: Diagnosis not present

## 2023-01-02 DIAGNOSIS — R262 Difficulty in walking, not elsewhere classified: Secondary | ICD-10-CM | POA: Diagnosis not present

## 2023-01-02 DIAGNOSIS — M25661 Stiffness of right knee, not elsewhere classified: Secondary | ICD-10-CM | POA: Diagnosis not present

## 2023-01-02 DIAGNOSIS — M6281 Muscle weakness (generalized): Secondary | ICD-10-CM | POA: Diagnosis not present

## 2023-01-05 DIAGNOSIS — M6281 Muscle weakness (generalized): Secondary | ICD-10-CM | POA: Diagnosis not present

## 2023-01-05 DIAGNOSIS — R262 Difficulty in walking, not elsewhere classified: Secondary | ICD-10-CM | POA: Diagnosis not present

## 2023-01-05 DIAGNOSIS — M1711 Unilateral primary osteoarthritis, right knee: Secondary | ICD-10-CM | POA: Diagnosis not present

## 2023-01-05 DIAGNOSIS — M25661 Stiffness of right knee, not elsewhere classified: Secondary | ICD-10-CM | POA: Diagnosis not present

## 2023-01-09 DIAGNOSIS — M1711 Unilateral primary osteoarthritis, right knee: Secondary | ICD-10-CM | POA: Diagnosis not present

## 2023-01-09 DIAGNOSIS — R262 Difficulty in walking, not elsewhere classified: Secondary | ICD-10-CM | POA: Diagnosis not present

## 2023-01-09 DIAGNOSIS — M25661 Stiffness of right knee, not elsewhere classified: Secondary | ICD-10-CM | POA: Diagnosis not present

## 2023-01-09 DIAGNOSIS — M6281 Muscle weakness (generalized): Secondary | ICD-10-CM | POA: Diagnosis not present

## 2023-01-12 DIAGNOSIS — M25661 Stiffness of right knee, not elsewhere classified: Secondary | ICD-10-CM | POA: Diagnosis not present

## 2023-01-12 DIAGNOSIS — M1711 Unilateral primary osteoarthritis, right knee: Secondary | ICD-10-CM | POA: Diagnosis not present

## 2023-01-12 DIAGNOSIS — M6281 Muscle weakness (generalized): Secondary | ICD-10-CM | POA: Diagnosis not present

## 2023-01-12 DIAGNOSIS — R262 Difficulty in walking, not elsewhere classified: Secondary | ICD-10-CM | POA: Diagnosis not present

## 2023-01-13 DIAGNOSIS — M1711 Unilateral primary osteoarthritis, right knee: Secondary | ICD-10-CM | POA: Diagnosis not present

## 2023-01-23 DIAGNOSIS — R262 Difficulty in walking, not elsewhere classified: Secondary | ICD-10-CM | POA: Diagnosis not present

## 2023-01-23 DIAGNOSIS — M1711 Unilateral primary osteoarthritis, right knee: Secondary | ICD-10-CM | POA: Diagnosis not present

## 2023-01-23 DIAGNOSIS — M25661 Stiffness of right knee, not elsewhere classified: Secondary | ICD-10-CM | POA: Diagnosis not present

## 2023-01-23 DIAGNOSIS — M6281 Muscle weakness (generalized): Secondary | ICD-10-CM | POA: Diagnosis not present

## 2023-01-25 ENCOUNTER — Telehealth: Payer: Self-pay | Admitting: Gastroenterology

## 2023-01-25 NOTE — Telephone Encounter (Signed)
Left message for patient, We do not have a [facility] on file. We need to know which [facility] she needs prep sent to

## 2023-01-25 NOTE — Telephone Encounter (Signed)
Patient called regarding her prep medication not yet sent to [facility] on file.

## 2023-01-26 DIAGNOSIS — M6281 Muscle weakness (generalized): Secondary | ICD-10-CM | POA: Diagnosis not present

## 2023-01-26 DIAGNOSIS — R262 Difficulty in walking, not elsewhere classified: Secondary | ICD-10-CM | POA: Diagnosis not present

## 2023-01-26 DIAGNOSIS — M25661 Stiffness of right knee, not elsewhere classified: Secondary | ICD-10-CM | POA: Diagnosis not present

## 2023-01-26 DIAGNOSIS — M1711 Unilateral primary osteoarthritis, right knee: Secondary | ICD-10-CM | POA: Diagnosis not present

## 2023-02-02 DIAGNOSIS — M25661 Stiffness of right knee, not elsewhere classified: Secondary | ICD-10-CM | POA: Diagnosis not present

## 2023-02-02 DIAGNOSIS — M6281 Muscle weakness (generalized): Secondary | ICD-10-CM | POA: Diagnosis not present

## 2023-02-02 DIAGNOSIS — R262 Difficulty in walking, not elsewhere classified: Secondary | ICD-10-CM | POA: Diagnosis not present

## 2023-02-02 DIAGNOSIS — M1711 Unilateral primary osteoarthritis, right knee: Secondary | ICD-10-CM | POA: Diagnosis not present

## 2023-02-06 DIAGNOSIS — M6281 Muscle weakness (generalized): Secondary | ICD-10-CM | POA: Diagnosis not present

## 2023-02-06 DIAGNOSIS — M1711 Unilateral primary osteoarthritis, right knee: Secondary | ICD-10-CM | POA: Diagnosis not present

## 2023-02-06 DIAGNOSIS — M25661 Stiffness of right knee, not elsewhere classified: Secondary | ICD-10-CM | POA: Diagnosis not present

## 2023-02-06 DIAGNOSIS — R262 Difficulty in walking, not elsewhere classified: Secondary | ICD-10-CM | POA: Diagnosis not present

## 2023-02-09 DIAGNOSIS — R262 Difficulty in walking, not elsewhere classified: Secondary | ICD-10-CM | POA: Diagnosis not present

## 2023-02-09 DIAGNOSIS — M6281 Muscle weakness (generalized): Secondary | ICD-10-CM | POA: Diagnosis not present

## 2023-02-09 DIAGNOSIS — M1711 Unilateral primary osteoarthritis, right knee: Secondary | ICD-10-CM | POA: Diagnosis not present

## 2023-02-09 DIAGNOSIS — M25661 Stiffness of right knee, not elsewhere classified: Secondary | ICD-10-CM | POA: Diagnosis not present

## 2023-02-13 DIAGNOSIS — M6281 Muscle weakness (generalized): Secondary | ICD-10-CM | POA: Diagnosis not present

## 2023-02-13 DIAGNOSIS — M25661 Stiffness of right knee, not elsewhere classified: Secondary | ICD-10-CM | POA: Diagnosis not present

## 2023-02-13 DIAGNOSIS — R262 Difficulty in walking, not elsewhere classified: Secondary | ICD-10-CM | POA: Diagnosis not present

## 2023-02-13 DIAGNOSIS — M1711 Unilateral primary osteoarthritis, right knee: Secondary | ICD-10-CM | POA: Diagnosis not present

## 2023-02-15 ENCOUNTER — Encounter: Payer: Self-pay | Admitting: Gastroenterology

## 2023-02-16 DIAGNOSIS — M25661 Stiffness of right knee, not elsewhere classified: Secondary | ICD-10-CM | POA: Diagnosis not present

## 2023-02-16 DIAGNOSIS — M6281 Muscle weakness (generalized): Secondary | ICD-10-CM | POA: Diagnosis not present

## 2023-02-16 DIAGNOSIS — R262 Difficulty in walking, not elsewhere classified: Secondary | ICD-10-CM | POA: Diagnosis not present

## 2023-02-16 DIAGNOSIS — M1711 Unilateral primary osteoarthritis, right knee: Secondary | ICD-10-CM | POA: Diagnosis not present

## 2023-02-20 ENCOUNTER — Other Ambulatory Visit: Payer: Self-pay

## 2023-02-20 ENCOUNTER — Telehealth: Payer: Self-pay | Admitting: Gastroenterology

## 2023-02-20 MED ORDER — NA SULFATE-K SULFATE-MG SULF 17.5-3.13-1.6 GM/177ML PO SOLN: ORAL | 0 refills | Status: DC

## 2023-02-20 NOTE — Telephone Encounter (Signed)
PT is calling to have SuPrep sent to pharmacy. Should be sent to [facility] on [address].

## 2023-02-20 NOTE — Telephone Encounter (Signed)
Prescription for Suprep transmitted.

## 2023-02-23 DIAGNOSIS — R262 Difficulty in walking, not elsewhere classified: Secondary | ICD-10-CM | POA: Diagnosis not present

## 2023-02-23 DIAGNOSIS — M6281 Muscle weakness (generalized): Secondary | ICD-10-CM | POA: Diagnosis not present

## 2023-02-23 DIAGNOSIS — M25661 Stiffness of right knee, not elsewhere classified: Secondary | ICD-10-CM | POA: Diagnosis not present

## 2023-02-23 DIAGNOSIS — M1711 Unilateral primary osteoarthritis, right knee: Secondary | ICD-10-CM | POA: Diagnosis not present

## 2023-02-24 ENCOUNTER — Ambulatory Visit: Payer: Medicare HMO | Admitting: Gastroenterology

## 2023-02-24 ENCOUNTER — Telehealth: Payer: Self-pay | Admitting: *Deleted

## 2023-02-24 ENCOUNTER — Encounter: Payer: Self-pay | Admitting: Gastroenterology

## 2023-02-24 NOTE — Telephone Encounter (Signed)
Pt arrived for procedure today and had taken trulicity on Tuesday 02/21/23.  Per anesthesia, pt can not have sedation today.  Offered to patient to have procedure without sedation.  Pt declined.  Rescheduled for 03/17/23.  Plenvu sample given and instructions reviewed with patient.

## 2023-02-27 DIAGNOSIS — Z008 Encounter for other general examination: Secondary | ICD-10-CM | POA: Diagnosis not present

## 2023-02-27 DIAGNOSIS — Z800 Family history of malignant neoplasm of digestive organs: Secondary | ICD-10-CM | POA: Diagnosis not present

## 2023-02-27 DIAGNOSIS — Z833 Family history of diabetes mellitus: Secondary | ICD-10-CM | POA: Diagnosis not present

## 2023-02-27 DIAGNOSIS — M1990 Unspecified osteoarthritis, unspecified site: Secondary | ICD-10-CM | POA: Diagnosis not present

## 2023-02-27 DIAGNOSIS — E1142 Type 2 diabetes mellitus with diabetic polyneuropathy: Secondary | ICD-10-CM | POA: Diagnosis not present

## 2023-02-27 DIAGNOSIS — E785 Hyperlipidemia, unspecified: Secondary | ICD-10-CM | POA: Diagnosis not present

## 2023-02-27 DIAGNOSIS — Z794 Long term (current) use of insulin: Secondary | ICD-10-CM | POA: Diagnosis not present

## 2023-02-27 DIAGNOSIS — Z8249 Family history of ischemic heart disease and other diseases of the circulatory system: Secondary | ICD-10-CM | POA: Diagnosis not present

## 2023-02-27 DIAGNOSIS — I10 Essential (primary) hypertension: Secondary | ICD-10-CM | POA: Diagnosis not present

## 2023-03-02 DIAGNOSIS — M1711 Unilateral primary osteoarthritis, right knee: Secondary | ICD-10-CM | POA: Diagnosis not present

## 2023-03-02 DIAGNOSIS — M25661 Stiffness of right knee, not elsewhere classified: Secondary | ICD-10-CM | POA: Diagnosis not present

## 2023-03-02 DIAGNOSIS — M6281 Muscle weakness (generalized): Secondary | ICD-10-CM | POA: Diagnosis not present

## 2023-03-02 DIAGNOSIS — R262 Difficulty in walking, not elsewhere classified: Secondary | ICD-10-CM | POA: Diagnosis not present

## 2023-03-03 DIAGNOSIS — M1711 Unilateral primary osteoarthritis, right knee: Secondary | ICD-10-CM | POA: Diagnosis not present

## 2023-03-09 DIAGNOSIS — M1711 Unilateral primary osteoarthritis, right knee: Secondary | ICD-10-CM | POA: Diagnosis not present

## 2023-03-09 DIAGNOSIS — M6281 Muscle weakness (generalized): Secondary | ICD-10-CM | POA: Diagnosis not present

## 2023-03-09 DIAGNOSIS — R262 Difficulty in walking, not elsewhere classified: Secondary | ICD-10-CM | POA: Diagnosis not present

## 2023-03-09 DIAGNOSIS — M25661 Stiffness of right knee, not elsewhere classified: Secondary | ICD-10-CM | POA: Diagnosis not present

## 2023-03-17 ENCOUNTER — Ambulatory Visit (AMBULATORY_SURGERY_CENTER): Payer: Medicare HMO | Admitting: Gastroenterology

## 2023-03-17 ENCOUNTER — Encounter: Payer: Self-pay | Admitting: Gastroenterology

## 2023-03-17 VITALS — BP 144/72 | HR 64 | Temp 97.1°F | Resp 15 | Ht 63.0 in | Wt 158.0 lb

## 2023-03-17 DIAGNOSIS — Z1211 Encounter for screening for malignant neoplasm of colon: Secondary | ICD-10-CM | POA: Diagnosis not present

## 2023-03-17 DIAGNOSIS — D123 Benign neoplasm of transverse colon: Secondary | ICD-10-CM

## 2023-03-17 DIAGNOSIS — K635 Polyp of colon: Secondary | ICD-10-CM | POA: Diagnosis not present

## 2023-03-17 DIAGNOSIS — E119 Type 2 diabetes mellitus without complications: Secondary | ICD-10-CM | POA: Diagnosis not present

## 2023-03-17 DIAGNOSIS — I10 Essential (primary) hypertension: Secondary | ICD-10-CM | POA: Diagnosis not present

## 2023-03-17 HISTORY — PX: COLONOSCOPY WITH PROPOFOL: SHX5780

## 2023-03-17 MED ORDER — SODIUM CHLORIDE 0.9 % IV SOLN: 500.0000 mL | Freq: Once | INTRAVENOUS | Status: DC

## 2023-03-17 NOTE — Op Note (Signed)
[facility] Patient Name: Lori Bennett Procedure Date: 03/17/2023 10:32 AM MRN: [id number] Endoscopist: Napoleon Form , MD, [id number] Age: 67 Referring MD:  Date of Birth: 08-Jan-1956 Gender: Female Account #: [id number] Procedure:                Colonoscopy Indications:              Screening for colorectal malignant neoplasm Medicines:                Monitored Anesthesia Care Procedure:                Pre-Anesthesia Assessment:                           - Prior to the procedure, a History and Physical                            was performed, and patient medications and                            allergies were reviewed. The patient's tolerance of                            previous anesthesia was also reviewed. The risks                            and benefits of the procedure and the sedation                            options and risks were discussed with the patient.                            All questions were answered, and informed consent                            was obtained. Prior Anticoagulants: The patient has                            taken no anticoagulant or antiplatelet agents. ASA                            Grade Assessment: II - A patient with mild systemic                            disease. After reviewing the risks and benefits,                            the patient was deemed in satisfactory condition to                            undergo the procedure.                           After obtaining informed consent, the colonoscope  was passed under direct vision. Throughout the                            procedure, the patient's blood pressure, pulse, and                            oxygen saturations were monitored continuously. The                            PCF-HQ190L Colonoscope [id number] was introduced                            through the anus and advanced to the the cecum,                            identified by  appendiceal orifice and ileocecal                            valve. The colonoscopy was performed without                            difficulty. The patient tolerated the procedure                            well. The quality of the bowel preparation was                            good. The ileocecal valve, appendiceal orifice, and                            rectum were photographed. Scope In: 10:39:28 AM Scope Out: 11:00:40 AM Scope Withdrawal Time: 0 hours 9 minutes 12 seconds  Total Procedure Duration: 0 hours 21 minutes 12 seconds  Findings:                 The perianal and digital rectal examinations were                            normal.                           A 7 mm polyp was found in the transverse colon. The                            polyp was semi-pedunculated. The polyp was removed                            with a hot snare. Resection and retrieval were                            complete.                           Scattered large-mouthed, medium-mouthed and  small-mouthed diverticula were found in the sigmoid                            colon, descending colon, transverse colon,                            ascending colon and cecum.                           Non-bleeding external and internal hemorrhoids were                            found during retroflexion. The hemorrhoids were                            medium-sized. Complications:            No immediate complications. Estimated Blood Loss:     Estimated blood loss was minimal. Impression:               - One 7 mm polyp in the transverse colon, removed                            with a hot snare. Resected and retrieved.                           - Moderate diverticulosis in the sigmoid colon, in                            the descending colon, in the transverse colon, in                            the ascending colon and in the cecum.                           - Non-bleeding external and  internal hemorrhoids. Recommendation:           - Patient has a contact number available for                            emergencies. The signs and symptoms of potential                            delayed complications were discussed with the                            patient. Return to normal activities tomorrow.                            Written discharge instructions were provided to the                            patient.                           - Resume previous diet.                           -  Continue present medications.                           - Await pathology results.                           - Repeat colonoscopy in 5-10 years for surveillance                            based on pathology results. Napoleon Form, MD 03/17/2023 11:06:25 AM This report has been signed electronically.

## 2023-03-17 NOTE — Progress Notes (Signed)
Called to room to assist during endoscopic procedure.  Patient ID and intended procedure confirmed with present staff. Received instructions for my participation in the procedure from the performing physician.  

## 2023-03-17 NOTE — Progress Notes (Signed)
Uneventful anesthetic. Report to pacu rn. Vss. Care resumed by rn. 

## 2023-03-17 NOTE — Patient Instructions (Signed)
Handouts Provided:  Polyps and Diverticulosis  YOU HAD AN ENDOSCOPIC PROCEDURE TODAY AT THE [facility]:   Refer to the procedure report that was given to you for any specific questions about what was found during the examination.  If the procedure report does not answer your questions, please call your gastroenterologist to clarify.  If you requested that your care partner not be given the details of your procedure findings, then the procedure report has been included in a sealed envelope for you to review at your convenience later.  YOU SHOULD EXPECT: Some feelings of bloating in the abdomen. Passage of more gas than usual.  Walking can help get rid of the air that was put into your GI tract during the procedure and reduce the bloating. If you had a lower endoscopy (such as a colonoscopy or flexible sigmoidoscopy) you may notice spotting of blood in your stool or on the toilet paper. If you underwent a bowel prep for your procedure, you may not have a normal bowel movement for a few days.  Please Note:  You might notice some irritation and congestion in your nose or some drainage.  This is from the oxygen used during your procedure.  There is no need for concern and it should clear up in a day or so.  SYMPTOMS TO REPORT IMMEDIATELY:  Following lower endoscopy (colonoscopy or flexible sigmoidoscopy):  Excessive amounts of blood in the stool  Significant tenderness or worsening of abdominal pains  Swelling of the abdomen that is new, acute  Fever of 100F or higher  For urgent or emergent issues, a gastroenterologist can be reached at any hour by calling [phone]. Do not use MyChart messaging for urgent concerns.    DIET:  We do recommend a small meal at first, but then you may proceed to your regular diet.  Drink plenty of fluids but you should avoid alcoholic beverages for 24 hours.  ACTIVITY:  You should plan to take it easy for the rest of today and you should NOT DRIVE  or use heavy machinery until tomorrow (because of the sedation medicines used during the test).    FOLLOW UP: Our staff will call the number listed on your records the next business day following your procedure.  We will call around 7:15- 8:00 am to check on you and address any questions or concerns that you may have regarding the information given to you following your procedure. If we do not reach you, we will leave a message.     If any biopsies were taken you will be contacted by phone or by letter within the next 1-3 weeks.  Please call us at [phone] if you have not heard about the biopsies in 3 weeks.    SIGNATURES/CONFIDENTIALITY: You and/or your care partner have signed paperwork which will be entered into your electronic medical record.  These signatures attest to the fact that that the information above on your After Visit Summary has been reviewed and is understood.  Full responsibility of the confidentiality of this discharge information lies with you and/or your care-partner.  

## 2023-03-17 NOTE — Progress Notes (Signed)
[facility] History and Physical   Primary Care Physician:  Melida Quitter, MD   Reason for Procedure:  Colorectal cancer screening  Plan:    Screening colonoscopy with possible interventions as needed     HPI: Lori Bennett is a very pleasant 67 y.o. female here for screening colonoscopy. Denies any nausea, vomiting, abdominal pain, melena or bright red blood per rectum  The risks and benefits as well as alternatives of endoscopic procedure(s) have been discussed and reviewed. All questions answered. The patient agrees to proceed.    Past Medical History:  Diagnosis Date   Arthritis    Diabetes mellitus without complication (HCC)    type 2 , does not check cbg at home    History of blood transfusion 30 years ago   after vaginal delivery    HTN (hypertension)    Nerve pain    due to nerve pain brought on by prior right knee surgery    Noncompliance with medications    PONV (postoperative nausea and vomiting)     Past Surgical History:  Procedure Laterality Date   BALLOON DILATION N/A 02/09/2021   Procedure: BALLOON DILATION;  Surgeon: Napoleon Form, MD;  Location: [facility];  Service: Endoscopy;  Laterality: N/A;   COLONOSCOPY  2015   Dr Loreta Ave   CONVERSION TO TOTAL KNEE Right 12/06/2022   Procedure: CONVERSION TO TOTAL KNEE;  Surgeon: Sheral Apley, MD;  Location: [facility];  Service: Orthopedics;  Laterality: Right;   ESOPHAGOGASTRODUODENOSCOPY (EGD) WITH PROPOFOL N/A 02/09/2021   Procedure: ESOPHAGOGASTRODUODENOSCOPY (EGD) WITH PROPOFOL;  Surgeon: Napoleon Form, MD;  Location: [facility];  Service: Endoscopy;  Laterality: N/A;   PARTIAL KNEE ARTHROPLASTY Right 05/30/2018   done at [facility] by dr tim murphy   partial knee revision  Right 06/14/2018   ROTATOR CUFF REPAIR Right    TOTAL KNEE ARTHROPLASTY Left 04/30/2019   Procedure: TOTAL KNEE ARTHROPLASTY;  Surgeon: Sheral Apley, MD;  Location: [facility];  Service:  Orthopedics;  Laterality: Left;   TUBAL LIGATION      Prior to Admission medications   Medication Sig Start Date End Date Taking? Authorizing Provider  amLODipine (NORVASC) 5 MG tablet Take 1 tablet (5 mg total) by mouth 2 (two) times daily. NEED OV. Patient taking differently: Take 5 mg by mouth daily. 06/18/18  Yes [place], Peter M, MD  atorvastatin (LIPITOR) 20 MG tablet Take 20 mg by mouth at bedtime.   Yes [provider]  carvedilol (COREG) 12.5 MG tablet Take 1 tablet (12.5 mg total) by mouth 2 (two) times daily with a meal. NEED OV. Patient taking differently: Take 12.5 mg by mouth 2 (two) times daily with a meal. 06/18/18  Yes [place], Peter M, MD  irbesartan (AVAPRO) 150 MG tablet Take 150 mg by mouth daily. 09/27/22  Yes [provider]  acetaminophen (TYLENOL) 500 MG tablet Take 2 tablets (1,000 mg total) by mouth every 6 (six) hours as needed for mild pain or moderate pain. 12/08/22   Jenne Pane, PA-C  Dulaglutide 1.5 MG/0.5ML SOPN Inject 1.5 mg into the skin every 7 (seven) days.    [provider]  gabapentin (NEURONTIN) 300 MG capsule Take 300 mg by mouth at bedtime as needed (pain). 09/27/22   [provider]  meloxicam (MOBIC) 15 MG tablet Take 15 mg by mouth daily as needed for pain.    [provider]  methocarbamol (ROBAXIN-750) 750 MG tablet Take 1 tablet (750 mg  total) by mouth every 8 (eight) hours as needed for muscle spasms. 12/08/22   Jenne Pane, PA-C    Current Outpatient Medications  Medication Sig Dispense Refill   amLODipine (NORVASC) 5 MG tablet Take 1 tablet (5 mg total) by mouth 2 (two) times daily. NEED OV. (Patient taking differently: Take 5 mg by mouth daily.) 180 tablet 0   atorvastatin (LIPITOR) 20 MG tablet Take 20 mg by mouth at bedtime.     carvedilol (COREG) 12.5 MG tablet Take 1 tablet (12.5 mg total) by mouth 2 (two) times daily with a meal. NEED OV. (Patient taking differently: Take 12.5 mg by mouth 2  (two) times daily with a meal.) 180 tablet 0   irbesartan (AVAPRO) 150 MG tablet Take 150 mg by mouth daily.     acetaminophen (TYLENOL) 500 MG tablet Take 2 tablets (1,000 mg total) by mouth every 6 (six) hours as needed for mild pain or moderate pain. 60 tablet 0   Dulaglutide 1.5 MG/0.5ML SOPN Inject 1.5 mg into the skin every 7 (seven) days.     gabapentin (NEURONTIN) 300 MG capsule Take 300 mg by mouth at bedtime as needed (pain).     meloxicam (MOBIC) 15 MG tablet Take 15 mg by mouth daily as needed for pain.     methocarbamol (ROBAXIN-750) 750 MG tablet Take 1 tablet (750 mg total) by mouth every 8 (eight) hours as needed for muscle spasms. 20 tablet 0   Current Facility-Administered Medications  Medication Dose Route Frequency Provider Last Rate Last Admin   0.9 %  sodium chloride infusion  500 mL Intravenous Once Napoleon Form, MD        Allergies as of 03/17/2023 - Review Complete 03/17/2023  Allergen Reaction Noted   Ace inhibitors Cough 01/27/2011   Clonidine derivatives Other (See Comments) 02/01/2011    Family History  Problem Relation Age of Onset   Hypertension Mother    Colon cancer Neg Hx    Esophageal cancer Neg Hx    Rectal cancer Neg Hx    Stomach cancer Neg Hx     Social History   Socioeconomic History   Marital status: Single    Spouse name: Not on file   Number of children: 2   Years of education: Not on file   Highest education level: Not on file  Occupational History   Occupation: Fish farm manager in a Engineer, maintenance (IT)  Tobacco Use   Smoking status: Never   Smokeless tobacco: Never  Vaping Use   Vaping Use: Never used  Substance and Sexual Activity   Alcohol use: No    Alcohol/week: 0.0 standard drinks of alcohol   Drug use: No   Sexual activity: Not on file  Other Topics Concern   Not on file  Social History Narrative   Not on file   Social Determinants of Health   Financial Resource Strain: Not on file  Food Insecurity: No Food  Insecurity (12/06/2022)   Hunger Vital Sign    Worried About Running Out of Food in the Last Year: Never true    Ran Out of Food in the Last Year: Never true  Transportation Needs: No Transportation Needs (12/06/2022)   PRAPARE - Administrator, Civil Service (Medical): No    Lack of Transportation (Non-Medical): No  Physical Activity: Not on file  Stress: Not on file  Social Connections: Not on file  Intimate Partner Violence: Not At Risk (12/06/2022)   Humiliation, Afraid, Rape, and  Kick questionnaire    Fear of Current or Ex-Partner: No    Emotionally Abused: No    Physically Abused: No    Sexually Abused: No    Review of Systems:  All other review of systems negative except as mentioned in the HPI.  Physical Exam: Vital signs in last 24 hours: Blood Pressure (Abnormal) 157/87   Pulse 69   Temperature (Abnormal) 97.1 F (36.2 C)   Respiration 15   Height 5\' 3"  (1.6 m)   Weight 158 lb (71.7 kg)   Oxygen Saturation 98%   Body Mass Index 27.99 kg/m  General:   Alert, NAD Lungs:  Clear .   Heart:  Regular rate and rhythm Abdomen:  Soft, nontender and nondistended. Neuro/Psych:  Alert and cooperative. Normal mood and affect. A and O x 3  Reviewed labs, radiology imaging, old records and pertinent past GI work up  Patient is appropriate for planned procedure(s) and anesthesia in an ambulatory setting   K. Scherry Ran , MD [phone]

## 2023-03-21 ENCOUNTER — Telehealth: Payer: Self-pay | Admitting: *Deleted

## 2023-03-21 NOTE — Telephone Encounter (Signed)
  Follow up Call-     03/17/2023   10:04 AM  Call back number  Post procedure Call Back phone  # [phone]  Permission to leave phone message Yes     Patient questions:  Do you have a fever, pain , or abdominal swelling? No. Pain Score  0 *  Have you tolerated food without any problems? Yes.    Have you been able to return to your normal activities? Yes.    Do you have any questions about your discharge instructions: Diet   No. Medications  No. Follow up visit  No.  Do you have questions or concerns about your Care? No.  Actions: * If pain score is 4 or above: No action needed, pain <4.

## 2023-04-06 ENCOUNTER — Encounter: Payer: Self-pay | Admitting: Gastroenterology

## 2023-04-14 DIAGNOSIS — M25661 Stiffness of right knee, not elsewhere classified: Secondary | ICD-10-CM | POA: Diagnosis not present

## 2023-04-17 ENCOUNTER — Encounter (HOSPITAL_BASED_OUTPATIENT_CLINIC_OR_DEPARTMENT_OTHER): Payer: Self-pay | Admitting: Orthopedic Surgery

## 2023-04-17 NOTE — H&P (Signed)
PREOPERATIVE H&P  Chief Complaint: ANKLYOSIS RIGHT KNEE  HPI: Lori Bennett is a 67 y.o. female who presents with a diagnosis of ANKLYOSIS RIGHT KNEE. Symptoms are rated as moderate to severe, and have been worsening.  This is significantly impairing activities of daily living.  She has elected for surgical management.   Past Medical History:  Diagnosis Date   Ankylosis of right knee    post op TKA 12-06-2022   Chronic knee pain after total replacement of right knee joint    Esophageal dysmotility 01/2021   s/p EGD w/ ballooh dilatation for dysphasia w/ choking sensation (admission in epic 02-07-2023)   Full dentures    HTN (hypertension)    hx previously followed by cardiologist ,  dr [place], Theron Arista note in epic 05-18-2017 for severe HTN   Hyperlipidemia    Nerve pain    due to nerve pain brought on by prior right knee surgery    Noncompliance with medications    OA (osteoarthritis)    Peripheral neuropathy    PONV (postoperative nausea and vomiting)    Type 2 diabetes mellitus (HCC)    does not check cbg at home   Past Surgical History:  Procedure Laterality Date   BALLOON DILATION N/A 02/09/2021   Procedure: BALLOON DILATION;  Surgeon: Napoleon Form, MD;  Location: [facility];  Service: Endoscopy;  Laterality: N/A;   COLONOSCOPY WITH PROPOFOL  03/17/2023   dr Lavon Paganini   CONVERSION TO TOTAL KNEE Right 12/06/2022   Procedure: CONVERSION TO TOTAL KNEE;  Surgeon: Sheral Apley, MD;  Location: [facility];  Service: Orthopedics;  Laterality: Right;   ESOPHAGOGASTRODUODENOSCOPY (EGD) WITH PROPOFOL N/A 02/09/2021   Procedure: ESOPHAGOGASTRODUODENOSCOPY (EGD) WITH PROPOFOL;  Surgeon: Napoleon Form, MD;  Location: [facility];  Service: Endoscopy;  Laterality: N/A;   PARTIAL KNEE ARTHROPLASTY Right 05/30/2018   @ [facility] by dr Eulah Pont   ROTATOR CUFF REPAIR Right    TOTAL KNEE ARTHROPLASTY Left 04/30/2019   Procedure: TOTAL KNEE ARTHROPLASTY;  Surgeon: Sheral Apley, MD;   Location: [facility];  Service: Orthopedics;  Laterality: Left;   TUBAL LIGATION Bilateral    yrs ago   Social History   Socioeconomic History   Marital status: Single    Spouse name: Not on file   Number of children: 2   Years of education: Not on file   Highest education level: Not on file  Occupational History   Occupation: Fish farm manager in a Engineer, maintenance (IT)  Tobacco Use   Smoking status: Never   Smokeless tobacco: Never  Vaping Use   Vaping Use: Never used  Substance and Sexual Activity   Alcohol use: No    Alcohol/week: 0.0 standard drinks of alcohol   Drug use: No   Sexual activity: Not on file  Other Topics Concern   Not on file  Social History Narrative   Not on file   Social Determinants of Health   Financial Resource Strain: Not on file  Food Insecurity: No Food Insecurity (12/06/2022)   Hunger Vital Sign    Worried About Running Out of Food in the Last Year: Never true    Ran Out of Food in the Last Year: Never true  Transportation Needs: No Transportation Needs (12/06/2022)   PRAPARE - Administrator, Civil Service (Medical): No    Lack of Transportation (Non-Medical): No  Physical Activity: Not on file  Stress: Not on file  Social Connections: Not on file   Family History  Problem Relation Age of Onset   Hypertension Mother    Colon cancer Neg Hx    Esophageal cancer Neg Hx    Rectal cancer Neg Hx    Stomach cancer Neg Hx    Allergies  Allergen Reactions   Ace Inhibitors Cough   Clonidine Derivatives Other (See Comments)    Severe dry mouth and lethargy.   Prior to Admission medications   Medication Sig Start Date End Date Taking? Authorizing Provider  acetaminophen (TYLENOL) 500 MG tablet Take 2 tablets (1,000 mg total) by mouth every 6 (six) hours as needed for mild pain or moderate pain. 12/08/22   Jenne Pane, PA-C  amLODipine (NORVASC) 5 MG tablet Take 1 tablet (5 mg total) by mouth 2 (two) times daily. NEED OV. Patient taking  differently: Take 5 mg by mouth daily. 06/18/18   [place], Peter M, MD  atorvastatin (LIPITOR) 20 MG tablet Take 20 mg by mouth at bedtime.    [provider]  carvedilol (COREG) 12.5 MG tablet Take 1 tablet (12.5 mg total) by mouth 2 (two) times daily with a meal. NEED OV. Patient taking differently: Take 12.5 mg by mouth 2 (two) times daily with a meal. 06/18/18   [place], Peter M, MD  Dulaglutide 1.5 MG/0.5ML SOPN Inject 1.5 mg into the skin every 7 (seven) days.    [provider]  gabapentin (NEURONTIN) 300 MG capsule Take 300 mg by mouth at bedtime as needed (pain). 09/27/22   [provider]  irbesartan (AVAPRO) 150 MG tablet Take 150 mg by mouth daily. 09/27/22   [provider]  meloxicam (MOBIC) 15 MG tablet Take 15 mg by mouth daily as needed for pain.    [provider]  methocarbamol (ROBAXIN-750) 750 MG tablet Take 1 tablet (750 mg total) by mouth every 8 (eight) hours as needed for muscle spasms. 12/08/22   Jenne Pane, PA-C     Positive ROS: All other systems have been reviewed and were otherwise negative with the exception of those mentioned in the HPI and as above.  Physical Exam: General: Alert, no acute distress Cardiovascular: No pedal edema Respiratory: No cyanosis, no use of accessory musculature GI: No organomegaly, abdomen is soft and non-tender Skin: No lesions in the area of chief complaint Neurologic: Sensation intact distally Psychiatric: Patient is competent for consent with normal mood and affect Lymphatic: No axillary or cervical lymphadenopathy  MUSCULOSKELETAL: TTP right knee, limited ROM 10-95 degrees, strength decreased, incision well healed, mild effusion present, NVI   Imaging: n/a   Assessment: ANKLYOSIS RIGHT KNEE  Plan: Plan for Procedure(s): CLOSED MANIPULATION KNEE  The risks benefits and alternatives were discussed with the patient including but not limited to the risks of nonoperative  treatment, versus surgical intervention including infection, bleeding, nerve injury,  blood clots, cardiopulmonary complications, morbidity, mortality, among others, and they were willing to proceed.   Weightbearing: WBAT Orthopedic devices: none Showering: normal Dressing: none Medicines: dose pack  Discharge: home Follow up: 05/05/23 at 11:30am    Marzetta Board Office [phone] 04/17/2023 4:34 PM

## 2023-04-18 ENCOUNTER — Encounter (HOSPITAL_BASED_OUTPATIENT_CLINIC_OR_DEPARTMENT_OTHER): Payer: Self-pay | Admitting: Orthopedic Surgery

## 2023-04-18 NOTE — Progress Notes (Signed)
Spoke w/ via phone for pre-op interview--- pt Lab needs dos----  [facility] results------ current EKG in epic/ chart COVID test -----patient states asymptomatic no test needed Arrive at ------- 1000 on 04-25-2023 NPO after MN NO Solid Food.  Clear liquids from MN until--- 0900 Med rec completed Medications to take morning of surgery ----- lipitor, coreg, norvasc Diabetic medication ----- pt last dose for trulicity is today actually 7 days prior to surgery,  pt was given instructions from office  Patient instructed no nail polish to be worn day of surgery Patient instructed to bring photo id and insurance card day of surgery Patient aware to have Driver (ride ) / caregiver    for 24 hours after surgery -- sister, sheron Patient Special Instructions ----- n/a Pre-Op special Instructions ----- n/a Patient verbalized understanding of instructions that were given at this phone interview. Patient denies shortness of breath, chest pain, fever, cough at this phone interview.

## 2023-04-25 ENCOUNTER — Other Ambulatory Visit: Payer: Self-pay

## 2023-04-25 ENCOUNTER — Ambulatory Visit (HOSPITAL_BASED_OUTPATIENT_CLINIC_OR_DEPARTMENT_OTHER): Payer: Medicare HMO | Admitting: Anesthesiology

## 2023-04-25 ENCOUNTER — Ambulatory Visit (HOSPITAL_BASED_OUTPATIENT_CLINIC_OR_DEPARTMENT_OTHER)
Admission: RE | Admit: 2023-04-25 | Discharge: 2023-04-25 | Disposition: A | Discharge status: Home / Self Care | Payer: Medicare HMO | Source: Ambulatory Visit | Attending: Orthopedic Surgery | Admitting: Orthopedic Surgery

## 2023-04-25 ENCOUNTER — Encounter (HOSPITAL_BASED_OUTPATIENT_CLINIC_OR_DEPARTMENT_OTHER): Payer: Self-pay | Admitting: Orthopedic Surgery

## 2023-04-25 ENCOUNTER — Encounter (HOSPITAL_BASED_OUTPATIENT_CLINIC_OR_DEPARTMENT_OTHER)
Admission: RE | Disposition: A | Discharge status: Home / Self Care | Payer: Self-pay | Source: Ambulatory Visit | Attending: Orthopedic Surgery

## 2023-04-25 DIAGNOSIS — Z7985 Long-term (current) use of injectable non-insulin antidiabetic drugs: Secondary | ICD-10-CM | POA: Diagnosis not present

## 2023-04-25 DIAGNOSIS — Z7984 Long term (current) use of oral hypoglycemic drugs: Secondary | ICD-10-CM | POA: Diagnosis not present

## 2023-04-25 DIAGNOSIS — M24661 Ankylosis, right knee: Secondary | ICD-10-CM | POA: Diagnosis not present

## 2023-04-25 DIAGNOSIS — M1990 Unspecified osteoarthritis, unspecified site: Secondary | ICD-10-CM | POA: Insufficient documentation

## 2023-04-25 DIAGNOSIS — Z79899 Other long term (current) drug therapy: Secondary | ICD-10-CM | POA: Insufficient documentation

## 2023-04-25 DIAGNOSIS — Z96651 Presence of right artificial knee joint: Secondary | ICD-10-CM

## 2023-04-25 DIAGNOSIS — E1142 Type 2 diabetes mellitus with diabetic polyneuropathy: Secondary | ICD-10-CM | POA: Diagnosis not present

## 2023-04-25 DIAGNOSIS — I10 Essential (primary) hypertension: Secondary | ICD-10-CM | POA: Insufficient documentation

## 2023-04-25 DIAGNOSIS — Z01818 Encounter for other preprocedural examination: Secondary | ICD-10-CM

## 2023-04-25 HISTORY — DX: Type 2 diabetes mellitus without complications: E11.9

## 2023-04-25 HISTORY — DX: Ankylosis, right knee: M24.661

## 2023-04-25 HISTORY — DX: Hyperlipidemia, unspecified: E78.5

## 2023-04-25 HISTORY — DX: Polyneuropathy, unspecified: G62.9

## 2023-04-25 HISTORY — DX: Presence of dental prosthetic device (complete) (partial): K08.109

## 2023-04-25 HISTORY — DX: Other chronic pain: G89.29

## 2023-04-25 HISTORY — DX: Unspecified osteoarthritis, unspecified site: M19.90

## 2023-04-25 HISTORY — PX: KNEE CLOSED REDUCTION: SHX995

## 2023-04-25 LAB — POCT I-STAT, CHEM 8
BUN: 5 mg/dL — ABNORMAL LOW (ref 8–23)
Calcium, Ion: 1.24 mmol/L (ref 1.15–1.40)
Chloride: 104 mmol/L (ref 98–111)
Creatinine, Ser: 0.6 mg/dL (ref 0.44–1.00)
Glucose, Bld: 142 mg/dL — ABNORMAL HIGH (ref 70–99)
HCT: 38 % (ref 36.0–46.0)
Hemoglobin: 12.9 g/dL (ref 12.0–15.0)
Potassium: 3 mmol/L — ABNORMAL LOW (ref 3.5–5.1)
Sodium: 144 mmol/L (ref 135–145)
TCO2: 27 mmol/L (ref 22–32)

## 2023-04-25 LAB — GLUCOSE, CAPILLARY: Glucose-Capillary: 124 mg/dL — ABNORMAL HIGH (ref 70–99)

## 2023-04-25 SURGERY — MANIPULATION, KNEE, CLOSED
Anesthesia: General | Site: Knee | Laterality: Right

## 2023-04-25 MED ORDER — LACTATED RINGERS IV SOLN: INTRAVENOUS | Status: DC

## 2023-04-25 MED ORDER — HYDROMORPHONE HCL 1 MG/ML IJ SOLN
INTRAMUSCULAR | Status: AC
  Filled 2023-04-25: qty 1

## 2023-04-25 MED ORDER — FENTANYL CITRATE (PF) 100 MCG/2ML IJ SOLN
INTRAMUSCULAR | Status: DC | PRN
  Administered 2023-04-25: 50 ug via INTRAVENOUS

## 2023-04-25 MED ORDER — OXYCODONE HCL 5 MG PO TABS: 5.0000 mg | ORAL_TABLET | Freq: Once | ORAL | Status: DC | PRN

## 2023-04-25 MED ORDER — ACETAMINOPHEN 500 MG PO TABS
1000.0000 mg | ORAL_TABLET | Freq: Once | ORAL | Status: AC
  Administered 2023-04-25: 1000 mg via ORAL

## 2023-04-25 MED ORDER — MIDAZOLAM HCL 5 MG/5ML IJ SOLN
INTRAMUSCULAR | Status: DC | PRN
  Administered 2023-04-25: 1 mg via INTRAVENOUS

## 2023-04-25 MED ORDER — FENTANYL CITRATE (PF) 100 MCG/2ML IJ SOLN
INTRAMUSCULAR | Status: AC
  Filled 2023-04-25: qty 2

## 2023-04-25 MED ORDER — OXYCODONE HCL 5 MG/5ML PO SOLN: 5.0000 mg | Freq: Once | ORAL | Status: DC | PRN

## 2023-04-25 MED ORDER — PROPOFOL 10 MG/ML IV BOLUS
INTRAVENOUS | Status: DC | PRN
  Administered 2023-04-25: 150 mg via INTRAVENOUS

## 2023-04-25 MED ORDER — ONDANSETRON HCL 4 MG/2ML IJ SOLN
INTRAMUSCULAR | Status: DC | PRN
  Administered 2023-04-25: 4 mg via INTRAVENOUS

## 2023-04-25 MED ORDER — AMISULPRIDE (ANTIEMETIC) 5 MG/2ML IV SOLN: 10.0000 mg | Freq: Once | INTRAVENOUS | Status: DC | PRN

## 2023-04-25 MED ORDER — MIDAZOLAM HCL 2 MG/2ML IJ SOLN
INTRAMUSCULAR | Status: AC
  Filled 2023-04-25: qty 2

## 2023-04-25 MED ORDER — DEXAMETHASONE SODIUM PHOSPHATE 10 MG/ML IJ SOLN
8.0000 mg | Freq: Once | INTRAMUSCULAR | Status: AC
  Administered 2023-04-25: 5 mg via INTRAVENOUS

## 2023-04-25 MED ORDER — MIDAZOLAM HCL 2 MG/2ML IJ SOLN: 2.0000 mg | Freq: Once | INTRAMUSCULAR | Status: DC

## 2023-04-25 MED ORDER — LIDOCAINE 2% (20 MG/ML) 5 ML SYRINGE
INTRAMUSCULAR | Status: DC | PRN
  Administered 2023-04-25: 60 mg via INTRAVENOUS

## 2023-04-25 MED ORDER — POVIDONE-IODINE 10 % EX SWAB: 2.0000 | Freq: Once | CUTANEOUS | Status: DC

## 2023-04-25 MED ORDER — PROPOFOL 10 MG/ML IV BOLUS
INTRAVENOUS | Status: AC
  Filled 2023-04-25: qty 20

## 2023-04-25 MED ORDER — ACETAMINOPHEN 500 MG PO TABS
ORAL_TABLET | ORAL | Status: AC
  Filled 2023-04-25: qty 2

## 2023-04-25 MED ORDER — HYDROMORPHONE HCL 1 MG/ML IJ SOLN
0.2500 mg | INTRAMUSCULAR | Status: DC | PRN
  Administered 2023-04-25 (×3): 0.5 mg via INTRAVENOUS

## 2023-04-25 MED ORDER — FENTANYL CITRATE (PF) 100 MCG/2ML IJ SOLN: 100.0000 ug | Freq: Once | INTRAMUSCULAR | Status: DC

## 2023-04-25 MED ORDER — METHYLPREDNISOLONE 4 MG PO TBPK: ORAL_TABLET | ORAL | 0 refills | Status: DC

## 2023-04-25 SURGICAL SUPPLY — 1 items: KIT TURNOVER CYSTO (KITS) ×2 IMPLANT

## 2023-04-25 NOTE — Anesthesia Procedure Notes (Signed)
Procedure Name: General with mask airway Date/Time: 04/25/2023 11:51 AM  Performed by: Bishop Limbo, CRNAPre-anesthesia Checklist: Patient identified, Emergency Drugs available, Suction available and Patient being monitored Patient Re-evaluated:Patient Re-evaluated prior to induction Oxygen Delivery Method: Circle System Utilized Preoxygenation: Pre-oxygenation with 100% oxygen Induction Type: IV induction Ventilation: Mask ventilation without difficulty and Oral airway inserted - appropriate to patient size Placement Confirmation: positive ETCO2 Dental Injury: Teeth and Oropharynx as per pre-operative assessment

## 2023-04-25 NOTE — Discharge Instructions (Addendum)
  Post Anesthesia Home Care Instructions  Activity: Get plenty of rest for the remainder of the day. A responsible individual must stay with you for 24 hours following the procedure.  For the next 24 hours, DO NOT: -Drive a car -Advertising copywriter -Drink alcoholic beverages -Take any medication unless instructed by your physician -Make any legal decisions or sign important papers.  Meals: Start with liquid foods such as gelatin or soup. Progress to regular foods as tolerated. Avoid greasy, spicy, heavy foods. If nausea and/or vomiting occur, drink only clear liquids until the nausea and/or vomiting subsides. Call your physician if vomiting continues.  Special Instructions/Symptoms: Your throat may feel dry or sore from the anesthesia or the breathing tube placed in your throat during surgery. If this causes discomfort, gargle with warm salt water. The discomfort should disappear within 24 hours.  Call your surgeon if you experience:   1.  Fever over 101.0. 2.  Inability to urinate. 3.  Nausea and/or vomiting. 4.  Extreme swelling or bruising at the surgical site. 5.  Continued bleeding from the incision. 6.  Increased pain, redness or drainage from the incision. 7.  Problems related to your pain medication. 8. Any change in color, movement and/or sensation 9. Any problems and/or concerns    May take Tylenol after  4:15 pm today if needed

## 2023-04-25 NOTE — Interval H&P Note (Signed)
History and Physical Interval Note:  04/25/2023 10:27 AM  Lori Bennett  has presented today for surgery, with the diagnosis of ANKLYOSIS RIGHT KNEE.  The various methods of treatment have been discussed with the patient and family. After consideration of risks, benefits and other options for treatment, the patient has consented to  Procedure(s): CLOSED MANIPULATION KNEE (Right) as a surgical intervention.  The patient's history has been reviewed, patient examined, no change in status, stable for surgery.  I have reviewed the patient's chart and labs.  Questions were answered to the patient's satisfaction.     Sheral Apley

## 2023-04-25 NOTE — Anesthesia Preprocedure Evaluation (Addendum)
Anesthesia Evaluation  Patient identified by MRN, date of birth, ID band Patient awake    Reviewed: Allergy & Precautions, NPO status , Patient's Chart, lab work & pertinent test results  History of Anesthesia Complications (+) PONV and history of anesthetic complications  Airway Mallampati: II  TM Distance: >3 FB Neck ROM: Full    Dental  (+) Edentulous Lower, Edentulous Upper   Pulmonary neg pulmonary ROS   Pulmonary exam normal breath sounds clear to auscultation       Cardiovascular hypertension, Pt. on home beta blockers Normal cardiovascular exam Rhythm:Regular Rate:Normal  Echo 01/2021  1. Left ventricular ejection fraction, by estimation, is 60 to 65%. The left ventricle has normal function. The left ventricle has no regional wall motion abnormalities. Left ventricular diastolic parameters are consistent with Grade I diastolic dysfunction (impaired relaxation).   2. Right ventricular systolic function is normal. The right ventricular size is normal.   3. The mitral valve is normal in structure. No evidence of mitral valve regurgitation. No evidence of mitral stenosis.   4. The aortic valve is tricuspid. Aortic valve regurgitation is not visualized. No aortic stenosis is present.   5. The inferior vena cava is normal in size with greater than 50% respiratory variability, suggesting right atrial pressure of 3 mmHg.   Comparison(s): No prior Echocardiogram.      Neuro/Psych negative neurological ROS     GI/Hepatic negative GI ROS, Neg liver ROS,,,  Endo/Other  diabetes, Type 2, Oral Hypoglycemic Agents    Renal/GU negative Renal ROS     Musculoskeletal  (+) Arthritis ,    Abdominal   Peds  Hematology negative hematology ROS (+)   Anesthesia Other Findings Day of surgery medications reviewed with patient.  Reproductive/Obstetrics                             Anesthesia  Physical Anesthesia Plan  ASA: 2  Anesthesia Plan: General   Post-op Pain Management: Regional block*, Tylenol PO (pre-op)* and Toradol IV (intra-op)*   Induction: Intravenous  PONV Risk Score and Plan: 4 or greater and Treatment may vary due to age or medical condition, Ondansetron, Dexamethasone and Midazolam  Airway Management Planned: Mask and LMA  Additional Equipment: None  Intra-op Plan:   Post-operative Plan:   Informed Consent: I have reviewed the patients History and Physical, chart, labs and discussed the procedure including the risks, benefits and alternatives for the proposed anesthesia with the patient or authorized representative who has indicated his/her understanding and acceptance.     Dental advisory given  Plan Discussed with: CRNA  Anesthesia Plan Comments:        Anesthesia Quick Evaluation

## 2023-04-25 NOTE — Op Note (Signed)
04/25/2023  12:12 PM  PATIENT:  Lori Bennett    PRE-OPERATIVE DIAGNOSIS:  ANKLYOSIS RIGHT KNEE  POST-OPERATIVE DIAGNOSIS:  Same  PROCEDURE:  CLOSED MANIPULATION KNEE  SURGEON:  Sheral Apley, MD  ASSISTANT: Levester Fresh, PA-C, he was present and scrubbed throughout the case, critical for completion in a timely fashion, and for retraction, instrumentation, and closure.   ANESTHESIA:   mask  PREOPERATIVE INDICATIONS:  [name] is a  67 y.o. female with a diagnosis of ANKLYOSIS RIGHT KNEE who failed conservative measures and elected for surgical management.    The risks benefits and alternatives were discussed with the patient preoperatively including but not limited to the risks of infection, bleeding, nerve injury, cardiopulmonary complications, the need for revision surgery, among others, and the patient was willing to proceed.  OPERATIVE FINDINGS: improved to 140 degrees of flexion  COMPLICATIONS: none  OPERATIVE PROCEDURE:  Patient was identified in the preoperative holding area and site was marked by me She was transported to the operating theater and placed on the table in supine position taking care to pad all bony prominences. After a preincinduction time out anesthesia was induced.   Preoperative antibiotics were not indicated  I performed a firm manipulation of her knee she had good extension she improved to 140/150 degrees of flexion.  POST OPERATIVE PLAN: Aggressive physical therapy

## 2023-04-25 NOTE — Transfer of Care (Signed)
Immediate Anesthesia Transfer of Care Note  Patient: Lori Bennett  Procedure(s) Performed: CLOSED MANIPULATION KNEE (Right: Knee)  Patient Location: PACU  Anesthesia Type:General  Level of Consciousness: awake, alert , oriented, and patient cooperative  Airway & Oxygen Therapy: Patient Spontanous Breathing and Patient connected to face mask oxygen  Post-op Assessment: Report given to RN and Post -op Vital signs reviewed and stable  Post vital signs: Reviewed and stable  Last Vitals:  Vitals Value Taken Time  BP 155/86 04/25/23 1208  Temp    Pulse 87 04/25/23 1209  Resp 21 04/25/23 1209  SpO2 100 % 04/25/23 1209  Vitals shown include unvalidated device data.  Last Pain:  Vitals:   04/25/23 1011  TempSrc: Oral  PainSc: 0-No pain      Patients Stated Pain Goal: 6 (04/25/23 1011)  Complications: No notable events documented.

## 2023-04-26 ENCOUNTER — Encounter (HOSPITAL_BASED_OUTPATIENT_CLINIC_OR_DEPARTMENT_OTHER): Payer: Self-pay | Admitting: Orthopedic Surgery

## 2023-04-26 DIAGNOSIS — Z96651 Presence of right artificial knee joint: Secondary | ICD-10-CM | POA: Diagnosis not present

## 2023-04-26 DIAGNOSIS — M6281 Muscle weakness (generalized): Secondary | ICD-10-CM | POA: Diagnosis not present

## 2023-04-26 DIAGNOSIS — M25661 Stiffness of right knee, not elsewhere classified: Secondary | ICD-10-CM | POA: Diagnosis not present

## 2023-04-26 DIAGNOSIS — R262 Difficulty in walking, not elsewhere classified: Secondary | ICD-10-CM | POA: Diagnosis not present

## 2023-04-28 DIAGNOSIS — R262 Difficulty in walking, not elsewhere classified: Secondary | ICD-10-CM | POA: Diagnosis not present

## 2023-04-28 DIAGNOSIS — M25661 Stiffness of right knee, not elsewhere classified: Secondary | ICD-10-CM | POA: Diagnosis not present

## 2023-04-28 DIAGNOSIS — M6281 Muscle weakness (generalized): Secondary | ICD-10-CM | POA: Diagnosis not present

## 2023-04-28 DIAGNOSIS — Z96651 Presence of right artificial knee joint: Secondary | ICD-10-CM | POA: Diagnosis not present

## 2023-04-28 NOTE — Anesthesia Postprocedure Evaluation (Signed)
Anesthesia Post Note  Patient: Lori Bennett  Procedure(s) Performed: CLOSED MANIPULATION KNEE (Right: Knee)     Patient location during evaluation: PACU Anesthesia Type: General Level of consciousness: sedated and patient cooperative Pain management: pain level controlled Vital Signs Assessment: post-procedure vital signs reviewed and stable Respiratory status: spontaneous breathing Cardiovascular status: stable Anesthetic complications: no   No notable events documented.  Last Vitals:  Vitals:   04/25/23 1330 04/25/23 1420  BP: 135/74 (!) 155/84  Pulse: 79 68  Resp: 14 15  Temp:  (!) 36.3 C  SpO2: 99% 95%    Last Pain:  Vitals:   04/26/23 1320  TempSrc:   PainSc: 0-No pain                 Lewie Loron

## 2023-05-01 DIAGNOSIS — M25661 Stiffness of right knee, not elsewhere classified: Secondary | ICD-10-CM | POA: Diagnosis not present

## 2023-05-01 DIAGNOSIS — Z96651 Presence of right artificial knee joint: Secondary | ICD-10-CM | POA: Diagnosis not present

## 2023-05-01 DIAGNOSIS — M6281 Muscle weakness (generalized): Secondary | ICD-10-CM | POA: Diagnosis not present

## 2023-05-01 DIAGNOSIS — R262 Difficulty in walking, not elsewhere classified: Secondary | ICD-10-CM | POA: Diagnosis not present

## 2023-05-02 DIAGNOSIS — E1142 Type 2 diabetes mellitus with diabetic polyneuropathy: Secondary | ICD-10-CM | POA: Diagnosis not present

## 2023-05-02 DIAGNOSIS — I10 Essential (primary) hypertension: Secondary | ICD-10-CM | POA: Diagnosis not present

## 2023-05-02 DIAGNOSIS — E785 Hyperlipidemia, unspecified: Secondary | ICD-10-CM | POA: Diagnosis not present

## 2023-05-03 DIAGNOSIS — R262 Difficulty in walking, not elsewhere classified: Secondary | ICD-10-CM | POA: Diagnosis not present

## 2023-05-03 DIAGNOSIS — M6281 Muscle weakness (generalized): Secondary | ICD-10-CM | POA: Diagnosis not present

## 2023-05-03 DIAGNOSIS — Z96651 Presence of right artificial knee joint: Secondary | ICD-10-CM | POA: Diagnosis not present

## 2023-05-03 DIAGNOSIS — M25661 Stiffness of right knee, not elsewhere classified: Secondary | ICD-10-CM | POA: Diagnosis not present

## 2023-05-05 DIAGNOSIS — Z96651 Presence of right artificial knee joint: Secondary | ICD-10-CM | POA: Diagnosis not present

## 2023-05-05 DIAGNOSIS — M1711 Unilateral primary osteoarthritis, right knee: Secondary | ICD-10-CM | POA: Diagnosis not present

## 2023-05-05 DIAGNOSIS — M25661 Stiffness of right knee, not elsewhere classified: Secondary | ICD-10-CM | POA: Diagnosis not present

## 2023-05-05 DIAGNOSIS — M24661 Ankylosis, right knee: Secondary | ICD-10-CM | POA: Diagnosis not present

## 2023-05-05 DIAGNOSIS — M6281 Muscle weakness (generalized): Secondary | ICD-10-CM | POA: Diagnosis not present

## 2023-05-05 DIAGNOSIS — R262 Difficulty in walking, not elsewhere classified: Secondary | ICD-10-CM | POA: Diagnosis not present

## 2023-06-28 DIAGNOSIS — E1142 Type 2 diabetes mellitus with diabetic polyneuropathy: Secondary | ICD-10-CM | POA: Diagnosis not present

## 2023-06-28 DIAGNOSIS — E785 Hyperlipidemia, unspecified: Secondary | ICD-10-CM | POA: Diagnosis not present

## 2023-06-28 DIAGNOSIS — I10 Essential (primary) hypertension: Secondary | ICD-10-CM | POA: Diagnosis not present

## 2023-08-21 ENCOUNTER — Other Ambulatory Visit: Payer: Self-pay | Admitting: Internal Medicine

## 2023-08-21 DIAGNOSIS — Z0000 Encounter for general adult medical examination without abnormal findings: Secondary | ICD-10-CM

## 2023-08-24 ENCOUNTER — Ambulatory Visit
Admission: RE | Admit: 2023-08-24 | Discharge: 2023-08-24 | Disposition: A | Discharge status: Home / Self Care | Payer: Medicare HMO | Source: Ambulatory Visit | Attending: Internal Medicine | Admitting: Internal Medicine

## 2023-08-24 DIAGNOSIS — Z1231 Encounter for screening mammogram for malignant neoplasm of breast: Secondary | ICD-10-CM | POA: Diagnosis not present

## 2023-08-24 DIAGNOSIS — Z0000 Encounter for general adult medical examination without abnormal findings: Secondary | ICD-10-CM

## 2023-10-02 ENCOUNTER — Inpatient Hospital Stay (HOSPITAL_COMMUNITY)
Admission: EM | Admit: 2023-10-02 | Discharge: 2023-10-05 | DRG: 872 | Disposition: A | Discharge status: Home / Self Care | Payer: Medicare HMO | Attending: Internal Medicine | Admitting: Internal Medicine

## 2023-10-02 ENCOUNTER — Other Ambulatory Visit: Payer: Self-pay

## 2023-10-02 ENCOUNTER — Emergency Department (HOSPITAL_COMMUNITY): Payer: PRIVATE HEALTH INSURANCE

## 2023-10-02 ENCOUNTER — Encounter (HOSPITAL_COMMUNITY): Payer: Self-pay | Admitting: Emergency Medicine

## 2023-10-02 DIAGNOSIS — E876 Hypokalemia: Secondary | ICD-10-CM | POA: Diagnosis not present

## 2023-10-02 DIAGNOSIS — Z8249 Family history of ischemic heart disease and other diseases of the circulatory system: Secondary | ICD-10-CM

## 2023-10-02 DIAGNOSIS — I5032 Chronic diastolic (congestive) heart failure: Secondary | ICD-10-CM | POA: Diagnosis present

## 2023-10-02 DIAGNOSIS — N12 Tubulo-interstitial nephritis, not specified as acute or chronic: Secondary | ICD-10-CM | POA: Diagnosis not present

## 2023-10-02 DIAGNOSIS — N39498 Other specified urinary incontinence: Secondary | ICD-10-CM | POA: Diagnosis not present

## 2023-10-02 DIAGNOSIS — R1031 Right lower quadrant pain: Secondary | ICD-10-CM | POA: Diagnosis not present

## 2023-10-02 DIAGNOSIS — M1990 Unspecified osteoarthritis, unspecified site: Secondary | ICD-10-CM | POA: Diagnosis not present

## 2023-10-02 DIAGNOSIS — N179 Acute kidney failure, unspecified: Secondary | ICD-10-CM | POA: Diagnosis present

## 2023-10-02 DIAGNOSIS — R6520 Severe sepsis without septic shock: Secondary | ICD-10-CM | POA: Diagnosis present

## 2023-10-02 DIAGNOSIS — E1169 Type 2 diabetes mellitus with other specified complication: Secondary | ICD-10-CM | POA: Diagnosis present

## 2023-10-02 DIAGNOSIS — E119 Type 2 diabetes mellitus without complications: Secondary | ICD-10-CM

## 2023-10-02 DIAGNOSIS — E1159 Type 2 diabetes mellitus with other circulatory complications: Secondary | ICD-10-CM | POA: Diagnosis present

## 2023-10-02 DIAGNOSIS — Z1611 Resistance to penicillins: Secondary | ICD-10-CM | POA: Diagnosis present

## 2023-10-02 DIAGNOSIS — I10 Essential (primary) hypertension: Secondary | ICD-10-CM | POA: Diagnosis present

## 2023-10-02 DIAGNOSIS — A4151 Sepsis due to Escherichia coli [E. coli]: Secondary | ICD-10-CM | POA: Diagnosis not present

## 2023-10-02 DIAGNOSIS — Z96653 Presence of artificial knee joint, bilateral: Secondary | ICD-10-CM | POA: Diagnosis present

## 2023-10-02 DIAGNOSIS — N2889 Other specified disorders of kidney and ureter: Secondary | ICD-10-CM | POA: Diagnosis not present

## 2023-10-02 DIAGNOSIS — K429 Umbilical hernia without obstruction or gangrene: Secondary | ICD-10-CM | POA: Diagnosis present

## 2023-10-02 DIAGNOSIS — A419 Sepsis, unspecified organism: Secondary | ICD-10-CM | POA: Diagnosis not present

## 2023-10-02 DIAGNOSIS — N390 Urinary tract infection, site not specified: Secondary | ICD-10-CM | POA: Diagnosis not present

## 2023-10-02 DIAGNOSIS — R110 Nausea: Secondary | ICD-10-CM | POA: Diagnosis not present

## 2023-10-02 DIAGNOSIS — I959 Hypotension, unspecified: Secondary | ICD-10-CM | POA: Diagnosis present

## 2023-10-02 DIAGNOSIS — I517 Cardiomegaly: Secondary | ICD-10-CM | POA: Diagnosis not present

## 2023-10-02 DIAGNOSIS — K5730 Diverticulosis of large intestine without perforation or abscess without bleeding: Secondary | ICD-10-CM | POA: Diagnosis present

## 2023-10-02 DIAGNOSIS — R748 Abnormal levels of other serum enzymes: Secondary | ICD-10-CM | POA: Diagnosis not present

## 2023-10-02 DIAGNOSIS — E785 Hyperlipidemia, unspecified: Secondary | ICD-10-CM | POA: Diagnosis present

## 2023-10-02 DIAGNOSIS — E1142 Type 2 diabetes mellitus with diabetic polyneuropathy: Secondary | ICD-10-CM | POA: Diagnosis not present

## 2023-10-02 DIAGNOSIS — I152 Hypertension secondary to endocrine disorders: Secondary | ICD-10-CM | POA: Diagnosis present

## 2023-10-02 DIAGNOSIS — Z79899 Other long term (current) drug therapy: Secondary | ICD-10-CM

## 2023-10-02 DIAGNOSIS — D72825 Bandemia: Secondary | ICD-10-CM | POA: Diagnosis not present

## 2023-10-02 DIAGNOSIS — E1165 Type 2 diabetes mellitus with hyperglycemia: Secondary | ICD-10-CM | POA: Diagnosis present

## 2023-10-02 DIAGNOSIS — R509 Fever, unspecified: Secondary | ICD-10-CM | POA: Diagnosis not present

## 2023-10-02 DIAGNOSIS — B9620 Unspecified Escherichia coli [E. coli] as the cause of diseases classified elsewhere: Secondary | ICD-10-CM | POA: Diagnosis not present

## 2023-10-02 LAB — RESP PANEL BY RT-PCR (RSV, FLU A&B, COVID)  RVPGX2
Influenza A by PCR: NEGATIVE
Influenza B by PCR: NEGATIVE
Resp Syncytial Virus by PCR: NEGATIVE
SARS Coronavirus 2 by RT PCR: NEGATIVE

## 2023-10-02 LAB — CBG MONITORING, ED
Glucose-Capillary: 188 mg/dL — ABNORMAL HIGH (ref 70–99)
Glucose-Capillary: 206 mg/dL — ABNORMAL HIGH (ref 70–99)

## 2023-10-02 LAB — LIPASE, BLOOD: Lipase: 45 U/L (ref 11–51)

## 2023-10-02 LAB — CBC WITH DIFFERENTIAL/PLATELET
Abs Immature Granulocytes: 0.33 10*3/uL — ABNORMAL HIGH (ref 0.00–0.07)
Basophils Absolute: 0.1 10*3/uL (ref 0.0–0.1)
Basophils Relative: 0 %
Eosinophils Absolute: 0.1 10*3/uL (ref 0.0–0.5)
Eosinophils Relative: 0 %
HCT: 36 % (ref 36.0–46.0)
Hemoglobin: 12.1 g/dL (ref 12.0–15.0)
Immature Granulocytes: 2 %
Lymphocytes Relative: 11 %
Lymphs Abs: 2 10*3/uL (ref 0.7–4.0)
MCH: 30.3 pg (ref 26.0–34.0)
MCHC: 33.6 g/dL (ref 30.0–36.0)
MCV: 90 fL (ref 80.0–100.0)
Monocytes Absolute: 1.5 10*3/uL — ABNORMAL HIGH (ref 0.1–1.0)
Monocytes Relative: 8 %
Neutro Abs: 15.2 10*3/uL — ABNORMAL HIGH (ref 1.7–7.7)
Neutrophils Relative %: 79 %
Platelets: 220 10*3/uL (ref 150–400)
RBC: 4 MIL/uL (ref 3.87–5.11)
RDW: 13.6 % (ref 11.5–15.5)
WBC: 19.1 10*3/uL — ABNORMAL HIGH (ref 4.0–10.5)
nRBC: 0 % (ref 0.0–0.2)

## 2023-10-02 LAB — URINALYSIS, W/ REFLEX TO CULTURE (INFECTION SUSPECTED)
Bilirubin Urine: NEGATIVE
Glucose, UA: >=500 mg/dL — AB
Ketones, ur: NEGATIVE mg/dL
Nitrite: NEGATIVE
Protein, ur: 100 mg/dL — AB
Specific Gravity, Urine: 1.016 (ref 1.005–1.030)
WBC, UA: >50 WBC/hpf (ref 0–5)
pH: 5 (ref 5.0–8.0)

## 2023-10-02 LAB — COMPREHENSIVE METABOLIC PANEL
ALT: 75 U/L — ABNORMAL HIGH (ref 0–44)
AST: 67 U/L — ABNORMAL HIGH (ref 15–41)
Albumin: 3.3 g/dL — ABNORMAL LOW (ref 3.5–5.0)
Alkaline Phosphatase: 204 U/L — ABNORMAL HIGH (ref 38–126)
Anion gap: 12 (ref 5–15)
BUN: 27 mg/dL — ABNORMAL HIGH (ref 8–23)
CO2: 22 mmol/L (ref 22–32)
Calcium: 8.7 mg/dL — ABNORMAL LOW (ref 8.9–10.3)
Chloride: 98 mmol/L (ref 98–111)
Creatinine, Ser: 1.63 mg/dL — ABNORMAL HIGH (ref 0.44–1.00)
GFR, Estimated: 34 mL/min — ABNORMAL LOW (ref >60)
Glucose, Bld: 208 mg/dL — ABNORMAL HIGH (ref 70–99)
Potassium: 3.7 mmol/L (ref 3.5–5.1)
Sodium: 132 mmol/L — ABNORMAL LOW (ref 135–145)
Total Bilirubin: 2.3 mg/dL — ABNORMAL HIGH (ref <1.2)
Total Protein: 7.5 g/dL (ref 6.5–8.1)

## 2023-10-02 LAB — I-STAT CG4 LACTIC ACID, ED: Lactic Acid, Venous: 1.5 mmol/L (ref 0.5–1.9)

## 2023-10-02 LAB — PROTIME-INR
INR: 1.2 (ref 0.8–1.2)
Prothrombin Time: 15.6 s — ABNORMAL HIGH (ref 11.4–15.2)

## 2023-10-02 LAB — APTT: aPTT: 37 s — ABNORMAL HIGH (ref 24–36)

## 2023-10-02 MED ORDER — ACETAMINOPHEN 325 MG PO TABS
650.0000 mg | ORAL_TABLET | Freq: Four times a day (QID) | ORAL | Status: DC | PRN
  Administered 2023-10-02 – 2023-10-03 (×2): 650 mg via ORAL
  Filled 2023-10-02 (×2): qty 2

## 2023-10-02 MED ORDER — SODIUM CHLORIDE 0.9 % IV SOLN: INTRAVENOUS | Status: AC

## 2023-10-02 MED ORDER — HEPARIN SODIUM (PORCINE) 5000 UNIT/ML IJ SOLN
5000.0000 [IU] | Freq: Three times a day (TID) | INTRAMUSCULAR | Status: DC
  Administered 2023-10-02 – 2023-10-05 (×8): 5000 [IU] via SUBCUTANEOUS
  Filled 2023-10-02 (×8): qty 1

## 2023-10-02 MED ORDER — ONDANSETRON HCL 4 MG PO TABS: 4.0000 mg | ORAL_TABLET | Freq: Four times a day (QID) | ORAL | Status: DC | PRN

## 2023-10-02 MED ORDER — SENNOSIDES-DOCUSATE SODIUM 8.6-50 MG PO TABS: 1.0000 | ORAL_TABLET | Freq: Every evening | ORAL | Status: DC | PRN

## 2023-10-02 MED ORDER — SODIUM CHLORIDE 0.9 % IV SOLN
2.0000 g | Freq: Once | INTRAVENOUS | Status: AC
  Administered 2023-10-02: 2 g via INTRAVENOUS
  Filled 2023-10-02: qty 20

## 2023-10-02 MED ORDER — SODIUM CHLORIDE 0.9 % IV BOLUS
2000.0000 mL | Freq: Once | INTRAVENOUS | Status: AC
  Administered 2023-10-02: 2000 mL via INTRAVENOUS

## 2023-10-02 MED ORDER — IOHEXOL 300 MG/ML  SOLN
80.0000 mL | Freq: Once | INTRAMUSCULAR | Status: AC | PRN
  Administered 2023-10-02: 80 mL via INTRAVENOUS

## 2023-10-02 MED ORDER — ACETAMINOPHEN 650 MG RE SUPP: 650.0000 mg | Freq: Four times a day (QID) | RECTAL | Status: DC | PRN

## 2023-10-02 MED ORDER — TRAMADOL HCL 50 MG PO TABS: 50.0000 mg | ORAL_TABLET | Freq: Four times a day (QID) | ORAL | Status: DC | PRN

## 2023-10-02 MED ORDER — INSULIN ASPART 100 UNIT/ML IJ SOLN
0.0000 [IU] | Freq: Every day | INTRAMUSCULAR | Status: DC
  Administered 2023-10-03: 2 [IU] via SUBCUTANEOUS
  Filled 2023-10-02: qty 0.05

## 2023-10-02 MED ORDER — ACETAMINOPHEN 325 MG PO TABS
650.0000 mg | ORAL_TABLET | Freq: Once | ORAL | Status: AC
  Administered 2023-10-02: 650 mg via ORAL
  Filled 2023-10-02: qty 2

## 2023-10-02 MED ORDER — ONDANSETRON HCL 4 MG/2ML IJ SOLN
4.0000 mg | Freq: Four times a day (QID) | INTRAMUSCULAR | Status: DC | PRN
  Administered 2023-10-02: 4 mg via INTRAVENOUS
  Filled 2023-10-02: qty 2

## 2023-10-02 MED ORDER — INSULIN ASPART 100 UNIT/ML IJ SOLN
0.0000 [IU] | Freq: Three times a day (TID) | INTRAMUSCULAR | Status: DC
  Administered 2023-10-03: 3 [IU] via SUBCUTANEOUS
  Administered 2023-10-03 – 2023-10-04 (×3): 2 [IU] via SUBCUTANEOUS
  Administered 2023-10-04: 1 [IU] via SUBCUTANEOUS
  Administered 2023-10-04: 2 [IU] via SUBCUTANEOUS
  Administered 2023-10-05: 1 [IU] via SUBCUTANEOUS
  Filled 2023-10-02: qty 0.09

## 2023-10-02 MED ORDER — SODIUM CHLORIDE 0.9 % IV SOLN
2.0000 g | INTRAVENOUS | Status: DC
  Administered 2023-10-03 – 2023-10-04 (×2): 2 g via INTRAVENOUS
  Filled 2023-10-02 (×3): qty 20

## 2023-10-02 NOTE — H&P (Signed)
History and Physical    Lori Bennett XBJ:[id number] DOB: 08-29-1956 DOA: 10/02/2023  PCP: Melida Quitter, MD  Patient coming from: Home  I have personally briefly reviewed patient's old medical records in [facility]  Chief Complaint: Abdominal pain  HPI: Lori Bennett is a 67 y.o. female with medical history significant for T2DM, HTN, HLD who presented to the ED for evaluation of abdominal pain.  Patient reports 3 days of right flank pain, dysuria, nausea associated with vomiting, chills and rigors.  She went to her PCP earlier today and was noted to have a fever of 102 F and was subsequently sent to the ED for further evaluation. Patient otherwise denies chest pain, dyspnea, diarrhea.  ED Course  Labs/Imaging on admission: I have personally reviewed following labs and imaging studies.  Initial vitals showed BP 83/55, pulse 95, RR 14, temp 102.3 F, SpO2 95% on room air.  Labs showed WBC 19.1, hemoglobin 12.1, platelets 220,000, sodium 132, potassium 3.7, bicarb 22, BUN 27, creatinine 1.63 (baseline 0.6-0.7), serum glucose 208.  AST 67, ALT 75, alk phos 204, total bilirubin 2.3 although results may be affected by hemolysis.  UA showed negative nitrites, moderate leukocytes, 11-20 RBCs, >50 WBCs, rare bacteria.  Blood and urine cultures in process.  Lactic acid 1.5.  Portable chest x-ray negative for focal consolidation, edema, effusion.  CT abdomen/pelvis with contrast showed mild bilateral pyelonephritis and ureteritis without abscess.  Colonic diverticulosis noted.  Mild patchy groundglass opacities in both lower lungs seen.  Moderate sized umbilical hernia containing fat also reported.  Patient was given 2 L normal saline with improvement in blood pressure.  She was given IV ceftriaxone 2 g.  The hospitalist service was consulted to admit for further evaluation and management.  Review of Systems: All systems reviewed and are negative except as documented in history of present  illness above.   Past Medical History:  Diagnosis Date   Ankylosis of right knee    post op TKA 12-06-2022   Chronic knee pain after total replacement of right knee joint    Esophageal dysmotility 01/2021   (04-18-2023  per pt no issue since 2022)  s/p EGD w/ balloon dilatation for dysphasia w/ choking sensation (admission in epic 02-07-2023)   Full dentures    HTN (hypertension)    hx previously followed by cardiologist ,  dr [place], Theron Arista note in epic 05-18-2017 for severe HTN   Hyperlipidemia    Nerve pain    due to nerve pain brought on by prior right knee surgery    Noncompliance with medications    OA (osteoarthritis)    Peripheral neuropathy    PONV (postoperative nausea and vomiting)    With first surgery, none since   Type 2 diabetes mellitus (HCC)    followed by pcp  (04-18-2023  per pt check blood sugar daily am fasting, average  130--175)    Past Surgical History:  Procedure Laterality Date   BALLOON DILATION N/A 02/09/2021   Procedure: BALLOON DILATION;  Surgeon: Napoleon Form, MD;  Location: [facility];  Service: Endoscopy;  Laterality: N/A;   COLONOSCOPY WITH PROPOFOL  03/17/2023   dr Lavon Paganini   CONVERSION TO TOTAL KNEE Right 12/06/2022   Procedure: CONVERSION TO TOTAL KNEE;  Surgeon: Sheral Apley, MD;  Location: [facility];  Service: Orthopedics;  Laterality: Right;   ESOPHAGOGASTRODUODENOSCOPY (EGD) WITH PROPOFOL N/A 02/09/2021   Procedure: ESOPHAGOGASTRODUODENOSCOPY (EGD) WITH PROPOFOL;  Surgeon: Napoleon Form, MD;  Location: [facility];  Service: Endoscopy;  Laterality: N/A;   KNEE CLOSED REDUCTION Right 04/25/2023   Procedure: CLOSED MANIPULATION KNEE;  Surgeon: Sheral Apley, MD;  Location: [facility];  Service: Orthopedics;  Laterality: Right;   PARTIAL KNEE ARTHROPLASTY Right 05/30/2018   @ [facility] by dr Eulah Pont   ROTATOR CUFF REPAIR Right    TOTAL KNEE ARTHROPLASTY Left 04/30/2019   Procedure: TOTAL KNEE ARTHROPLASTY;   Surgeon: Sheral Apley, MD;  Location: [facility];  Service: Orthopedics;  Laterality: Left;   TUBAL LIGATION Bilateral    yrs ago    Social History:  reports that she has never smoked. She has never used smokeless tobacco. She reports that she does not drink alcohol and does not use drugs.  Allergies  Allergen Reactions   Ace Inhibitors Cough   Clonidine Derivatives Other (See Comments)    Severe dry mouth and lethargy.    Family History  Problem Relation Age of Onset   Hypertension Mother    Colon cancer Neg Hx    Esophageal cancer Neg Hx    Rectal cancer Neg Hx    Stomach cancer Neg Hx      Prior to Admission medications   Medication Sig Start Date End Date Taking? Authorizing Provider  acetaminophen (TYLENOL) 500 MG tablet Take 2 tablets (1,000 mg total) by mouth every 6 (six) hours as needed for mild pain or moderate pain. 12/08/22  Yes Gawne, Meghan M, PA-C  amLODipine (NORVASC) 5 MG tablet Take 1 tablet (5 mg total) by mouth 2 (two) times daily. NEED OV. Patient taking differently: Take 5 mg by mouth daily. 06/18/18  Yes [place], Peter M, MD  atorvastatin (LIPITOR) 20 MG tablet Take 20 mg by mouth daily.   Yes [provider]  carvedilol (COREG) 12.5 MG tablet Take 1 tablet (12.5 mg total) by mouth 2 (two) times daily with a meal. NEED OV. Patient taking differently: Take 12.5 mg by mouth 2 (two) times daily with a meal. 06/18/18  Yes [place], Peter M, MD  diclofenac Sodium (VOLTAREN ARTHRITIS PAIN) 1 % GEL Apply topically as needed.   Yes [provider]  Dulaglutide 1.5 MG/0.5ML SOPN Inject 1.5 mg into the skin every 7 (seven) days. Tuesday's ,  in bedtime   Yes [provider]  Empagliflozin (JARDIANCE PO) Take 1 tablet by mouth daily.   Yes [provider]  gabapentin (NEURONTIN) 300 MG capsule Take 300 mg by mouth at bedtime as needed (pain). 09/27/22  Yes [provider]  irbesartan (AVAPRO) 150 MG tablet Take 150 mg by mouth  daily. 09/27/22  Yes [provider]    Physical Exam: Vitals:   10/02/23 1815 10/02/23 1830 10/02/23 1854 10/02/23 1929  BP: 110/67 113/61  133/68  Pulse: 88 92 99 (!) 102  Resp: (!) 24 20 (!) 23   Temp:   97.9 F (36.6 C) (!) 101.9 F (38.8 C)  TempSrc:   Oral Axillary  SpO2: 95% 94% 99% 92%  Weight:      Height:       Constitutional: Resting in bed with rigors Eyes: EOMI, lids and conjunctivae normal ENMT: Mucous membranes are dry. Posterior pharynx clear of any exudate or lesions.Normal dentition.  Neck: normal, supple, no masses. Respiratory: clear to auscultation bilaterally, no wheezing, no crackles. Normal respiratory effort. No accessory muscle use.  Cardiovascular: Regular rate and rhythm, no murmurs / rubs / gallops. No extremity edema. 2+ pedal pulses. Abdomen: Right flank tenderness, no masses palpated Musculoskeletal:  no clubbing / cyanosis. No joint deformity upper and lower extremities. Good ROM, no contractures. Normal muscle tone.  Skin: no rashes, lesions, ulcers. No induration Neurologic: Sensation intact. Strength 5/5 in all 4.  Psychiatric: Normal judgment and insight. Alert and oriented x 3. Normal mood.   EKG: Personally reviewed. Sinus rhythm, rate 95, no acute ischemic changes.  Assessment/Plan Principal Problem:   Sepsis due to urinary tract infection (HCC) Active Problems:   AKI (acute kidney injury) (HCC)   DM2 (diabetes mellitus, type 2) (HCC)   Hypertension associated with diabetes (HCC)   Hyperlipidemia associated with type 2 diabetes mellitus (HCC)   Lori Bennett is a 67 y.o. female with medical history significant for T2DM, HTN, HLD who is admitted with sepsis due to bilateral pyelonephritis/ureteritis.  Assessment and Plan: Sepsis due to bilateral pyelonephritis/ureteritis: Presenting with fever, leukocytosis, CT suggestive of bilateral pyelonephritis/ureteritis.  No evidence of obstruction or abscess. -Continue IV  ceftriaxone -Follow blood and urine cultures -Continue IV fluid hydration overnight  Acute kidney injury: Creatinine 1.63 on admit compared to baseline 0.6-0.7.  Secondary to above. -Continue IV fluid hydration overnight -Hold Jardiance and irbesartan  Type 2 diabetes: Holding Jardiance and dulaglutide.  Placed on SSI.  Hypertension: Antihypertensives on hold due to hypotension on arrival.  Hyperlipidemia: Abnormal LFTs noted on labs although this may be lab error related to hemolysis.  Will hold statin until repeat labs obtained.   DVT prophylaxis: heparin injection 5,000 Units Start: 10/02/23 2200 Code Status: Full code, confirmed with patient on admission Family Communication: None at bedside Disposition Plan: From home, likely discharge to home pending clinical progress Consults called: None Severity of Illness: The appropriate patient status for this patient is INPATIENT. Inpatient status is judged to be reasonable and necessary in order to provide the required intensity of service to ensure the patient's safety. The patient's presenting symptoms, physical exam findings, and initial radiographic and laboratory data in the context of their chronic comorbidities is felt to place them at high risk for further clinical deterioration. Furthermore, it is not anticipated that the patient will be medically stable for discharge from the hospital within 2 midnights of admission.   * I certify that at the point of admission it is my clinical judgment that the patient will require inpatient hospital care spanning beyond 2 midnights from the point of admission due to high intensity of service, high risk for further deterioration and high frequency of surveillance required.Darreld Mclean MD [facility]  If 7PM-7AM, please contact night-coverage [email or web address]  10/02/2023, 8:11 PM

## 2023-10-02 NOTE — Sepsis Progress Note (Signed)
[facility] will follow per sepsis protocol  

## 2023-10-02 NOTE — ED Provider Notes (Signed)
[facility] AT [facility] Provider Note   CSN: [id number] Arrival date & time: 10/02/23  1424     History {Add pertinent medical, surgical, social history, OB history to HPI:1} Chief Complaint  Patient presents with   Abdominal Pain   Fever    Lori Bennett is a 67 y.o. female.  Patient has a history of diabetes and hypertension.  She has been feeling bad and had a fever of 102.  She also complains of dysuria and lower abdominal discomfort   Abdominal Pain Associated symptoms: fever   Fever      Home Medications Prior to Admission medications   Medication Sig Start Date End Date Taking? Authorizing Provider  acetaminophen (TYLENOL) 500 MG tablet Take 2 tablets (1,000 mg total) by mouth every 6 (six) hours as needed for mild pain or moderate pain. 12/08/22  Yes Gawne, Meghan M, PA-C  amLODipine (NORVASC) 5 MG tablet Take 1 tablet (5 mg total) by mouth 2 (two) times daily. NEED OV. Patient taking differently: Take 5 mg by mouth daily. 06/18/18  Yes [place], Peter M, MD  atorvastatin (LIPITOR) 20 MG tablet Take 20 mg by mouth daily.   Yes [provider]  carvedilol (COREG) 12.5 MG tablet Take 1 tablet (12.5 mg total) by mouth 2 (two) times daily with a meal. NEED OV. Patient taking differently: Take 12.5 mg by mouth 2 (two) times daily with a meal. 06/18/18  Yes [place], Peter M, MD  diclofenac Sodium (VOLTAREN ARTHRITIS PAIN) 1 % GEL Apply topically as needed.   Yes [provider]  Dulaglutide 1.5 MG/0.5ML SOPN Inject 1.5 mg into the skin every 7 (seven) days. Tuesday's ,  in bedtime   Yes [provider]  Empagliflozin (JARDIANCE PO) Take 1 tablet by mouth daily.   Yes [provider]  gabapentin (NEURONTIN) 300 MG capsule Take 300 mg by mouth at bedtime as needed (pain). 09/27/22  Yes [provider]  irbesartan (AVAPRO) 150 MG tablet Take 150 mg by mouth daily. 09/27/22  Yes [provider]       Allergies    Ace inhibitors and Clonidine derivatives    Review of Systems   Review of Systems  Constitutional:  Positive for fever.  Gastrointestinal:  Positive for abdominal pain.    Physical Exam Updated Vital Signs BP 110/67 (BP Location: Left Arm)   Pulse 99   Temp 97.9 F (36.6 C) (Oral)   Resp (!) 23   Ht 5\' 3"  (1.6 m)   Wt 72.6 kg   SpO2 99%   BMI 28.34 kg/m  Physical Exam  ED Results / Procedures / Treatments   Labs (all labs ordered are listed, but only abnormal results are displayed) Labs Reviewed  COMPREHENSIVE METABOLIC PANEL - Abnormal; Notable for the following components:      Result Value   Sodium 132 (*)    Glucose, Bld 208 (*)    BUN 27 (*)    Creatinine, Ser 1.63 (*)    Calcium 8.7 (*)    Albumin 3.3 (*)    AST 67 (*)    ALT 75 (*)    Alkaline Phosphatase 204 (*)    Total Bilirubin 2.3 (*)    GFR, Estimated 34 (*)    All other components within normal limits  CBC WITH DIFFERENTIAL/PLATELET - Abnormal; Notable for the following components:   WBC 19.1 (*)    Neutro Abs 15.2 (*)    Monocytes Absolute 1.5 (*)  Abs Immature Granulocytes 0.33 (*)    All other components within normal limits  PROTIME-INR - Abnormal; Notable for the following components:   Prothrombin Time 15.6 (*)    All other components within normal limits  URINALYSIS, W/ REFLEX TO CULTURE (INFECTION SUSPECTED) - Abnormal; Notable for the following components:   Color, Urine AMBER (*)    APPearance CLOUDY (*)    Glucose, UA >=500 (*)    Hgb urine dipstick MODERATE (*)    Protein, ur 100 (*)    Leukocytes,Ua MODERATE (*)    Bacteria, UA RARE (*)    All other components within normal limits  APTT - Abnormal; Notable for the following components:   aPTT 37 (*)    All other components within normal limits  CBG MONITORING, ED - Abnormal; Notable for the following components:   Glucose-Capillary 206 (*)    All other components within normal limits  RESP PANEL BY RT-PCR  (RSV, FLU A&B, COVID)  RVPGX2  CULTURE, BLOOD (ROUTINE X 2)  CULTURE, BLOOD (ROUTINE X 2)  URINE CULTURE  LIPASE, BLOOD  I-STAT CG4 LACTIC ACID, ED    EKG None  Radiology CT ABDOMEN PELVIS W CONTRAST  Result Date: 10/02/2023 CLINICAL DATA:  Acute, non localized abdominal pain the pain began following an insulin injection 6 days ago and has increased with increasing abdominal distention. EXAM: CT ABDOMEN AND PELVIS WITH CONTRAST TECHNIQUE: Multidetector CT imaging of the abdomen and pelvis was performed using the standard protocol following bolus administration of intravenous contrast. RADIATION DOSE REDUCTION: This exam was performed according to the departmental dose-optimization program which includes automated exposure control, adjustment of the mA and/or kV according to patient size and/or use of iterative reconstruction technique. CONTRAST:  80mL OMNIPAQUE IOHEXOL 300 MG/ML  SOLN COMPARISON:  None Available. FINDINGS: Lower chest: Enlarged heart. Mild patchy ground-glass opacities in both lower lungs. Small amount of left lower lobe linear atelectasis or scarring. No pleural fluid. Hepatobiliary: No focal liver abnormality is seen. No gallstones, gallbladder wall thickening, or biliary dilatation. Pancreas: Unremarkable. No pancreatic ductal dilatation or surrounding inflammatory changes. Spleen: Normal in size without focal abnormality. Adrenals/Urinary Tract: Normal-appearing adrenal glands. Simple appearing upper pole right renal cyst. This does not need imaging follow-up. Minimal heterogeneity at both kidneys with minimal bilateral perinephric soft tissue stranding. Mild bilateral kidney and ureteral mucosal enhancement. No significant urine in the bladder. Stomach/Bowel: Multiple colonic diverticula without evidence of diverticulitis. Normal-appearing appendix. Unremarkable stomach and small bowel. Vascular/Lymphatic: No significant vascular findings are present. No enlarged abdominal or  pelvic lymph nodes. Reproductive: Uterus and bilateral adnexa are unremarkable. Other: Moderate-sized umbilical hernia containing fat. No free peritoneal fluid or air. Musculoskeletal: Lumbar and lower thoracic spine degenerative changes. IMPRESSION: 1. Mild bilateral pyelonephritis and ureteritis without abscess. 2. Colonic diverticulosis. 3. Moderate-sized umbilical hernia containing fat. 4. Mild patchy ground-glass opacities in both lower lungs, nonspecific. This is nonspecific and most likely represents minimal interstitial pulmonary edema. 5. Cardiomegaly. Electronically Signed   By: Beckie Salts M.D.   On: 10/02/2023 17:42   DG Chest Port 1 View  Result Date: 10/02/2023 CLINICAL DATA:  Possible sepsis.  Fever. EXAM: PORTABLE CHEST 1 VIEW COMPARISON:  Chest x-ray and CT chest dated February 06, 2021. FINDINGS: Borderline cardiomegaly. Normal pulmonary vascularity. No focal consolidation, pleural effusion, or pneumothorax. No acute osseous abnormality. IMPRESSION: No active disease. Electronically Signed   By: Obie Dredge M.D.   On: 10/02/2023 16:30    Procedures Procedures  {Document cardiac monitor,  telemetry assessment procedure when appropriate:1}  Medications Ordered in ED Medications  sodium chloride 0.9 % bolus 2,000 mL (0 mLs Intravenous Stopped 10/02/23 1845)  cefTRIAXone (ROCEPHIN) 2 g in sodium chloride 0.9 % 100 mL IVPB (0 g Intravenous Stopped 10/02/23 1643)  acetaminophen (TYLENOL) tablet 650 mg (650 mg Oral Given 10/02/23 1559)  iohexol (OMNIPAQUE) 300 MG/ML solution 80 mL (80 mLs Intravenous Contrast Given 10/02/23 1617)    ED Course/ Medical Decision Making/ A&P   {  CRITICAL CARE Performed by: Bethann Berkshire Total critical care time: 45 minutes Critical care time was exclusive of separately billable procedures and treating other patients. Critical care was necessary to treat or prevent imminent or life-threatening deterioration. Critical care was time spent personally by me  on the following activities: development of treatment plan with patient and/or surrogate as well as nursing, discussions with consultants, evaluation of patient's response to treatment, examination of patient, obtaining history from patient or surrogate, ordering and performing treatments and interventions, ordering and review of laboratory studies, ordering and review of radiographic studies, pulse oximetry and re-evaluation of patient's condition.  Click here for ABCD2, HEART and other calculatorsREFRESH Note before signing :1}                              Medical Decision Making Amount and/or Complexity of Data Reviewed Labs: ordered. Radiology: ordered. ECG/medicine tests: ordered.  Risk OTC drugs. Prescription drug management.   Patient with pyelonephritis.  She is started on Rocephin and will be admitted to medicine  {Document critical care time when appropriate:1} {Document review of labs and clinical decision tools ie heart score, Chads2Vasc2 etc:1}  {Document your independent review of radiology images, and any outside records:1} {Document your discussion with family members, caretakers, and with consultants:1} {Document social determinants of health affecting pt's care:1} {Document your decision making why or why not admission, treatments were needed:1} Final Clinical Impression(s) / ED Diagnoses Final diagnoses:  None    Rx / DC Orders ED Discharge Orders     None

## 2023-10-02 NOTE — Hospital Course (Signed)
Lori Bennett is a 67 y.o. female with medical history significant for T2DM, HTN, HLD who is admitted with sepsis due to bilateral pyelonephritis/ureteritis.

## 2023-10-02 NOTE — ED Triage Notes (Signed)
Patient presents from her MD office due to concerns of possible Appendicitis, Pancreatitis or Pyelonephritis. Patient noticed abdominal pain post insulin injection on this past Tuesday. Since then pain and increased and the family reports noticing her abdomin was increasing in size. White the Her MD office, she had a fever of 102. Her family report her CBG being as high as 306 at home prior to going to the MD office.

## 2023-10-03 DIAGNOSIS — N390 Urinary tract infection, site not specified: Secondary | ICD-10-CM | POA: Diagnosis not present

## 2023-10-03 DIAGNOSIS — A419 Sepsis, unspecified organism: Secondary | ICD-10-CM | POA: Diagnosis not present

## 2023-10-03 LAB — BLOOD CULTURE ID PANEL (REFLEXED) - BCID2

## 2023-10-03 LAB — HEMOGLOBIN A1C
Hgb A1c MFr Bld: 7.2 % — ABNORMAL HIGH (ref 4.8–5.6)
Mean Plasma Glucose: 159.94 mg/dL

## 2023-10-03 LAB — BRAIN NATRIURETIC PEPTIDE: B Natriuretic Peptide: 222.6 pg/mL — ABNORMAL HIGH (ref 0.0–100.0)

## 2023-10-03 LAB — COMPREHENSIVE METABOLIC PANEL
ALT: 57 U/L — ABNORMAL HIGH (ref 0–44)
AST: 53 U/L — ABNORMAL HIGH (ref 15–41)
Albumin: 2.9 g/dL — ABNORMAL LOW (ref 3.5–5.0)
Alkaline Phosphatase: 205 U/L — ABNORMAL HIGH (ref 38–126)
Anion gap: 11 (ref 5–15)
BUN: 19 mg/dL (ref 8–23)
CO2: 22 mmol/L (ref 22–32)
Calcium: 7.8 mg/dL — ABNORMAL LOW (ref 8.9–10.3)
Chloride: 108 mmol/L (ref 98–111)
Creatinine, Ser: 1.11 mg/dL — ABNORMAL HIGH (ref 0.44–1.00)
GFR, Estimated: 54 mL/min — ABNORMAL LOW (ref >60)
Glucose, Bld: 167 mg/dL — ABNORMAL HIGH (ref 70–99)
Potassium: 3.2 mmol/L — ABNORMAL LOW (ref 3.5–5.1)
Sodium: 141 mmol/L (ref 135–145)
Total Bilirubin: 1.7 mg/dL — ABNORMAL HIGH (ref <1.2)
Total Protein: 6.4 g/dL — ABNORMAL LOW (ref 6.5–8.1)

## 2023-10-03 LAB — CBG MONITORING, ED
Glucose-Capillary: 202 mg/dL — ABNORMAL HIGH (ref 70–99)
Glucose-Capillary: 159 mg/dL — ABNORMAL HIGH (ref 70–99)

## 2023-10-03 LAB — CBC
HCT: 32.4 % — ABNORMAL LOW (ref 36.0–46.0)
Hemoglobin: 10.2 g/dL — ABNORMAL LOW (ref 12.0–15.0)
MCH: 29.5 pg (ref 26.0–34.0)
MCHC: 31.5 g/dL (ref 30.0–36.0)
MCV: 93.6 fL (ref 80.0–100.0)
Platelets: 210 10*3/uL (ref 150–400)
RBC: 3.46 MIL/uL — ABNORMAL LOW (ref 3.87–5.11)
RDW: 14.1 % (ref 11.5–15.5)
WBC: 21.7 10*3/uL — ABNORMAL HIGH (ref 4.0–10.5)
nRBC: 0 % (ref 0.0–0.2)

## 2023-10-03 LAB — LACTIC ACID, PLASMA: Lactic Acid, Venous: 1.3 mmol/L (ref 0.5–1.9)

## 2023-10-03 LAB — HIV ANTIBODY (ROUTINE TESTING W REFLEX): HIV Screen 4th Generation wRfx: NONREACTIVE

## 2023-10-03 LAB — GLUCOSE, CAPILLARY
Glucose-Capillary: 193 mg/dL — ABNORMAL HIGH (ref 70–99)
Glucose-Capillary: 206 mg/dL — ABNORMAL HIGH (ref 70–99)

## 2023-10-03 LAB — PROCALCITONIN: Procalcitonin: 14.85 ng/mL

## 2023-10-03 MED ORDER — SODIUM CHLORIDE 0.9 % IV SOLN: INTRAVENOUS | Status: DC

## 2023-10-03 MED ORDER — POTASSIUM CHLORIDE CRYS ER 20 MEQ PO TBCR
40.0000 meq | EXTENDED_RELEASE_TABLET | Freq: Once | ORAL | Status: AC
  Administered 2023-10-03: 40 meq via ORAL
  Filled 2023-10-03: qty 2

## 2023-10-03 MED ORDER — POTASSIUM CHLORIDE IN NACL 40-0.9 MEQ/L-% IV SOLN
INTRAVENOUS | Status: AC
  Filled 2023-10-03: qty 1000

## 2023-10-03 MED ORDER — SODIUM CHLORIDE 0.9 % IV BOLUS
1000.0000 mL | Freq: Once | INTRAVENOUS | Status: AC
  Administered 2023-10-03: 1000 mL via INTRAVENOUS

## 2023-10-03 NOTE — Progress Notes (Signed)
PROGRESS NOTE    Lori Bennett  XBJ:[id number] DOB: [birth year] DOA: 10/02/2023 PCP: Melida Quitter, MD   Brief Narrative:  HPI: Lori Bennett is a 67 y.o. female with medical history significant for T2DM, HTN, HLD who presented to the ED for evaluation of abdominal pain.   Patient reports 3 days of right flank pain, dysuria, nausea associated with vomiting, chills and rigors.  She went to her PCP earlier today and was noted to have a fever of 102 F and was subsequently sent to the ED for further evaluation. Patient otherwise denies chest pain, dyspnea, diarrhea.   ED Course  Labs/Imaging on admission: I have personally reviewed following labs and imaging studies.   Initial vitals showed BP 83/55, pulse 95, RR 14, temp 102.3 F, SpO2 95% on room air.   Labs showed WBC 19.1, hemoglobin 12.1, platelets 220,000, sodium 132, potassium 3.7, bicarb 22, BUN 27, creatinine 1.63 (baseline 0.6-0.7), serum glucose 208.  AST 67, ALT 75, alk phos 204, total bilirubin 2.3 although results may be affected by hemolysis.   UA showed negative nitrites, moderate leukocytes, 11-20 RBCs, >50 WBCs, rare bacteria.  Blood and urine cultures in process.  Lactic acid 1.5.   Portable chest x-ray negative for focal consolidation, edema, effusion.   CT abdomen/pelvis with contrast showed mild bilateral pyelonephritis and ureteritis without abscess.  Colonic diverticulosis noted.  Mild patchy groundglass opacities in both lower lungs seen.  Moderate sized umbilical hernia containing fat also reported.   Patient was given 2 L normal saline with improvement in blood pressure.  She was given IV ceftriaxone 2 g.  The hospitalist service was consulted to admit for further evaluation and management.  Assessment & Plan:   Principal Problem:   Sepsis due to urinary tract infection (HCC) Active Problems:   AKI (acute kidney injury) (HCC)   DM2 (diabetes mellitus, type 2) (HCC)   Hypertension associated with diabetes  (HCC)   Hyperlipidemia associated with type 2 diabetes mellitus (HCC)  Sepsis due to bilateral pyelonephritis/ureteritis, POA: Meets sepsis criteria based on fever, tachypnea, leukocytosis, CT suggestive of bilateral pyelonephritis/ureteritis, no evidence of obstruction or abscess.  UA consistent with UTI.  Lactic acid was not checked.  Patient has received 3 L of IV fluid bolus.  Doubt she will have lactic acidosis but I will check now.  Continue Rocephin and follow culture and tailor antibiotics and monitor leukocytosis as well as fever curve.   Acute kidney injury: Creatinine 1.63 on admit compared to baseline 0.6-0.7.  Secondary to above.  Creatinine improved to 1.11.  I will give her 1.5 L of IV fluid bolus again and repeat labs in the morning.  Continue to hold nephrotoxic agents including Jardiance and irbesartan.  Hypokalemia: Will replenish.   Type 2 diabetes: Holding Jardiance and dulaglutide.  Placed on SSI.  Blood sugar elevated.  Will check hemoglobin A1c.   Hypertension: Antihypertensives on hold due to hypotension on arrival.  Blood pressure has improved since admission but still low normal side.   Hyperlipidemia/elevated LFTs: Elevated LFTs, likely secondary to sepsis.  LFTs improving though.  Hold statin for now.  Aspiration pneumonia/pulmonary edema: CT abdomen shows mild patchy groundglass opacities in both lower lungs.  Patient denies any shortness of breath.  Clinically she does not appear to have pneumonia symptoms but she does have this finding and leukocytosis so she may be having pneumonia.  She is on Rocephin which will cover it.  She has been placed on 3 L of oxygen  just for comfort but she was saturating 92% on room air.  Acute hypoxic respiratory failure is ruled out. Provide and encourage incentive spirometry.  Chronic diastolic congestive heart failure: Last echo available in the chart was from April 2022 which showed normal ejection fraction but grade 1 diastolic  dysfunction.  Patient denies shortness of breath, no crackles on examination.  Doubt acute exacerbation of CHF.  Will check BNP.    DVT prophylaxis: heparin injection 5,000 Units Start: 10/02/23 2200   Code Status: Full Code  Family Communication:  None present at bedside.  Plan of care discussed with patient in length and he/she verbalized understanding and agreed with it.  Status is: Inpatient Remains inpatient appropriate because: Recovering but needs IV fluids and IV antibiotics until cultures are finalized.   Estimated body mass index is 28.34 kg/m as calculated from the following:   Height as of this encounter: 5\' 3"  (1.6 m).   Weight as of this encounter: 72.6 kg.    Nutritional Assessment: Body mass index is 28.34 kg/m.Marland Kitchen Seen by dietician.  I agree with the assessment and plan as outlined below: Nutrition Status:        . Skin Assessment: I have examined the patient's skin and I agree with the wound assessment as performed by the wound care RN as outlined below:    Consultants:  None  Procedures:  None  Antimicrobials:  Anti-infectives (From admission, onward)    Start     Dose/Rate Route Frequency Ordered Stop   10/03/23 1600  cefTRIAXone (ROCEPHIN) 2 g in sodium chloride 0.9 % 100 mL IVPB        2 g 200 mL/hr over 30 Minutes Intravenous Every 24 hours 10/02/23 2006 10/10/23 1559   10/02/23 1515  cefTRIAXone (ROCEPHIN) 2 g in sodium chloride 0.9 % 100 mL IVPB        2 g 200 mL/hr over 30 Minutes Intravenous  Once 10/02/23 1503 10/02/23 1643         Subjective: Patient seen and examined.  Still complains of the right-sided abdominal pain but improved compared yesterday.  Denies any shortness of breath.  Objective: Vitals:   10/03/23 0547 10/03/23 0800 10/03/23 0850 10/03/23 0930  BP: 118/62 118/66 126/74 134/72  Pulse: 89 87 89 92  Resp: (!) 21 (!) 24 (!) 32 (!) 21  Temp:   98.3 F (36.8 C)   TempSrc:      SpO2: 96% 99% 99% 98%  Weight:       Height:        Intake/Output Summary (Last 24 hours) at 10/03/2023 1008 Last data filed at 10/03/2023 0551 Gross per 24 hour  Intake 1000 ml  Output --  Net 1000 ml   Filed Weights   10/02/23 1438  Weight: 72.6 kg    Examination:  General exam: Appears calm and comfortable  Respiratory system: Diminished breath sounds at the bases bilaterally due to poor inspiratory effort due to pain. Cardiovascular system: S1 & S2 heard, RRR. No JVD, murmurs, rubs, gallops or clicks. No pedal edema. Gastrointestinal system: Abdomen is nondistended, soft and periumbilical, right lower quadrant and right upper quadrant tenderness as well as bilateral CVA tenderness, right greater than left.. No organomegaly or masses felt. Normal bowel sounds heard. Central nervous system: Alert and oriented. No focal neurological deficits. Extremities: Symmetric 5 x 5 power. Skin: No rashes, lesions or ulcers Psychiatry: Judgement and insight appear normal. Mood & affect appropriate.    Data Reviewed: I have personally  reviewed following labs and imaging studies  CBC: Recent Labs  Lab 10/02/23 1451 10/03/23 0600  WBC 19.1* 21.7*  NEUTROABS 15.2*  --   HGB 12.1 10.2*  HCT 36.0 32.4*  MCV 90.0 93.6  PLT 220 210   Basic Metabolic Panel: Recent Labs  Lab 10/02/23 1451 10/03/23 0600  NA 132* 141  K 3.7 3.2*  CL 98 108  CO2 22 22  GLUCOSE 208* 167*  BUN 27* 19  CREATININE 1.63* 1.11*  CALCIUM 8.7* 7.8*   GFR: Estimated Creatinine Clearance: 47 mL/min (A) (by C-G formula based on SCr of 1.11 mg/dL (H)). Liver Function Tests: Recent Labs  Lab 10/02/23 1451 10/03/23 0600  AST 67* 53*  ALT 75* 57*  ALKPHOS 204* 205*  BILITOT 2.3* 1.7*  PROT 7.5 6.4*  ALBUMIN 3.3* 2.9*   Recent Labs  Lab 10/02/23 1613  LIPASE 45   No results for input(s): "AMMONIA" in the last 168 hours. Coagulation Profile: Recent Labs  Lab 10/02/23 1451  INR 1.2   Cardiac Enzymes: No results for input(s):  "CKTOTAL", "CKMB", "CKMBINDEX", "TROPONINI" in the last 168 hours. BNP (last 3 results) No results for input(s): "PROBNP" in the last 8760 hours. HbA1C: No results for input(s): "HGBA1C" in the last 72 hours. CBG: Recent Labs  Lab 10/02/23 1446 10/02/23 2351 10/03/23 0836  GLUCAP 206* 188* 202*   Lipid Profile: No results for input(s): "CHOL", "HDL", "LDLCALC", "TRIG", "CHOLHDL", "LDLDIRECT" in the last 72 hours. Thyroid Function Tests: No results for input(s): "TSH", "T4TOTAL", "FREET4", "T3FREE", "THYROIDAB" in the last 72 hours. Anemia Panel: No results for input(s): "VITAMINB12", "FOLATE", "FERRITIN", "TIBC", "IRON", "RETICCTPCT" in the last 72 hours. Sepsis Labs: Recent Labs  Lab 10/02/23 1509 10/03/23 0600  PROCALCITON  --  14.85  LATICACIDVEN 1.5  --     Recent Results (from the past 240 hour(s))  Culture, blood (Routine x 2)     Status: None (Preliminary result)   Collection Time: 10/02/23  3:00 PM   Specimen: BLOOD  Result Value Ref Range Status   Specimen Description   Final    BLOOD RIGHT ANTECUBITAL Performed at [facility], [address], [place], [place] [address]    Special Requests   Final    BOTTLES DRAWN AEROBIC AND ANAEROBIC Blood Culture adequate volume Performed at [facility], [address], [place], [place] [address]    Culture  Setup Time   Final    GRAM NEGATIVE RODS ANAEROBIC BOTTLE ONLY CRITICAL RESULT CALLED TO, READ BACK BY AND VERIFIED WITH: PHARMD J.LEGGE AT 2536 ON 10/03/2023 BY T.SAAD. Performed at [facility], [address], [place], [place] [address]    Culture GRAM NEGATIVE RODS  Final   Report Status PENDING  Incomplete  Blood Culture ID Panel (Reflexed)     Status: Abnormal   Collection Time: 10/02/23  3:00 PM  Result Value Ref Range Status   Enterococcus faecalis NOT DETECTED NOT DETECTED Final   Enterococcus Faecium NOT DETECTED NOT DETECTED Final   Listeria monocytogenes  NOT DETECTED NOT DETECTED Final   Staphylococcus species NOT DETECTED NOT DETECTED Final   Staphylococcus aureus (BCID) NOT DETECTED NOT DETECTED Final   Staphylococcus epidermidis NOT DETECTED NOT DETECTED Final   Staphylococcus lugdunensis NOT DETECTED NOT DETECTED Final   Streptococcus species NOT DETECTED NOT DETECTED Final   Streptococcus agalactiae NOT DETECTED NOT DETECTED Final   Streptococcus pneumoniae NOT DETECTED NOT DETECTED Final   Streptococcus pyogenes NOT DETECTED  NOT DETECTED Final   A.calcoaceticus-baumannii NOT DETECTED NOT DETECTED Final   Bacteroides fragilis NOT DETECTED NOT DETECTED Final   Enterobacterales DETECTED (A) NOT DETECTED Final    Comment: Enterobacterales represent a large order of gram negative bacteria, not a single organism. CRITICAL RESULT CALLED TO, READ BACK BY AND VERIFIED WITH: PHARMD J.LEGGE AT 8295 ON 10/03/2023 BY T.SAAD.    Enterobacter cloacae complex NOT DETECTED NOT DETECTED Final   Escherichia coli DETECTED (A) NOT DETECTED Final    Comment: CRITICAL RESULT CALLED TO, READ BACK BY AND VERIFIED WITH: PHARMD J.LEGGE AT 6213 ON 10/03/2023 BY T.SAAD.    Klebsiella aerogenes NOT DETECTED NOT DETECTED Final   Klebsiella oxytoca NOT DETECTED NOT DETECTED Final   Klebsiella pneumoniae NOT DETECTED NOT DETECTED Final   Proteus species NOT DETECTED NOT DETECTED Final   Salmonella species NOT DETECTED NOT DETECTED Final   Serratia marcescens NOT DETECTED NOT DETECTED Final   Haemophilus influenzae NOT DETECTED NOT DETECTED Final   Neisseria meningitidis NOT DETECTED NOT DETECTED Final   Pseudomonas aeruginosa NOT DETECTED NOT DETECTED Final   Stenotrophomonas maltophilia NOT DETECTED NOT DETECTED Final   Candida albicans NOT DETECTED NOT DETECTED Final   Candida auris NOT DETECTED NOT DETECTED Final   Candida glabrata NOT DETECTED NOT DETECTED Final   Candida krusei NOT DETECTED NOT DETECTED Final   Candida parapsilosis NOT DETECTED NOT  DETECTED Final   Candida tropicalis NOT DETECTED NOT DETECTED Final   Cryptococcus neoformans/gattii NOT DETECTED NOT DETECTED Final   CTX-M ESBL NOT DETECTED NOT DETECTED Final   Carbapenem resistance IMP NOT DETECTED NOT DETECTED Final   Carbapenem resistance KPC NOT DETECTED NOT DETECTED Final   Carbapenem resistance NDM NOT DETECTED NOT DETECTED Final   Carbapenem resist OXA 48 LIKE NOT DETECTED NOT DETECTED Final   Carbapenem resistance VIM NOT DETECTED NOT DETECTED Final    Comment: Performed at [facility], [address] [place], [place] [address]  Resp panel by RT-PCR (RSV, Flu A&B, Covid) Anterior Nasal Swab     Status: None   Collection Time: 10/02/23  3:45 PM   Specimen: Anterior Nasal Swab  Result Value Ref Range Status   SARS Coronavirus 2 by RT PCR NEGATIVE NEGATIVE Final    Comment: (NOTE) SARS-CoV-2 target nucleic acids are NOT DETECTED.  The SARS-CoV-2 RNA is generally detectable in upper respiratory specimens during the acute phase of infection. The lowest concentration of SARS-CoV-2 viral copies this assay can detect is 138 copies/mL. A negative result does not preclude SARS-Cov-2 infection and should not be used as the sole basis for treatment or other patient management decisions. A negative result may occur with  improper specimen collection/handling, submission of specimen other than nasopharyngeal swab, presence of viral mutation(s) within the areas targeted by this assay, and inadequate number of viral copies(<138 copies/mL). A negative result must be combined with clinical observations, patient history, and epidemiological information. The expected result is Negative.  Fact Sheet for Patients:  BloggerCourse.com  Fact Sheet for Healthcare Providers:  SeriousBroker.it  This test is no t yet approved or cleared by the [place] FDA and  has been authorized for detection and/or diagnosis of  SARS-CoV-2 by FDA under an Emergency Use Authorization (EUA). This EUA will remain  in effect (meaning this test can be used) for the duration of the COVID-19 declaration under Section 564(b)(1) of the Act, 21 U.S.C.section 360bbb-3(b)(1), unless the authorization is terminated  or revoked sooner.  Influenza A by PCR NEGATIVE NEGATIVE Final   Influenza B by PCR NEGATIVE NEGATIVE Final    Comment: (NOTE) The Xpert Xpress SARS-CoV-2/FLU/RSV plus assay is intended as an aid in the diagnosis of influenza from Nasopharyngeal swab specimens and should not be used as a sole basis for treatment. Nasal washings and aspirates are unacceptable for Xpert Xpress SARS-CoV-2/FLU/RSV testing.  Fact Sheet for Patients: BloggerCourse.com  Fact Sheet for Healthcare Providers: SeriousBroker.it  This test is not yet approved or cleared by the [place] FDA and has been authorized for detection and/or diagnosis of SARS-CoV-2 by FDA under an Emergency Use Authorization (EUA). This EUA will remain in effect (meaning this test can be used) for the duration of the COVID-19 declaration under Section 564(b)(1) of the Act, 21 U.S.C. section 360bbb-3(b)(1), unless the authorization is terminated or revoked.     Resp Syncytial Virus by PCR NEGATIVE NEGATIVE Final    Comment: (NOTE) Fact Sheet for Patients: BloggerCourse.com  Fact Sheet for Healthcare Providers: SeriousBroker.it  This test is not yet approved or cleared by the [place] FDA and has been authorized for detection and/or diagnosis of SARS-CoV-2 by FDA under an Emergency Use Authorization (EUA). This EUA will remain in effect (meaning this test can be used) for the duration of the COVID-19 declaration under Section 564(b)(1) of the Act, 21 U.S.C. section 360bbb-3(b)(1), unless the authorization is terminated  or revoked.  Performed at [facility], [address] [place], [place] [address]   Culture, blood (Routine x 2)     Status: None (Preliminary result)   Collection Time: 10/02/23  4:13 PM   Specimen: BLOOD LEFT HAND  Result Value Ref Range Status   Specimen Description   Final    BLOOD LEFT HAND Performed at [facility], [address], [place], [place] [address]    Special Requests   Final    BOTTLES DRAWN AEROBIC ONLY Blood Culture results may not be optimal due to an inadequate volume of blood received in culture bottles Performed at [facility], [address], [place], [place] [address]    Culture  Setup Time   Final    GRAM NEGATIVE RODS AEROBIC BOTTLE ONLY CRITICAL VALUE NOTED.  VALUE IS CONSISTENT WITH PREVIOUSLY REPORTED AND CALLED VALUE. Performed at [facility], [address], [place], [place] [address]    Culture GRAM NEGATIVE RODS  Final   Report Status PENDING  Incomplete     Radiology Studies: CT ABDOMEN PELVIS W CONTRAST  Result Date: 10/02/2023 CLINICAL DATA:  Acute, non localized abdominal pain the pain began following an insulin injection 6 days ago and has increased with increasing abdominal distention. EXAM: CT ABDOMEN AND PELVIS WITH CONTRAST TECHNIQUE: Multidetector CT imaging of the abdomen and pelvis was performed using the standard protocol following bolus administration of intravenous contrast. RADIATION DOSE REDUCTION: This exam was performed according to the departmental dose-optimization program which includes automated exposure control, adjustment of the mA and/or kV according to patient size and/or use of iterative reconstruction technique. CONTRAST:  80mL OMNIPAQUE IOHEXOL 300 MG/ML  SOLN COMPARISON:  None Available. FINDINGS: Lower chest: Enlarged heart. Mild patchy ground-glass opacities in both lower lungs. Small amount of left lower lobe linear atelectasis or scarring. No pleural fluid.  Hepatobiliary: No focal liver abnormality is seen. No gallstones, gallbladder wall thickening, or biliary dilatation. Pancreas: Unremarkable. No pancreatic ductal dilatation or surrounding inflammatory changes. Spleen: Normal in size without focal abnormality. Adrenals/Urinary Tract:  Normal-appearing adrenal glands. Simple appearing upper pole right renal cyst. This does not need imaging follow-up. Minimal heterogeneity at both kidneys with minimal bilateral perinephric soft tissue stranding. Mild bilateral kidney and ureteral mucosal enhancement. No significant urine in the bladder. Stomach/Bowel: Multiple colonic diverticula without evidence of diverticulitis. Normal-appearing appendix. Unremarkable stomach and small bowel. Vascular/Lymphatic: No significant vascular findings are present. No enlarged abdominal or pelvic lymph nodes. Reproductive: Uterus and bilateral adnexa are unremarkable. Other: Moderate-sized umbilical hernia containing fat. No free peritoneal fluid or air. Musculoskeletal: Lumbar and lower thoracic spine degenerative changes. IMPRESSION: 1. Mild bilateral pyelonephritis and ureteritis without abscess. 2. Colonic diverticulosis. 3. Moderate-sized umbilical hernia containing fat. 4. Mild patchy ground-glass opacities in both lower lungs, nonspecific. This is nonspecific and most likely represents minimal interstitial pulmonary edema. 5. Cardiomegaly. Electronically Signed   By: Beckie Salts M.D.   On: 10/02/2023 17:42   DG Chest Port 1 View  Result Date: 10/02/2023 CLINICAL DATA:  Possible sepsis.  Fever. EXAM: PORTABLE CHEST 1 VIEW COMPARISON:  Chest x-ray and CT chest dated February 06, 2021. FINDINGS: Borderline cardiomegaly. Normal pulmonary vascularity. No focal consolidation, pleural effusion, or pneumothorax. No acute osseous abnormality. IMPRESSION: No active disease. Electronically Signed   By: Obie Dredge M.D.   On: 10/02/2023 16:30    Scheduled Meds:  heparin  5,000 Units  Subcutaneous Q8H   insulin aspart  0-5 Units Subcutaneous QHS   insulin aspart  0-9 Units Subcutaneous TID WC   potassium chloride  40 mEq Oral Once   Continuous Infusions:  0.9 % NaCl with KCl 40 mEq / L     cefTRIAXone (ROCEPHIN)  IV       LOS: 1 day   Hughie Closs, MD [facility]  10/03/2023, 10:08 AM   *Please note that this is a verbal dictation therefore any spelling or grammatical errors are due to the "Dragon Medical One" system interpretation.  Please page via Amion and do not message via secure chat for urgent patient care matters. Secure chat can be used for non urgent patient care matters.  How to contact the [facility] Attending or Consulting provider 7A - 7P or covering provider during after hours 7P -7A, for this patient?  Check the care team in [facility] and look for a) attending/consulting [facility] provider listed and b) the [facility] team listed. Page or secure chat 7A-7P. Log into [email or web address] and use [facility]'s universal password to access. If you do not have the password, please contact the hospital operator. Locate the [facility] provider you are looking for under [facility] and page to a number that you can be directly reached. If you still have difficulty reaching the provider, please page the [facility] (Director on Call) for the Hospitalists listed on amion for assistance.

## 2023-10-03 NOTE — Progress Notes (Signed)
PHARMACY - PHYSICIAN COMMUNICATION CRITICAL VALUE ALERT - BLOOD CULTURE IDENTIFICATION (BCID)  Lori Bennett is an 67 y.o. female who presented to [facility] on 10/02/2023 with a chief complaint of abdominal pain  Assessment:  Ecoli bacteremia   Name of physician (or Provider) Contacted: Pahwani   Current antibiotics: rocephin 2g q24  Changes to prescribed antibiotics recommended:  Patient is on recommended antibiotics - No changes needed  Results for orders placed or performed during the hospital encounter of 10/02/23  Blood Culture ID Panel (Reflexed) (Collected: 10/02/2023  3:00 PM)  Result Value Ref Range   Enterococcus faecalis NOT DETECTED NOT DETECTED   Enterococcus Faecium NOT DETECTED NOT DETECTED   Listeria monocytogenes NOT DETECTED NOT DETECTED   Staphylococcus species NOT DETECTED NOT DETECTED   Staphylococcus aureus (BCID) NOT DETECTED NOT DETECTED   Staphylococcus epidermidis NOT DETECTED NOT DETECTED   Staphylococcus lugdunensis NOT DETECTED NOT DETECTED   Streptococcus species NOT DETECTED NOT DETECTED   Streptococcus agalactiae NOT DETECTED NOT DETECTED   Streptococcus pneumoniae NOT DETECTED NOT DETECTED   Streptococcus pyogenes NOT DETECTED NOT DETECTED   A.calcoaceticus-baumannii NOT DETECTED NOT DETECTED   Bacteroides fragilis NOT DETECTED NOT DETECTED   Enterobacterales DETECTED (A) NOT DETECTED   Enterobacter cloacae complex NOT DETECTED NOT DETECTED   Escherichia coli DETECTED (A) NOT DETECTED   Klebsiella aerogenes NOT DETECTED NOT DETECTED   Klebsiella oxytoca NOT DETECTED NOT DETECTED   Klebsiella pneumoniae NOT DETECTED NOT DETECTED   Proteus species NOT DETECTED NOT DETECTED   Salmonella species NOT DETECTED NOT DETECTED   Serratia marcescens NOT DETECTED NOT DETECTED   Haemophilus influenzae NOT DETECTED NOT DETECTED   Neisseria meningitidis NOT DETECTED NOT DETECTED   Pseudomonas aeruginosa NOT DETECTED NOT DETECTED   Stenotrophomonas  maltophilia NOT DETECTED NOT DETECTED   Candida albicans NOT DETECTED NOT DETECTED   Candida auris NOT DETECTED NOT DETECTED   Candida glabrata NOT DETECTED NOT DETECTED   Candida krusei NOT DETECTED NOT DETECTED   Candida parapsilosis NOT DETECTED NOT DETECTED   Candida tropicalis NOT DETECTED NOT DETECTED   Cryptococcus neoformans/gattii NOT DETECTED NOT DETECTED   CTX-M ESBL NOT DETECTED NOT DETECTED   Carbapenem resistance IMP NOT DETECTED NOT DETECTED   Carbapenem resistance KPC NOT DETECTED NOT DETECTED   Carbapenem resistance NDM NOT DETECTED NOT DETECTED   Carbapenem resist OXA 48 LIKE NOT DETECTED NOT DETECTED   Carbapenem resistance VIM NOT DETECTED NOT DETECTED    Berkley Harvey 10/03/2023  8:23 AM

## 2023-10-03 NOTE — ED Notes (Signed)
ED TO INPATIENT HANDOFF REPORT  Name/Age/Gender Lori Bennett 67 y.o. female  Code Status    Code Status Orders  (From admission, onward)           Start     Ordered   10/02/23 2005  Full code  Continuous       Question:  By:  Answer:  Consent: discussion documented in EHR   10/02/23 2006           Code Status History     Date Active Date Inactive Code Status Order ID Comments User Context   12/06/2022 1537 12/08/2022 1832 Full Code [id number]  Marzetta Board Inpatient   02/06/2021 1513 02/09/2021 2051 Full Code [id number]  Littie Deeds, MD ED   04/30/2019 1108 05/01/2019 1423 Full Code [id number]  Rudean Hitt, PA-C Inpatient       Home/SNF/Other Home  Chief Complaint Sepsis due to urinary tract infection (HCC) [A41.9, N39.0]  Level of Care/Admitting Diagnosis ED Disposition     ED Disposition  Admit   Condition  --   Comment  Hospital Area: [facility] [[id number]]  Level of Care: Progressive [102]  Admit to Progressive based on following criteria: MULTISYSTEM THREATS such as stable sepsis, metabolic/electrolyte imbalance with or without encephalopathy that is responding to early treatment.  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   Chronic knee pain after total replacement of right knee joint    Esophageal dysmotility 01/2021   (04-18-2023  per pt no issue since 2022)  s/p EGD w/ balloon dilatation for dysphasia  w/ choking sensation (admission in epic 02-07-2023)   Full dentures    HTN (hypertension)    hx previously followed by cardiologist ,  dr [place], Theron Arista note in epic 05-18-2017 for severe HTN   Hyperlipidemia    Nerve pain    due to nerve pain brought on by prior right knee surgery    Noncompliance with medications    OA (osteoarthritis)    Peripheral neuropathy    PONV (postoperative nausea and vomiting)    With first surgery, none since   Type 2 diabetes mellitus (HCC)    followed by pcp  (04-18-2023  per pt check blood sugar daily am fasting, average  130--175)    Allergies Allergies  Allergen Reactions   Ace Inhibitors Cough   Clonidine Derivatives Other (See Comments)    Severe dry mouth and lethargy.    IV Location/Drains/Wounds Patient Lines/Drains/Airways Status     Active Line/Drains/Airways     Name Placement date Placement time Site Days   Peripheral IV 10/02/23 20 G Right Antecubital 10/02/23  1535  Antecubital  1            Labs/Imaging Results for orders placed or performed during the hospital encounter of 10/02/23 (from the past 48 hour(s))  CBG monitoring, ED     Status: Abnormal   Collection Time: 10/02/23  2:46 PM  Result  Value Ref Range   Glucose-Capillary 206 (H) 70 - 99 mg/dL    Comment: Glucose reference range applies only to samples taken after fasting for at least 8 hours.  Comprehensive metabolic panel     Status: Abnormal   Collection Time: 10/02/23  2:51 PM  Result Value Ref Range   Sodium 132 (L) 135 - 145 mmol/L   Potassium 3.7 3.5 - 5.1 mmol/L    Comment: HEMOLYSIS AT THIS LEVEL MAY AFFECT RESULT   Chloride 98 98 - 111 mmol/L   CO2 22 22 - 32 mmol/L   Glucose, Bld 208 (H) 70 - 99 mg/dL    Comment: Glucose reference range applies only to samples taken after fasting for at least 8 hours.   BUN 27 (H) 8 - 23 mg/dL   Creatinine, Ser 8.46 (H) 0.44 - 1.00 mg/dL   Calcium 8.7 (L) 8.9 - 10.3 mg/dL   Total Protein 7.5 6.5 - 8.1 g/dL    Albumin 3.3 (L) 3.5 - 5.0 g/dL   AST 67 (H) 15 - 41 U/L    Comment: HEMOLYSIS AT THIS LEVEL MAY AFFECT RESULT   ALT 75 (H) 0 - 44 U/L    Comment: HEMOLYSIS AT THIS LEVEL MAY AFFECT RESULT   Alkaline Phosphatase 204 (H) 38 - 126 U/L   Total Bilirubin 2.3 (H) <1.2 mg/dL    Comment: HEMOLYSIS AT THIS LEVEL MAY AFFECT RESULT   GFR, Estimated 34 (L) >60 mL/min    Comment: (NOTE) Calculated using the CKD-EPI Creatinine Equation (2021)    Anion gap 12 5 - 15    Comment: Performed at [facility], [address] [place], [place] [address]  CBC with Differential     Status: Abnormal   Collection Time: 10/02/23  2:51 PM  Result Value Ref Range   WBC 19.1 (H) 4.0 - 10.5 K/uL   RBC 4.00 3.87 - 5.11 MIL/uL   Hemoglobin 12.1 12.0 - 15.0 g/dL   HCT 28.4 13.2 - 44.0 %   MCV 90.0 80.0 - 100.0 fL   MCH 30.3 26.0 - 34.0 pg   MCHC 33.6 30.0 - 36.0 g/dL   RDW 10.2 72.5 - 36.6 %   Platelets 220 150 - 400 K/uL   nRBC 0.0 0.0 - 0.2 %   Neutrophils Relative % 79 %   Neutro Abs 15.2 (H) 1.7 - 7.7 K/uL   Lymphocytes Relative 11 %   Lymphs Abs 2.0 0.7 - 4.0 K/uL   Monocytes Relative 8 %   Monocytes Absolute 1.5 (H) 0.1 - 1.0 K/uL   Eosinophils Relative 0 %   Eosinophils Absolute 0.1 0.0 - 0.5 K/uL   Basophils Relative 0 %   Basophils Absolute 0.1 0.0 - 0.1 K/uL   Immature Granulocytes 2 %   Abs Immature Granulocytes 0.33 (H) 0.00 - 0.07 K/uL    Comment: Performed at [facility], [address] [place], [place] [address]  Protime-INR     Status: Abnormal   Collection Time: 10/02/23  2:51 PM  Result Value Ref Range   Prothrombin Time 15.6 (H) 11.4 - 15.2 seconds   INR 1.2 0.8 - 1.2    Comment: (NOTE) INR goal varies based on device and disease states. Performed at [facility], [address], [place], [place] [address]   APTT     Status: Abnormal   Collection Time: 10/02/23  2:51 PM  Result Value Ref Range   aPTT 37 (H) 24 - 36  seconds    Comment:        IF BASELINE aPTT IS ELEVATED, SUGGEST PATIENT RISK ASSESSMENT BE USED TO DETERMINE APPROPRIATE ANTICOAGULANT THERAPY. Performed at [facility], [address], [place], [place] [address]   Culture, blood (Routine x 2)     Status: None (Preliminary result)   Collection Time: 10/02/23  3:00 PM   Specimen: BLOOD  Result Value Ref Range   Specimen Description      BLOOD RIGHT ANTECUBITAL Performed at [facility], [address], [place], [place] [address]    Special Requests      BOTTLES DRAWN AEROBIC AND ANAEROBIC Blood Culture adequate volume Performed at [facility], [address], [place], [place] [address]    Culture  Setup Time      GRAM NEGATIVE RODS ANAEROBIC BOTTLE ONLY CRITICAL RESULT CALLED TO, READ BACK BY AND VERIFIED WITH: PHARMD J.LEGGE AT 9147 ON 10/03/2023 BY T.SAAD. Performed at [facility], [address], [place], [place] [address]    Culture GRAM NEGATIVE RODS    Report Status PENDING   Blood Culture ID Panel (Reflexed)     Status: Abnormal   Collection Time: 10/02/23  3:00 PM  Result Value Ref Range   Enterococcus faecalis NOT DETECTED NOT DETECTED   Enterococcus Faecium NOT DETECTED NOT DETECTED   Listeria monocytogenes NOT DETECTED NOT DETECTED   Staphylococcus species NOT DETECTED NOT DETECTED   Staphylococcus aureus (BCID) NOT DETECTED NOT DETECTED   Staphylococcus epidermidis NOT DETECTED NOT DETECTED   Staphylococcus lugdunensis NOT DETECTED NOT DETECTED   Streptococcus species NOT DETECTED NOT DETECTED   Streptococcus agalactiae NOT DETECTED NOT DETECTED   Streptococcus pneumoniae NOT DETECTED NOT DETECTED   Streptococcus pyogenes NOT DETECTED NOT DETECTED   A.calcoaceticus-baumannii NOT DETECTED NOT DETECTED   Bacteroides fragilis NOT DETECTED NOT DETECTED   Enterobacterales DETECTED (A) NOT DETECTED    Comment: Enterobacterales represent a large order  of gram negative bacteria, not a single organism. CRITICAL RESULT CALLED TO, READ BACK BY AND VERIFIED WITH: PHARMD J.LEGGE AT 2130 ON 10/03/2023 BY T.SAAD.    Enterobacter cloacae complex NOT DETECTED NOT DETECTED   Escherichia coli DETECTED (A) NOT DETECTED    Comment: CRITICAL RESULT CALLED TO, READ BACK BY AND VERIFIED WITH: PHARMD J.LEGGE AT 8657 ON 10/03/2023 BY T.SAAD.    Klebsiella aerogenes NOT DETECTED NOT DETECTED   Klebsiella oxytoca NOT DETECTED NOT DETECTED   Klebsiella pneumoniae NOT DETECTED NOT DETECTED   Proteus species NOT DETECTED NOT DETECTED   Salmonella species NOT DETECTED NOT DETECTED   Serratia marcescens NOT DETECTED NOT DETECTED   Haemophilus influenzae NOT DETECTED NOT DETECTED   Neisseria meningitidis NOT DETECTED NOT DETECTED   Pseudomonas aeruginosa NOT DETECTED NOT DETECTED   Stenotrophomonas maltophilia NOT DETECTED NOT DETECTED   Candida albicans NOT DETECTED NOT DETECTED   Candida auris NOT DETECTED NOT DETECTED   Candida glabrata NOT DETECTED NOT DETECTED   Candida krusei NOT DETECTED NOT DETECTED   Candida parapsilosis NOT DETECTED NOT DETECTED   Candida tropicalis NOT DETECTED NOT DETECTED   Cryptococcus neoformans/gattii NOT DETECTED NOT DETECTED   CTX-M ESBL NOT DETECTED NOT DETECTED   Carbapenem resistance IMP NOT DETECTED NOT DETECTED   Carbapenem resistance KPC NOT DETECTED NOT DETECTED   Carbapenem resistance NDM NOT DETECTED NOT DETECTED   Carbapenem resist OXA 48 LIKE NOT DETECTED NOT DETECTED   Carbapenem resistance VIM NOT DETECTED NOT DETECTED  Comment: Performed at [facility], [address] [place], [place] [address]  I-Stat Lactic Acid, ED     Status: None   Collection Time: 10/02/23  3:09 PM  Result Value Ref Range   Lactic Acid, Venous 1.5 0.5 - 1.9 mmol/L  Resp panel by RT-PCR (RSV, Flu A&B, Covid) Anterior Nasal Swab     Status: None   Collection Time: 10/02/23  3:45 PM   Specimen: Anterior Nasal Swab  Result  Value Ref Range   SARS Coronavirus 2 by RT PCR NEGATIVE NEGATIVE    Comment: (NOTE) SARS-CoV-2 target nucleic acids are NOT DETECTED.  The SARS-CoV-2 RNA is generally detectable in upper respiratory specimens during the acute phase of infection. The lowest concentration of SARS-CoV-2 viral copies this assay can detect is 138 copies/mL. A negative result does not preclude SARS-Cov-2 infection and should not be used as the sole basis for treatment or other patient management decisions. A negative result may occur with  improper specimen collection/handling, submission of specimen other than nasopharyngeal swab, presence of viral mutation(s) within the areas targeted by this assay, and inadequate number of viral copies(<138 copies/mL). A negative result must be combined with clinical observations, patient history, and epidemiological information. The expected result is Negative.  Fact Sheet for Patients:  BloggerCourse.com  Fact Sheet for Healthcare Providers:  SeriousBroker.it  This test is no t yet approved or cleared by the [place] FDA and  has been authorized for detection and/or diagnosis of SARS-CoV-2 by FDA under an Emergency Use Authorization (EUA). This EUA will remain  in effect (meaning this test can be used) for the duration of the COVID-19 declaration under Section 564(b)(1) of the Act, 21 U.S.C.section 360bbb-3(b)(1), unless the authorization is terminated  or revoked sooner.       Influenza A by PCR NEGATIVE NEGATIVE   Influenza B by PCR NEGATIVE NEGATIVE    Comment: (NOTE) The Xpert Xpress SARS-CoV-2/FLU/RSV plus assay is intended as an aid in the diagnosis of influenza from Nasopharyngeal swab specimens and should not be used as a sole basis for treatment. Nasal washings and aspirates are unacceptable for Xpert Xpress SARS-CoV-2/FLU/RSV testing.  Fact Sheet for  Patients: BloggerCourse.com  Fact Sheet for Healthcare Providers: SeriousBroker.it  This test is not yet approved or cleared by the [place] FDA and has been authorized for detection and/or diagnosis of SARS-CoV-2 by FDA under an Emergency Use Authorization (EUA). This EUA will remain in effect (meaning this test can be used) for the duration of the COVID-19 declaration under Section 564(b)(1) of the Act, 21 U.S.C. section 360bbb-3(b)(1), unless the authorization is terminated or revoked.     Resp Syncytial Virus by PCR NEGATIVE NEGATIVE    Comment: (NOTE) Fact Sheet for Patients: BloggerCourse.com  Fact Sheet for Healthcare Providers: SeriousBroker.it  This test is not yet approved or cleared by the [place] FDA and has been authorized for detection and/or diagnosis of SARS-CoV-2 by FDA under an Emergency Use Authorization (EUA). This EUA will remain in effect (meaning this test can be used) for the duration of the COVID-19 declaration under Section 564(b)(1) of the Act, 21 U.S.C. section 360bbb-3(b)(1), unless the authorization is terminated or revoked.  Performed at [facility], [address], [place], [place] [address]   Urinalysis, w/ Reflex to Culture (Infection Suspected) -Urine, Clean Catch     Status: Abnormal   Collection Time: 10/02/23  4:04 PM  Result Value Ref Range   Specimen Source URINE, CATHETERIZED  Color, Urine AMBER (A) YELLOW    Comment: BIOCHEMICALS MAY BE AFFECTED BY COLOR   APPearance CLOUDY (A) CLEAR   Specific Gravity, Urine 1.016 1.005 - 1.030   pH 5.0 5.0 - 8.0   Glucose, UA >=500 (A) NEGATIVE mg/dL   Hgb urine dipstick MODERATE (A) NEGATIVE   Bilirubin Urine NEGATIVE NEGATIVE   Ketones, ur NEGATIVE NEGATIVE mg/dL   Protein, ur 657 (A) NEGATIVE mg/dL   Nitrite NEGATIVE NEGATIVE   Leukocytes,Ua MODERATE (A)  NEGATIVE   RBC / HPF 11-20 0 - 5 RBC/hpf   WBC, UA >50 0 - 5 WBC/hpf    Comment:        Reflex urine culture not performed if WBC <=10, OR if Squamous epithelial cells >5. If Squamous epithelial cells >5 suggest recollection.    Bacteria, UA RARE (A) NONE SEEN   Squamous Epithelial / HPF 6-10 0 - 5 /HPF   WBC Clumps PRESENT    Mucus PRESENT     Comment: Performed at [facility], [address], [place], [place] [address]  Culture, blood (Routine x 2)     Status: None (Preliminary result)   Collection Time: 10/02/23  4:13 PM   Specimen: BLOOD LEFT HAND  Result Value Ref Range   Specimen Description      BLOOD LEFT HAND Performed at [facility], [address], [place], [place] [address]    Special Requests      BOTTLES DRAWN AEROBIC ONLY Blood Culture results may not be optimal due to an inadequate volume of blood received in culture bottles Performed at [facility], [address], [place], [place] [address]    Culture  Setup Time      GRAM NEGATIVE RODS AEROBIC BOTTLE ONLY CRITICAL VALUE NOTED.  VALUE IS CONSISTENT WITH PREVIOUSLY REPORTED AND CALLED VALUE. Performed at [facility], [address] [place], [place] [address]    Culture GRAM NEGATIVE RODS    Report Status PENDING   Lipase, blood     Status: None   Collection Time: 10/02/23  4:13 PM  Result Value Ref Range   Lipase 45 11 - 51 U/L    Comment: Performed at [facility], [address] [place], [place] [address]  CBG monitoring, ED     Status: Abnormal   Collection Time: 10/02/23 11:51 PM  Result Value Ref Range   Glucose-Capillary 188 (H) 70 - 99 mg/dL    Comment: Glucose reference range applies only to samples taken after fasting for at least 8 hours.  Procalcitonin     Status: None   Collection Time: 10/03/23  6:00 AM  Result Value Ref Range   Procalcitonin 14.85 ng/mL    Comment:        Interpretation: PCT >= 10  ng/mL: Important systemic inflammatory response, almost exclusively due to severe bacterial sepsis or septic shock. (NOTE)       Sepsis PCT Algorithm           Lower Respiratory Tract                                      Infection PCT Algorithm    ----------------------------     ----------------------------         PCT < 0.25 ng/mL                PCT <  0.10 ng/mL          Strongly encourage             Strongly discourage   discontinuation of antibiotics    initiation of antibiotics    ----------------------------     -----------------------------       PCT 0.25 - 0.50 ng/mL            PCT 0.10 - 0.25 ng/mL               OR       >80% decrease in PCT            Discourage initiation of                                            antibiotics      Encourage discontinuation           of antibiotics    ----------------------------     -----------------------------         PCT >= 0.50 ng/mL              PCT 0.26 - 0.50 ng/mL                AND       <80% decrease in PCT             Encourage initiation of                                             antibiotics       Encourage continuation           of antibiotics    ----------------------------     -----------------------------        PCT >= 0.50 ng/mL                  PCT > 0.50 ng/mL               AND         increase in PCT                  Strongly encourage                                      initiation of antibiotics    Strongly encourage escalation           of antibiotics                                     -----------------------------                                           PCT <= 0.25 ng/mL                                                 OR                                        >  80% decrease in PCT                                      Discontinue / Do not initiate                                             antibiotics  Performed at [facility], [address] [place], [place] [address]    CBC     Status: Abnormal   Collection Time: 10/03/23  6:00 AM  Result Value Ref Range   WBC 21.7 (H) 4.0 - 10.5 K/uL   RBC 3.46 (L) 3.87 - 5.11 MIL/uL   Hemoglobin 10.2 (L) 12.0 - 15.0 g/dL   HCT 60.4 (L) 54.0 - 98.1 %   MCV 93.6 80.0 - 100.0 fL   MCH 29.5 26.0 - 34.0 pg   MCHC 31.5 30.0 - 36.0 g/dL   RDW 19.1 47.8 - 29.5 %   Platelets 210 150 - 400 K/uL   nRBC 0.0 0.0 - 0.2 %    Comment: Performed at [facility], [address], [place], [place] [address]  Comprehensive metabolic panel     Status: Abnormal   Collection Time: 10/03/23  6:00 AM  Result Value Ref Range   Sodium 141 135 - 145 mmol/L    Comment: DELTA CHECK NOTED   Potassium 3.2 (L) 3.5 - 5.1 mmol/L   Chloride 108 98 - 111 mmol/L   CO2 22 22 - 32 mmol/L   Glucose, Bld 167 (H) 70 - 99 mg/dL    Comment: Glucose reference range applies only to samples taken after fasting for at least 8 hours.   BUN 19 8 - 23 mg/dL   Creatinine, Ser 8.65 (H) 0.44 - 1.00 mg/dL   Calcium 7.8 (L) 8.9 - 10.3 mg/dL   Total Protein 6.4 (L) 6.5 - 8.1 g/dL   Albumin 2.9 (L) 3.5 - 5.0 g/dL   AST 53 (H) 15 - 41 U/L   ALT 57 (H) 0 - 44 U/L   Alkaline Phosphatase 205 (H) 38 - 126 U/L   Total Bilirubin 1.7 (H) <1.2 mg/dL   GFR, Estimated 54 (L) >60 mL/min    Comment: (NOTE) Calculated using the CKD-EPI Creatinine Equation (2021)    Anion gap 11 5 - 15    Comment: Performed at [facility], [address], [place], [place] [address]  CBG monitoring, ED     Status: Abnormal   Collection Time: 10/03/23  8:36 AM  Result Value Ref Range   Glucose-Capillary 202 (H) 70 - 99 mg/dL    Comment: Glucose reference range applies only to samples taken after fasting for at least 8 hours.   CT ABDOMEN PELVIS W CONTRAST  Result Date: 10/02/2023 CLINICAL DATA:  Acute, non localized abdominal pain the pain began following an insulin injection 6 days ago and has increased with increasing abdominal distention. EXAM: CT  ABDOMEN AND PELVIS WITH CONTRAST TECHNIQUE: Multidetector CT imaging of the abdomen and pelvis was performed using the standard protocol following bolus administration of intravenous contrast. RADIATION DOSE REDUCTION: This exam was performed according to the departmental dose-optimization program which includes automated exposure control, adjustment of the mA and/or kV according to patient size and/or use of iterative reconstruction technique. CONTRAST:  80mL OMNIPAQUE IOHEXOL 300 MG/ML  SOLN COMPARISON:  None Available. FINDINGS: Lower chest: Enlarged heart. Mild patchy ground-glass opacities in both lower lungs. Small amount of left lower lobe linear atelectasis or scarring. No pleural fluid. Hepatobiliary: No focal liver abnormality is seen. No gallstones, gallbladder wall thickening, or biliary dilatation. Pancreas: Unremarkable. No pancreatic ductal dilatation or surrounding inflammatory changes. Spleen: Normal in size without focal abnormality. Adrenals/Urinary Tract: Normal-appearing adrenal glands. Simple appearing upper pole right renal cyst. This does not need imaging follow-up. Minimal heterogeneity at both kidneys with minimal bilateral perinephric soft tissue stranding. Mild bilateral kidney and ureteral mucosal enhancement. No significant urine in the bladder. Stomach/Bowel: Multiple colonic diverticula without evidence of diverticulitis. Normal-appearing appendix. Unremarkable stomach and small bowel. Vascular/Lymphatic: No significant vascular findings are present. No enlarged abdominal or pelvic lymph nodes. Reproductive: Uterus and bilateral adnexa are unremarkable. Other: Moderate-sized umbilical hernia containing fat. No free peritoneal fluid or air. Musculoskeletal: Lumbar and lower thoracic spine degenerative changes. IMPRESSION: 1. Mild bilateral pyelonephritis and ureteritis without abscess. 2. Colonic diverticulosis. 3. Moderate-sized umbilical hernia containing fat. 4. Mild patchy  ground-glass opacities in both lower lungs, nonspecific. This is nonspecific and most likely represents minimal interstitial pulmonary edema. 5. Cardiomegaly. Electronically Signed   By: Beckie Salts M.D.   On: 10/02/2023 17:42   DG Chest Port 1 View  Result Date: 10/02/2023 CLINICAL DATA:  Possible sepsis.  Fever. EXAM: PORTABLE CHEST 1 VIEW COMPARISON:  Chest x-ray and CT chest dated February 06, 2021. FINDINGS: Borderline cardiomegaly. Normal pulmonary vascularity. No focal consolidation, pleural effusion, or pneumothorax. No acute osseous abnormality. IMPRESSION: No active disease. Electronically Signed   By: Obie Dredge M.D.   On: 10/02/2023 16:30    Pending Labs Unresulted Labs (From admission, onward)     Start     Ordered   10/04/23 0500  CBC with Differential/Platelet  Tomorrow morning,   R        10/03/23 0920   10/04/23 0500  Comprehensive metabolic panel  Tomorrow morning,   R        10/03/23 0920   10/03/23 1017  Brain natriuretic peptide  Add-on,   AD        10/03/23 1016   10/03/23 1013  Hemoglobin A1c  Add-on,   AD        10/03/23 1012   10/03/23 1011  Lactic acid, plasma  (Lactic Acid)  ONCE - STAT,   STAT        10/03/23 1010   10/03/23 0500  HIV Antibody (routine testing w rflx)  (HIV Antibody (Routine testing w reflex) panel)  Tomorrow morning,   R        10/02/23 2006   10/02/23 1644  Urine Culture  Add-on,   AD       Question:  Indication  Answer:  Dysuria   10/02/23 1643            Vitals/Pain Today's Vitals   10/03/23 0930 10/03/23 1000 10/03/23 1030 10/03/23 1130  BP: 134/72 (!) 118/99 106/65 122/72  Pulse: 92 86 88 92  Resp: (!) 21 (!) 28 (!) 32 (!) 33  Temp:      TempSrc:      SpO2: 98% 98% 99% 98%  Weight:      Height:      PainSc:        Isolation Precautions No active isolations  Medications Medications  heparin injection 5,000 Units (5,000 Units Subcutaneous Given 10/03/23 0542)  cefTRIAXone (ROCEPHIN)  2 g in sodium chloride 0.9 %  100 mL IVPB (has no administration in time range)  acetaminophen (TYLENOL) tablet 650 mg (650 mg Oral Given 10/02/23 2049)    Or  acetaminophen (TYLENOL) suppository 650 mg ( Rectal See Alternative 10/02/23 2049)  traMADol (ULTRAM) tablet 50 mg (has no administration in time range)  ondansetron (ZOFRAN) tablet 4 mg ( Oral See Alternative 10/02/23 2043)    Or  ondansetron (ZOFRAN) injection 4 mg (4 mg Intravenous Given 10/02/23 2043)  senna-docusate (Senokot-S) tablet 1 tablet (has no administration in time range)  insulin aspart (novoLOG) injection 0-9 Units (3 Units Subcutaneous Given 10/03/23 0840)  insulin aspart (novoLOG) injection 0-5 Units ( Subcutaneous Not Given 10/02/23 2354)  0.9 %  sodium chloride infusion ( Intravenous Rate/Dose Change 10/03/23 0245)  0.9 % NaCl with KCl 40 mEq / L  infusion (has no administration in time range)  sodium chloride 0.9 % bolus 2,000 mL (0 mLs Intravenous Stopped 10/02/23 1845)  cefTRIAXone (ROCEPHIN) 2 g in sodium chloride 0.9 % 100 mL IVPB (0 g Intravenous Stopped 10/02/23 1643)  acetaminophen (TYLENOL) tablet 650 mg (650 mg Oral Given 10/02/23 1559)  iohexol (OMNIPAQUE) 300 MG/ML solution 80 mL (80 mLs Intravenous Contrast Given 10/02/23 1617)  sodium chloride 0.9 % bolus 1,000 mL (0 mLs Intravenous Stopped 10/03/23 0551)  potassium chloride SA (KLOR-CON M) CR tablet 40 mEq (40 mEq Oral Given 10/03/23 1146)    Mobility walks with person assist

## 2023-10-04 DIAGNOSIS — N390 Urinary tract infection, site not specified: Secondary | ICD-10-CM | POA: Diagnosis not present

## 2023-10-04 DIAGNOSIS — A419 Sepsis, unspecified organism: Secondary | ICD-10-CM | POA: Diagnosis not present

## 2023-10-04 LAB — CBC WITH DIFFERENTIAL/PLATELET
Abs Immature Granulocytes: 0.19 10*3/uL — ABNORMAL HIGH (ref 0.00–0.07)
Basophils Absolute: 0.1 10*3/uL (ref 0.0–0.1)
Basophils Relative: 0 %
Eosinophils Absolute: 0.2 10*3/uL (ref 0.0–0.5)
Eosinophils Relative: 1 %
HCT: 33.3 % — ABNORMAL LOW (ref 36.0–46.0)
Hemoglobin: 10.3 g/dL — ABNORMAL LOW (ref 12.0–15.0)
Immature Granulocytes: 1 %
Lymphocytes Relative: 15 %
Lymphs Abs: 3.1 10*3/uL (ref 0.7–4.0)
MCH: 28.5 pg (ref 26.0–34.0)
MCHC: 30.9 g/dL (ref 30.0–36.0)
MCV: 92.2 fL (ref 80.0–100.0)
Monocytes Absolute: 1.6 10*3/uL — ABNORMAL HIGH (ref 0.1–1.0)
Monocytes Relative: 8 %
Neutro Abs: 15.1 10*3/uL — ABNORMAL HIGH (ref 1.7–7.7)
Neutrophils Relative %: 75 %
Platelets: 275 10*3/uL (ref 150–400)
RBC: 3.61 MIL/uL — ABNORMAL LOW (ref 3.87–5.11)
RDW: 14.2 % (ref 11.5–15.5)
WBC: 20.4 10*3/uL — ABNORMAL HIGH (ref 4.0–10.5)
nRBC: 0 % (ref 0.0–0.2)

## 2023-10-04 LAB — COMPREHENSIVE METABOLIC PANEL
ALT: 53 U/L — ABNORMAL HIGH (ref 0–44)
AST: 48 U/L — ABNORMAL HIGH (ref 15–41)
Albumin: 2.9 g/dL — ABNORMAL LOW (ref 3.5–5.0)
Alkaline Phosphatase: 237 U/L — ABNORMAL HIGH (ref 38–126)
Anion gap: 9 (ref 5–15)
BUN: 10 mg/dL (ref 8–23)
CO2: 25 mmol/L (ref 22–32)
Calcium: 9.2 mg/dL (ref 8.9–10.3)
Chloride: 109 mmol/L (ref 98–111)
Creatinine, Ser: 0.86 mg/dL (ref 0.44–1.00)
GFR, Estimated: >60 mL/min (ref >60)
Glucose, Bld: 124 mg/dL — ABNORMAL HIGH (ref 70–99)
Potassium: 3 mmol/L — ABNORMAL LOW (ref 3.5–5.1)
Sodium: 143 mmol/L (ref 135–145)
Total Bilirubin: 0.7 mg/dL (ref <1.2)
Total Protein: 7.4 g/dL (ref 6.5–8.1)

## 2023-10-04 LAB — GLUCOSE, CAPILLARY
Glucose-Capillary: 145 mg/dL — ABNORMAL HIGH (ref 70–99)
Glucose-Capillary: 154 mg/dL — ABNORMAL HIGH (ref 70–99)
Glucose-Capillary: 182 mg/dL — ABNORMAL HIGH (ref 70–99)
Glucose-Capillary: 189 mg/dL — ABNORMAL HIGH (ref 70–99)

## 2023-10-04 LAB — URINE CULTURE: Culture: >=100000 — AB

## 2023-10-04 MED ORDER — POTASSIUM CHLORIDE CRYS ER 20 MEQ PO TBCR
40.0000 meq | EXTENDED_RELEASE_TABLET | ORAL | Status: AC
  Administered 2023-10-04 (×2): 40 meq via ORAL
  Filled 2023-10-04 (×2): qty 2

## 2023-10-04 MED ORDER — HYDRALAZINE HCL 20 MG/ML IJ SOLN: 10.0000 mg | Freq: Four times a day (QID) | INTRAMUSCULAR | Status: DC | PRN

## 2023-10-04 NOTE — Progress Notes (Signed)
Mobility Specialist - Progress Note   10/04/23 1120  Mobility  Activity Ambulated independently in hallway  Level of Assistance Independent  Assistive Device None  Distance Ambulated (ft) 350 ft  Activity Response Tolerated well  Mobility Referral Yes  Mobility visit 1 Mobility  Mobility Specialist Start Time (ACUTE ONLY) 1112  Mobility Specialist Stop Time (ACUTE ONLY) 1119  Mobility Specialist Time Calculation (min) (ACUTE ONLY) 7 min   Pt received in bed and agreeable to mobility. No complaints during session. Pt to bed after session with all needs met.   [facility]

## 2023-10-04 NOTE — Progress Notes (Signed)
PROGRESS NOTE    Lori Bennett  ZOX:[id number] DOB: 04-Nov-1955 DOA: 10/02/2023 PCP: Melida Quitter, MD   Brief Narrative:  Lori Bennett is a 67 y.o. female with medical history significant for T2DM, HTN, HLD who presented to the ED for evaluation of abdominal pain/right flank pain for 3 days along with dysuria and urgency, was admitted with sepsis secondary to bilateral pyelonephritis and now has E. coli bacteremia.  Assessment & Plan:   Principal Problem:   Sepsis due to urinary tract infection (HCC) Active Problems:   AKI (acute kidney injury) (HCC)   DM2 (diabetes mellitus, type 2) (HCC)   Hypertension associated with diabetes (HCC)   Hyperlipidemia associated with type 2 diabetes mellitus (HCC)  Sepsis due to bilateral pyelonephritis/ureteritis/E. coli bacteremia, POA: Meets sepsis criteria based on fever, tachypnea, leukocytosis, CT suggestive of bilateral pyelonephritis/ureteritis, no evidence of obstruction or abscess.  UA consistent with UTI.  Lactic acid normal.  She is now growing E. coli in both urine as well as blood culture.  She is afebrile but still has leukocytosis around 20.  Pain has almost resolved.  Continue Rocephin and follow final sensitivities of the culture and tailor antibiotics accordingly.   Acute kidney injury: Creatinine 1.63 on admit compared to baseline 0.6-0.7.  Secondary to sepsis.  Now resolved with IV fluids.    Hypokalemia: Low again, Will replenish.   Type 2 diabetes: Hemoglobin A1c 7.2.  Holding Jardiance and dulaglutide.  Placed on SSI.  Blood sugar labile.   Hypertension: Antihypertensives on hold due to hypotension on arrival.  Blood pressure has improved since admission but still within normal range so continue to hold antihypertensives.  Will start on as needed IV hydralazine.   Hyperlipidemia/elevated LFTs: Elevated LFTs, likely secondary to sepsis.  Slight improvement in LFTs.  Continue to hold statin.  Aspiration pneumonia/pulmonary  edema ruled out: CT abdomen shows mild patchy groundglass opacities in both lower lungs but also says possible vascular congestion.  Patient denies any shortness of breath.  Clinically she does not appear to have pneumonia symptoms.  She has been placed on 3 L of oxygen just for comfort but she was saturating 92% on room air.  Currently she is on room air.  Acute hypoxic respiratory failure is ruled out. Provide and encourage incentive spirometry.  Chronic diastolic congestive heart failure: Last echo available in the chart was from April 2022 which showed normal ejection fraction but grade 1 diastolic dysfunction.  Patient denies shortness of breath, no crackles on examination.  Doubt acute exacerbation of CHF.    DVT prophylaxis: heparin injection 5,000 Units Start: 10/02/23 2200   Code Status: Full Code  Family Communication:  None present at bedside.  Plan of care discussed with patient in length and he/she verbalized understanding and agreed with it.  Status is: Inpatient Remains inpatient appropriate because: Recovering but needs to stay in the hospital with IV antibiotics until culture finalized.   Estimated body mass index is 28.34 kg/m as calculated from the following:   Height as of this encounter: 5\' 3"  (1.6 m).   Weight as of this encounter: 72.6 kg.    Nutritional Assessment: Body mass index is 28.34 kg/m.Marland Kitchen Seen by dietician.  I agree with the assessment and plan as outlined below: Nutrition Status:        . Skin Assessment: I have examined the patient's skin and I agree with the wound assessment as performed by the wound care RN as outlined below:    Consultants:  None  Procedures:  None  Antimicrobials:  Anti-infectives (From admission, onward)    Start     Dose/Rate Route Frequency Ordered Stop   10/03/23 1600  cefTRIAXone (ROCEPHIN) 2 g in sodium chloride 0.9 % 100 mL IVPB        2 g 200 mL/hr over 30 Minutes Intravenous Every 24 hours 10/02/23 2006  10/10/23 1559   10/02/23 1515  cefTRIAXone (ROCEPHIN) 2 g in sodium chloride 0.9 % 100 mL IVPB        2 g 200 mL/hr over 30 Minutes Intravenous  Once 10/02/23 1503 10/02/23 1643         Subjective: Seen and examined.  She says that her pain is almost resolved.  She has no other complaint, no nausea.  Tolerating regular diet.  Objective: Vitals:   10/03/23 1452 10/03/23 1946 10/03/23 2304 10/04/23 0337  BP: 124/68 (!) 141/81 (!) 149/99 127/66  Pulse: (!) 106 97 99 90  Resp: 20 18 18 18   Temp: 99.5 F (37.5 C) 98.9 F (37.2 C) 100 F (37.8 C) 98.9 F (37.2 C)  TempSrc: Oral Oral Oral Oral  SpO2: 93% 94% 100% 94%  Weight:      Height:        Intake/Output Summary (Last 24 hours) at 10/04/2023 0824 Last data filed at 10/04/2023 1610 Gross per 24 hour  Intake 401.11 ml  Output 3600 ml  Net -3198.89 ml   Filed Weights   10/02/23 1438  Weight: 72.6 kg    Examination:  General exam: Appears calm and comfortable  Respiratory system: Clear to auscultation. Respiratory effort normal. Cardiovascular system: S1 & S2 heard, RRR. No JVD, murmurs, rubs, gallops or clicks. No pedal edema. Gastrointestinal system: Abdomen is nondistended, soft and right upper quadrant, right middle lobe and right lower lobe/groin tenderness as well as bilateral CVA tenderness, right greater than the left.. No organomegaly or masses felt. Normal bowel sounds heard. Central nervous system: Alert and oriented. No focal neurological deficits. Extremities: Symmetric 5 x 5 power. Skin: No rashes, lesions or ulcers.  Psychiatry: Judgement and insight appear normal. Mood & affect appropriate.    Data Reviewed: I have personally reviewed following labs and imaging studies  CBC: Recent Labs  Lab 10/02/23 1451 10/03/23 0600 10/04/23 0349  WBC 19.1* 21.7* 20.4*  NEUTROABS 15.2*  --  15.1*  HGB 12.1 10.2* 10.3*  HCT 36.0 32.4* 33.3*  MCV 90.0 93.6 92.2  PLT 220 210 275   Basic Metabolic  Panel: Recent Labs  Lab 10/02/23 1451 10/03/23 0600 10/04/23 0349  NA 132* 141 143  K 3.7 3.2* 3.0*  CL 98 108 109  CO2 22 22 25   GLUCOSE 208* 167* 124*  BUN 27* 19 10  CREATININE 1.63* 1.11* 0.86  CALCIUM 8.7* 7.8* 9.2   GFR: Estimated Creatinine Clearance: 60.6 mL/min (by C-G formula based on SCr of 0.86 mg/dL). Liver Function Tests: Recent Labs  Lab 10/02/23 1451 10/03/23 0600 10/04/23 0349  AST 67* 53* 48*  ALT 75* 57* 53*  ALKPHOS 204* 205* 237*  BILITOT 2.3* 1.7* 0.7  PROT 7.5 6.4* 7.4  ALBUMIN 3.3* 2.9* 2.9*   Recent Labs  Lab 10/02/23 1613  LIPASE 45   No results for input(s): "AMMONIA" in the last 168 hours. Coagulation Profile: Recent Labs  Lab 10/02/23 1451  INR 1.2   Cardiac Enzymes: No results for input(s): "CKTOTAL", "CKMB", "CKMBINDEX", "TROPONINI" in the last 168 hours. BNP (last 3 results) No results for input(s): "PROBNP" in  the last 8760 hours. HbA1C: Recent Labs    10/03/23 1141  HGBA1C 7.2*   CBG: Recent Labs  Lab 10/03/23 0836 10/03/23 1156 10/03/23 1815 10/03/23 2049 10/04/23 0720  GLUCAP 202* 159* 193* 206* 145*   Lipid Profile: No results for input(s): "CHOL", "HDL", "LDLCALC", "TRIG", "CHOLHDL", "LDLDIRECT" in the last 72 hours. Thyroid Function Tests: No results for input(s): "TSH", "T4TOTAL", "FREET4", "T3FREE", "THYROIDAB" in the last 72 hours. Anemia Panel: No results for input(s): "VITAMINB12", "FOLATE", "FERRITIN", "TIBC", "IRON", "RETICCTPCT" in the last 72 hours. Sepsis Labs: Recent Labs  Lab 10/02/23 1509 10/03/23 0600 10/03/23 1141  PROCALCITON  --  14.85  --   LATICACIDVEN 1.5  --  1.3    Recent Results (from the past 240 hour(s))  Culture, blood (Routine x 2)     Status: None (Preliminary result)   Collection Time: 10/02/23  3:00 PM   Specimen: BLOOD  Result Value Ref Range Status   Specimen Description   Final    BLOOD RIGHT ANTECUBITAL Performed at [facility], [address], [place], [place] [address]    Special Requests   Final    BOTTLES DRAWN AEROBIC AND ANAEROBIC Blood Culture adequate volume Performed at [facility], [address] [place], [place] [address]    Culture  Setup Time   Final    GRAM NEGATIVE RODS ANAEROBIC BOTTLE ONLY CRITICAL RESULT CALLED TO, READ BACK BY AND VERIFIED WITH: PHARMD J.LEGGE AT 1191 ON 10/03/2023 BY T.SAAD. Performed at [facility], [address], [place], [place] [address]    Culture GRAM NEGATIVE RODS  Final   Report Status PENDING  Incomplete  Blood Culture ID Panel (Reflexed)     Status: Abnormal   Collection Time: 10/02/23  3:00 PM  Result Value Ref Range Status   Enterococcus faecalis NOT DETECTED NOT DETECTED Final   Enterococcus Faecium NOT DETECTED NOT DETECTED Final   Listeria monocytogenes NOT DETECTED NOT DETECTED Final   Staphylococcus species NOT DETECTED NOT DETECTED Final   Staphylococcus aureus (BCID) NOT DETECTED NOT DETECTED Final   Staphylococcus epidermidis NOT DETECTED NOT DETECTED Final   Staphylococcus lugdunensis NOT DETECTED NOT DETECTED Final   Streptococcus species NOT DETECTED NOT DETECTED Final   Streptococcus agalactiae NOT DETECTED NOT DETECTED Final   Streptococcus pneumoniae NOT DETECTED NOT DETECTED Final   Streptococcus pyogenes NOT DETECTED NOT DETECTED Final   A.calcoaceticus-baumannii NOT DETECTED NOT DETECTED Final   Bacteroides fragilis NOT DETECTED NOT DETECTED Final   Enterobacterales DETECTED (A) NOT DETECTED Final    Comment: Enterobacterales represent a large order of gram negative bacteria, not a single organism. CRITICAL RESULT CALLED TO, READ BACK BY AND VERIFIED WITH: PHARMD J.LEGGE AT 5621 ON 10/03/2023 BY T.SAAD.    Enterobacter cloacae complex NOT DETECTED NOT DETECTED Final   Escherichia coli DETECTED (A) NOT DETECTED Final    Comment: CRITICAL RESULT CALLED TO, READ BACK BY AND VERIFIED WITH: PHARMD J.LEGGE AT 3086 ON  10/03/2023 BY T.SAAD.    Klebsiella aerogenes NOT DETECTED NOT DETECTED Final   Klebsiella oxytoca NOT DETECTED NOT DETECTED Final   Klebsiella pneumoniae NOT DETECTED NOT DETECTED Final   Proteus species NOT DETECTED NOT DETECTED Final   Salmonella species NOT DETECTED NOT DETECTED Final   Serratia marcescens NOT DETECTED NOT DETECTED Final   Haemophilus influenzae NOT DETECTED NOT DETECTED Final   Neisseria meningitidis NOT DETECTED NOT DETECTED Final   Pseudomonas aeruginosa NOT DETECTED NOT DETECTED Final  Stenotrophomonas maltophilia NOT DETECTED NOT DETECTED Final   Candida albicans NOT DETECTED NOT DETECTED Final   Candida auris NOT DETECTED NOT DETECTED Final   Candida glabrata NOT DETECTED NOT DETECTED Final   Candida krusei NOT DETECTED NOT DETECTED Final   Candida parapsilosis NOT DETECTED NOT DETECTED Final   Candida tropicalis NOT DETECTED NOT DETECTED Final   Cryptococcus neoformans/gattii NOT DETECTED NOT DETECTED Final   CTX-M ESBL NOT DETECTED NOT DETECTED Final   Carbapenem resistance IMP NOT DETECTED NOT DETECTED Final   Carbapenem resistance KPC NOT DETECTED NOT DETECTED Final   Carbapenem resistance NDM NOT DETECTED NOT DETECTED Final   Carbapenem resist OXA 48 LIKE NOT DETECTED NOT DETECTED Final   Carbapenem resistance VIM NOT DETECTED NOT DETECTED Final    Comment: Performed at [facility], [address], [place], [place] [address]  Resp panel by RT-PCR (RSV, Flu A&B, Covid) Anterior Nasal Swab     Status: None   Collection Time: 10/02/23  3:45 PM   Specimen: Anterior Nasal Swab  Result Value Ref Range Status   SARS Coronavirus 2 by RT PCR NEGATIVE NEGATIVE Final    Comment: (NOTE) SARS-CoV-2 target nucleic acids are NOT DETECTED.  The SARS-CoV-2 RNA is generally detectable in upper respiratory specimens during the acute phase of infection. The lowest concentration of SARS-CoV-2 viral copies this assay can detect is 138 copies/mL. A negative  result does not preclude SARS-Cov-2 infection and should not be used as the sole basis for treatment or other patient management decisions. A negative result may occur with  improper specimen collection/handling, submission of specimen other than nasopharyngeal swab, presence of viral mutation(s) within the areas targeted by this assay, and inadequate number of viral copies(<138 copies/mL). A negative result must be combined with clinical observations, patient history, and epidemiological information. The expected result is Negative.  Fact Sheet for Patients:  BloggerCourse.com  Fact Sheet for Healthcare Providers:  SeriousBroker.it  This test is no t yet approved or cleared by the [place] FDA and  has been authorized for detection and/or diagnosis of SARS-CoV-2 by FDA under an Emergency Use Authorization (EUA). This EUA will remain  in effect (meaning this test can be used) for the duration of the COVID-19 declaration under Section 564(b)(1) of the Act, 21 U.S.C.section 360bbb-3(b)(1), unless the authorization is terminated  or revoked sooner.       Influenza A by PCR NEGATIVE NEGATIVE Final   Influenza B by PCR NEGATIVE NEGATIVE Final    Comment: (NOTE) The Xpert Xpress SARS-CoV-2/FLU/RSV plus assay is intended as an aid in the diagnosis of influenza from Nasopharyngeal swab specimens and should not be used as a sole basis for treatment. Nasal washings and aspirates are unacceptable for Xpert Xpress SARS-CoV-2/FLU/RSV testing.  Fact Sheet for Patients: BloggerCourse.com  Fact Sheet for Healthcare Providers: SeriousBroker.it  This test is not yet approved or cleared by the [place] FDA and has been authorized for detection and/or diagnosis of SARS-CoV-2 by FDA under an Emergency Use Authorization (EUA). This EUA will remain in effect (meaning this test can be used)  for the duration of the COVID-19 declaration under Section 564(b)(1) of the Act, 21 U.S.C. section 360bbb-3(b)(1), unless the authorization is terminated or revoked.     Resp Syncytial Virus by PCR NEGATIVE NEGATIVE Final    Comment: (NOTE) Fact Sheet for Patients: BloggerCourse.com  Fact Sheet for Healthcare Providers: SeriousBroker.it  This test is not yet approved or cleared by the [place] FDA  and has been authorized for detection and/or diagnosis of SARS-CoV-2 by FDA under an Emergency Use Authorization (EUA). This EUA will remain in effect (meaning this test can be used) for the duration of the COVID-19 declaration under Section 564(b)(1) of the Act, 21 U.S.C. section 360bbb-3(b)(1), unless the authorization is terminated or revoked.  Performed at [facility], [address], [place], [place] [address]   Culture, blood (Routine x 2)     Status: None (Preliminary result)   Collection Time: 10/02/23  4:13 PM   Specimen: BLOOD LEFT HAND  Result Value Ref Range Status   Specimen Description   Final    BLOOD LEFT HAND Performed at [facility], [address] [place], [place] [address]    Special Requests   Final    BOTTLES DRAWN AEROBIC ONLY Blood Culture results may not be optimal due to an inadequate volume of blood received in culture bottles Performed at [facility], [address] [place], [place] [address]    Culture  Setup Time   Final    GRAM NEGATIVE RODS AEROBIC BOTTLE ONLY CRITICAL VALUE NOTED.  VALUE IS CONSISTENT WITH PREVIOUSLY REPORTED AND CALLED VALUE. Performed at [facility], [address] [place], [place] [address]    Culture GRAM NEGATIVE RODS  Final   Report Status PENDING  Incomplete  Urine Culture     Status: Abnormal (Preliminary result)   Collection Time: 10/02/23  4:44 PM   Specimen: Urine, Clean Catch  Result Value Ref Range Status    Specimen Description   Final    URINE, CLEAN CATCH Performed at [facility], [address] [place], [place] [address]    Special Requests   Final    NONE Performed at [facility], [address], [place], [place] [address]    Culture >=100,000 COLONIES/mL ESCHERICHIA COLI (A)  Final   Report Status PENDING  Incomplete     Radiology Studies: CT ABDOMEN PELVIS W CONTRAST  Result Date: 10/02/2023 CLINICAL DATA:  Acute, non localized abdominal pain the pain began following an insulin injection 6 days ago and has increased with increasing abdominal distention. EXAM: CT ABDOMEN AND PELVIS WITH CONTRAST TECHNIQUE: Multidetector CT imaging of the abdomen and pelvis was performed using the standard protocol following bolus administration of intravenous contrast. RADIATION DOSE REDUCTION: This exam was performed according to the departmental dose-optimization program which includes automated exposure control, adjustment of the mA and/or kV according to patient size and/or use of iterative reconstruction technique. CONTRAST:  80mL OMNIPAQUE IOHEXOL 300 MG/ML  SOLN COMPARISON:  None Available. FINDINGS: Lower chest: Enlarged heart. Mild patchy ground-glass opacities in both lower lungs. Small amount of left lower lobe linear atelectasis or scarring. No pleural fluid. Hepatobiliary: No focal liver abnormality is seen. No gallstones, gallbladder wall thickening, or biliary dilatation. Pancreas: Unremarkable. No pancreatic ductal dilatation or surrounding inflammatory changes. Spleen: Normal in size without focal abnormality. Adrenals/Urinary Tract: Normal-appearing adrenal glands. Simple appearing upper pole right renal cyst. This does not need imaging follow-up. Minimal heterogeneity at both kidneys with minimal bilateral perinephric soft tissue stranding. Mild bilateral kidney and ureteral mucosal enhancement. No significant urine in the bladder. Stomach/Bowel:  Multiple colonic diverticula without evidence of diverticulitis. Normal-appearing appendix. Unremarkable stomach and small bowel. Vascular/Lymphatic: No significant vascular findings are present. No enlarged abdominal or pelvic lymph nodes. Reproductive: Uterus and bilateral adnexa are unremarkable. Other: Moderate-sized umbilical hernia containing fat. No free peritoneal fluid  or air. Musculoskeletal: Lumbar and lower thoracic spine degenerative changes. IMPRESSION: 1. Mild bilateral pyelonephritis and ureteritis without abscess. 2. Colonic diverticulosis. 3. Moderate-sized umbilical hernia containing fat. 4. Mild patchy ground-glass opacities in both lower lungs, nonspecific. This is nonspecific and most likely represents minimal interstitial pulmonary edema. 5. Cardiomegaly. Electronically Signed   By: Beckie Salts M.D.   On: 10/02/2023 17:42   DG Chest Port 1 View  Result Date: 10/02/2023 CLINICAL DATA:  Possible sepsis.  Fever. EXAM: PORTABLE CHEST 1 VIEW COMPARISON:  Chest x-ray and CT chest dated February 06, 2021. FINDINGS: Borderline cardiomegaly. Normal pulmonary vascularity. No focal consolidation, pleural effusion, or pneumothorax. No acute osseous abnormality. IMPRESSION: No active disease. Electronically Signed   By: Obie Dredge M.D.   On: 10/02/2023 16:30    Scheduled Meds:  heparin  5,000 Units Subcutaneous Q8H   insulin aspart  0-5 Units Subcutaneous QHS   insulin aspart  0-9 Units Subcutaneous TID WC   potassium chloride  40 mEq Oral Q4H   Continuous Infusions:  cefTRIAXone (ROCEPHIN)  IV Stopped (10/03/23 1556)     LOS: 2 days   Hughie Closs, MD [facility]  10/04/2023, 8:24 AM   *Please note that this is a verbal dictation therefore any spelling or grammatical errors are due to the "Dragon Medical One" system interpretation.  Please page via Amion and do not message via secure chat for urgent patient care matters. Secure chat can be used for non urgent patient  care matters.  How to contact the [facility] Attending or Consulting provider 7A - 7P or covering provider during after hours 7P -7A, for this patient?  Check the care team in [facility] and look for a) attending/consulting TRH provider listed and b) the [facility] team listed. Page or secure chat 7A-7P. Log into [email or web address] and use [place]'s universal password to access. If you do not have the password, please contact the hospital operator. Locate the [facility] provider you are looking for under [facility] and page to a number that you can be directly reached. If you still have difficulty reaching the provider, please page the [facility] (Director on Call) for the Hospitalists listed on amion for assistance.

## 2023-10-04 NOTE — TOC CM/SW Note (Signed)
Transition of Care [facility]) - Inpatient Brief Assessment   Patient Details  Name: Lori Bennett MRN: [id number] Date of Birth: 02/17/1956  Transition of Care [facility]) CM/SW Contact:    Howell Rucks, RN Phone Number: 10/04/2023, 11:52 AM   Clinical Narrative: Met with pt at bedside to introduce role of TOC/NCM and review for dc planning. Pt confirmed she has an established PCP and pharmacy, no current home care services or home DME, reports she independent with her care and feels safe return home, confirmed transportation is available at discharge. TOC Brief Assessment completed. No TOC needs identifed at this time.     Transition of Care Asessment: Insurance and Status: Insurance coverage has been reviewed Patient has primary care physician: Yes Home environment has been reviewed: resides in private residence Prior level of function:: Independent Prior/Current Home Services: No current home services Social Determinants of Health Reivew: SDOH reviewed no interventions necessary Readmission risk has been reviewed: Yes Transition of care needs: no transition of care needs at this time

## 2023-10-04 NOTE — Plan of Care (Signed)

## 2023-10-04 NOTE — Plan of Care (Signed)
  Problem: Clinical Measurements: Goal: Ability to maintain clinical measurements within normal limits will improve Outcome: Progressing   Problem: Nutrition: Goal: Adequate nutrition will be maintained Outcome: Progressing   Problem: Elimination: Goal: Will not experience complications related to urinary retention Outcome: Progressing   Problem: Pain Management: Goal: General experience of comfort will improve Outcome: Progressing

## 2023-10-05 DIAGNOSIS — N12 Tubulo-interstitial nephritis, not specified as acute or chronic: Secondary | ICD-10-CM | POA: Diagnosis not present

## 2023-10-05 DIAGNOSIS — B9620 Unspecified Escherichia coli [E. coli] as the cause of diseases classified elsewhere: Secondary | ICD-10-CM

## 2023-10-05 DIAGNOSIS — N390 Urinary tract infection, site not specified: Secondary | ICD-10-CM | POA: Diagnosis not present

## 2023-10-05 DIAGNOSIS — A419 Sepsis, unspecified organism: Secondary | ICD-10-CM | POA: Diagnosis not present

## 2023-10-05 LAB — CBC WITH DIFFERENTIAL/PLATELET
Abs Immature Granulocytes: 0.25 10*3/uL — ABNORMAL HIGH (ref 0.00–0.07)
Basophils Absolute: 0.1 10*3/uL (ref 0.0–0.1)
Basophils Relative: 1 %
Eosinophils Absolute: 0.2 10*3/uL (ref 0.0–0.5)
Eosinophils Relative: 1 %
HCT: 37.3 % (ref 36.0–46.0)
Hemoglobin: 11.5 g/dL — ABNORMAL LOW (ref 12.0–15.0)
Immature Granulocytes: 2 %
Lymphocytes Relative: 24 %
Lymphs Abs: 3.7 10*3/uL (ref 0.7–4.0)
MCH: 29 pg (ref 26.0–34.0)
MCHC: 30.8 g/dL (ref 30.0–36.0)
MCV: 94.2 fL (ref 80.0–100.0)
Monocytes Absolute: 1 10*3/uL (ref 0.1–1.0)
Monocytes Relative: 7 %
Neutro Abs: 10.2 10*3/uL — ABNORMAL HIGH (ref 1.7–7.7)
Neutrophils Relative %: 65 %
Platelets: 297 10*3/uL (ref 150–400)
RBC: 3.96 MIL/uL (ref 3.87–5.11)
RDW: 14 % (ref 11.5–15.5)
WBC: 15.4 10*3/uL — ABNORMAL HIGH (ref 4.0–10.5)
nRBC: 0 % (ref 0.0–0.2)

## 2023-10-05 LAB — CULTURE, BLOOD (ROUTINE X 2): Special Requests: ADEQUATE

## 2023-10-05 LAB — BASIC METABOLIC PANEL
Anion gap: 11 (ref 5–15)
BUN: 9 mg/dL (ref 8–23)
CO2: 24 mmol/L (ref 22–32)
Calcium: 9.3 mg/dL (ref 8.9–10.3)
Chloride: 105 mmol/L (ref 98–111)
Creatinine, Ser: 0.77 mg/dL (ref 0.44–1.00)
GFR, Estimated: >60 mL/min (ref >60)
Glucose, Bld: 126 mg/dL — ABNORMAL HIGH (ref 70–99)
Potassium: 3.4 mmol/L — ABNORMAL LOW (ref 3.5–5.1)
Sodium: 140 mmol/L (ref 135–145)

## 2023-10-05 LAB — GLUCOSE, CAPILLARY: Glucose-Capillary: 138 mg/dL — ABNORMAL HIGH (ref 70–99)

## 2023-10-05 MED ORDER — POTASSIUM CHLORIDE CRYS ER 20 MEQ PO TBCR
40.0000 meq | EXTENDED_RELEASE_TABLET | Freq: Once | ORAL | Status: AC
  Administered 2023-10-05: 40 meq via ORAL
  Filled 2023-10-05: qty 2

## 2023-10-05 MED ORDER — LEVOFLOXACIN 750 MG PO TABS
750.0000 mg | ORAL_TABLET | Freq: Every day | ORAL | 0 refills | Status: AC
Start: 2023-10-06 — End: 2023-10-12

## 2023-10-05 MED ORDER — SODIUM CHLORIDE 0.9 % IV SOLN
2.0000 g | INTRAVENOUS | Status: DC
  Administered 2023-10-05: 2 g via INTRAVENOUS
  Filled 2023-10-05: qty 20

## 2023-10-05 MED ORDER — AMLODIPINE BESYLATE 5 MG PO TABS: 5.0000 mg | ORAL_TABLET | Freq: Every day | ORAL | Status: AC

## 2023-10-05 NOTE — Discharge Instructions (Signed)

## 2023-10-05 NOTE — Care Management Important Message (Signed)
Important Message  Patient Details IM Letter given. Name: Lori Bennett MRN: [id number] Date of Birth: Jan 01, 1956   Important Message Given:  Yes - Medicare IM     Caren Macadam 10/05/2023, 11:20 AM

## 2023-10-05 NOTE — Discharge Summary (Signed)
Physician Discharge Summary  Peri Lori Bennett MVH:[id number] DOB: 08-Jan-1956  PCP: Melida Quitter, MD  Admitted from: Home Discharged to: Home  Admit date: 10/02/2023 Discharge date: 10/05/2023  Recommendations for Outpatient Follow-up:    Follow-up Information     Melida Quitter, MD. Schedule an appointment as soon as possible for a visit in 1 week(s).   Specialty: Internal Medicine Why: To be seen with repeat labs (CBC & CMP). Contact information: [address] [place] [address] [phone]                  Home Health: None    Equipment/Devices: None    Discharge Condition: Improved and stable.   Code Status: Full Code Diet recommendation:  Discharge Diet Orders (From admission, onward)     Start     Ordered   10/05/23 0000  Diet - low sodium heart healthy        10/05/23 1035   10/05/23 0000  Diet Carb Modified        10/05/23 1035             Discharge Diagnoses:  Principal Problem:   Sepsis due to urinary tract infection (HCC) Active Problems:   AKI (acute kidney injury) (HCC)   DM2 (diabetes mellitus, type 2) (HCC)   Hypertension associated with diabetes (HCC)   Hyperlipidemia associated with type 2 diabetes mellitus (HCC)   Brief Summary: Brief Narrative:  Lori Bennett is a 67 y.o. female with medical history significant for T2DM, HTN, HLD who presented to the ED for evaluation of abdominal pain/right flank pain for 3 days along with dysuria and urgency, was admitted with sepsis secondary to E. coli bilateral pyelonephritis and bacteremia.  Improved.   Assessment & Plan:  Sepsis due to E. coli bilateral pyelonephritis/ureteritis and bacteremia, POA: Meets sepsis criteria based on fever, tachypnea, leukocytosis, CT suggestive of bilateral pyelonephritis/ureteritis, no evidence of obstruction or abscess.  UA consistent with UTI.  Lactic acid normal.   2 sets of blood culture and urine culture both grew E. coli, with similar resistance  patterns, resistant to ampicillin, Bactrim, Unasyn, blood culture specimen will also with intermediate sensitivity to cefazolin.  All sensitive to ceftriaxone and ciprofloxacin among others. Clinically has done well.  Has defervesced with no fevers for greater than 48 hours, leukocytosis improving, down to 15.4 from 21.7 on admission.  No pain. Patient will complete 4 days of IV ceftriaxone prior to discharge and has communicated with infectious disease MD on-call, will discharge on levofloxacin 750 Mg every 24 hours to complete a total 10-day course.   Acute kidney injury: Creatinine 1.63 on admit compared to baseline 0.6-0.7.  Secondary to sepsis.  Now resolved with IV fluids.     Hypokalemia:  Replacing prior to DC.  Outpatient follow-up.   Type 2 diabetes: Hemoglobin A1c 7.2.  Held [place] and dulaglutide.  Placed on SSI.  Blood sugar labile. At discharge, resume dulaglutide but hold Jardiance until completion of antibiotic course.   Hypertension: Antihypertensives on hold due to hypotension on arrival.  Blood pressures improved and starting to gradually creep up Resume home antihypertensives at discharge with close outpatient follow-up.   Hyperlipidemia/elevated LFTs: Elevated LFTs, likely secondary to sepsis.  Minimal elevation of ALT and AST, 48/53 with normal total bilirubin.  Resume statin at discharge.   Aspiration pneumonia/pulmonary edema ruled out: CT abdomen shows mild patchy groundglass opacities in both lower lungs but also says possible vascular congestion.  Patient denies any shortness of breath.  Clinically she does not appear to have pneumonia symptoms.  She has been placed on 3 L of oxygen just for comfort but she was saturating 92% on room air.  Currently she is on room air.  Acute hypoxic respiratory failure is ruled out. Provide and encourage incentive spirometry.   Chronic diastolic congestive heart failure: Last echo available in the chart was from April 2022 which  showed normal ejection fraction but grade 1 diastolic dysfunction.  Patient denies shortness of breath, no crackles on examination.  Doubt acute exacerbation of CHF.     Body mass index is 28.34 kg/m.  Consultants:  None  Procedures:  None   Discharge Instructions  Discharge Instructions     Call MD for:  difficulty breathing, headache or visual disturbances   Complete by: As directed    Call MD for:  extreme fatigue   Complete by: As directed    Call MD for:  persistant dizziness or light-headedness   Complete by: As directed    Call MD for:  persistant nausea and vomiting   Complete by: As directed    Call MD for:  severe uncontrolled pain   Complete by: As directed    Call MD for:  temperature >100.4   Complete by: As directed    Diet - low sodium heart healthy   Complete by: As directed    Diet Carb Modified   Complete by: As directed    Increase activity slowly   Complete by: As directed         Medication List     PAUSE taking these medications    JARDIANCE PO Wait to take this until: October 12, 2023 Take 1 tablet by mouth daily.       TAKE these medications    acetaminophen 500 MG tablet Commonly known as: TYLENOL Take 2 tablets (1,000 mg total) by mouth every 6 (six) hours as needed for mild pain or moderate pain.   amLODipine 5 MG tablet Commonly known as: NORVASC Take 1 tablet (5 mg total) by mouth daily.   atorvastatin 20 MG tablet Commonly known as: LIPITOR Take 20 mg by mouth daily.   carvedilol 12.5 MG tablet Commonly known as: COREG Take 1 tablet (12.5 mg total) by mouth 2 (two) times daily with a meal. NEED OV. What changed: additional instructions   Dulaglutide 1.5 MG/0.5ML Soaj Inject 1.5 mg into the skin every 7 (seven) days. Tuesday's ,  in bedtime   gabapentin 300 MG capsule Commonly known as: NEURONTIN Take 300 mg by mouth at bedtime as needed (pain).   irbesartan 150 MG tablet Commonly known as: AVAPRO Take 150 mg  by mouth daily.   levofloxacin 750 MG tablet Commonly known as: Levaquin Take 1 tablet (750 mg total) by mouth daily for 6 days. Start taking on: October 06, 2023   Voltaren Arthritis Pain 1 % Gel Generic drug: diclofenac Sodium Apply topically as needed.       Allergies  Allergen Reactions   Ace Inhibitors Cough   Clonidine Derivatives Other (See Comments)    Severe dry mouth and lethargy.      Procedures/Studies: CT ABDOMEN PELVIS W CONTRAST Result Date: 10/02/2023 CLINICAL DATA:  Acute, non localized abdominal pain the pain began following an insulin injection 6 days ago and has increased with increasing abdominal distention. EXAM: CT ABDOMEN AND PELVIS WITH CONTRAST TECHNIQUE: Multidetector CT imaging of the abdomen and pelvis was performed using the standard protocol following bolus administration of intravenous contrast.  RADIATION DOSE REDUCTION: This exam was performed according to the departmental dose-optimization program which includes automated exposure control, adjustment of the mA and/or kV according to patient size and/or use of iterative reconstruction technique. CONTRAST:  80mL OMNIPAQUE IOHEXOL 300 MG/ML  SOLN COMPARISON:  None Available. FINDINGS: Lower chest: Enlarged heart. Mild patchy ground-glass opacities in both lower lungs. Small amount of left lower lobe linear atelectasis or scarring. No pleural fluid. Hepatobiliary: No focal liver abnormality is seen. No gallstones, gallbladder wall thickening, or biliary dilatation. Pancreas: Unremarkable. No pancreatic ductal dilatation or surrounding inflammatory changes. Spleen: Normal in size without focal abnormality. Adrenals/Urinary Tract: Normal-appearing adrenal glands. Simple appearing upper pole right renal cyst. This does not need imaging follow-up. Minimal heterogeneity at both kidneys with minimal bilateral perinephric soft tissue stranding. Mild bilateral kidney and ureteral mucosal enhancement. No significant  urine in the bladder. Stomach/Bowel: Multiple colonic diverticula without evidence of diverticulitis. Normal-appearing appendix. Unremarkable stomach and small bowel. Vascular/Lymphatic: No significant vascular findings are present. No enlarged abdominal or pelvic lymph nodes. Reproductive: Uterus and bilateral adnexa are unremarkable. Other: Moderate-sized umbilical hernia containing fat. No free peritoneal fluid or air. Musculoskeletal: Lumbar and lower thoracic spine degenerative changes. IMPRESSION: 1. Mild bilateral pyelonephritis and ureteritis without abscess. 2. Colonic diverticulosis. 3. Moderate-sized umbilical hernia containing fat. 4. Mild patchy ground-glass opacities in both lower lungs, nonspecific. This is nonspecific and most likely represents minimal interstitial pulmonary edema. 5. Cardiomegaly. Electronically Signed   By: Beckie Salts M.D.   On: 10/02/2023 17:42   DG Chest Port 1 View Result Date: 10/02/2023 CLINICAL DATA:  Possible sepsis.  Fever. EXAM: PORTABLE CHEST 1 VIEW COMPARISON:  Chest x-ray and CT chest dated February 06, 2021. FINDINGS: Borderline cardiomegaly. Normal pulmonary vascularity. No focal consolidation, pleural effusion, or pneumothorax. No acute osseous abnormality. IMPRESSION: No active disease. Electronically Signed   By: Obie Dredge M.D.   On: 10/02/2023 16:30      Subjective: Feels well.  Denies complaints.  Anxious to go home.  No pain.  Tolerating diet.  Passing flatus.  No BM.  Ambulating steadily to bathroom and back per her report.  Discharge Exam:  Vitals:   10/04/23 0337 10/04/23 1137 10/04/23 2059 10/05/23 0436  BP: 127/66 124/80 136/72 (!) 131/93  Pulse: 90 87 95 89  Resp: 18 16 20 18   Temp: 98.9 F (37.2 C) 98.6 F (37 C) 98.5 F (36.9 C) 99 F (37.2 C)  TempSrc: Oral Oral Oral Oral  SpO2: 94% 98% 97% 95%  Weight:      Height:        General: Pleasant middle-age female, moderately built and nourished lying comfortably supine in  bed without distress.  Oral mucosa moist. Cardiovascular: S1 & S2 heard, RRR, S1/S2 +. No murmurs, rubs, gallops or clicks. No JVD or pedal edema.  Telemetry personally reviewed: Sinus rhythm. Respiratory: Clear to auscultation without wheezing, rhonchi or crackles. No increased work of breathing. Abdominal:  Non distended, non tender & soft. No organomegaly or masses appreciated. Normal bowel sounds heard. CNS: Alert and oriented. No focal deficits. Extremities: no edema, no cyanosis    The results of significant diagnostics from this hospitalization (including imaging, microbiology, ancillary and laboratory) are listed below for reference.     Microbiology: Recent Results (from the past 240 hours)  Culture, blood (Routine x 2)     Status: Abnormal   Collection Time: 10/02/23  3:00 PM   Specimen: BLOOD  Result Value Ref Range Status   Specimen  Description   Final    BLOOD RIGHT ANTECUBITAL Performed at [facility], [address], [place], [place] [address]    Special Requests   Final    BOTTLES DRAWN AEROBIC AND ANAEROBIC Blood Culture adequate volume Performed at [facility], [address] [place], [place] [address]    Culture  Setup Time   Final    GRAM NEGATIVE RODS ANAEROBIC BOTTLE ONLY CRITICAL RESULT CALLED TO, READ BACK BY AND VERIFIED WITH: PHARMD J.LEGGE AT 2956 ON 10/03/2023 BY T.SAAD.    Culture (A)  Final    ESCHERICHIA COLI SUSCEPTIBILITIES PERFORMED ON PREVIOUS CULTURE WITHIN THE LAST 5 DAYS. Performed at [facility], [address], [place], [place] [address]    Report Status 10/05/2023 FINAL  Final   Organism ID, Bacteria ESCHERICHIA COLI  Final   Organism ID, Bacteria ESCHERICHIA COLI  Final      Susceptibility   Escherichia coli - MIC*    AMPICILLIN >=32 RESISTANT Resistant     CEFEPIME <=0.12 SENSITIVE Sensitive     CEFTAZIDIME <=1 SENSITIVE Sensitive     CEFTRIAXONE <=0.25 SENSITIVE Sensitive      CIPROFLOXACIN <=0.25 SENSITIVE Sensitive     GENTAMICIN <=1 SENSITIVE Sensitive     IMIPENEM <=0.25 SENSITIVE Sensitive     TRIMETH/SULFA >=320 RESISTANT Resistant     AMPICILLIN/SULBACTAM >=32 RESISTANT Resistant     PIP/TAZO <=4 SENSITIVE Sensitive ug/mL    * ESCHERICHIA COLI  Blood Culture ID Panel (Reflexed)     Status: Abnormal   Collection Time: 10/02/23  3:00 PM  Result Value Ref Range Status   Enterococcus faecalis NOT DETECTED NOT DETECTED Final   Enterococcus Faecium NOT DETECTED NOT DETECTED Final   Listeria monocytogenes NOT DETECTED NOT DETECTED Final   Staphylococcus species NOT DETECTED NOT DETECTED Final   Staphylococcus aureus (BCID) NOT DETECTED NOT DETECTED Final   Staphylococcus epidermidis NOT DETECTED NOT DETECTED Final   Staphylococcus lugdunensis NOT DETECTED NOT DETECTED Final   Streptococcus species NOT DETECTED NOT DETECTED Final   Streptococcus agalactiae NOT DETECTED NOT DETECTED Final   Streptococcus pneumoniae NOT DETECTED NOT DETECTED Final   Streptococcus pyogenes NOT DETECTED NOT DETECTED Final   A.calcoaceticus-baumannii NOT DETECTED NOT DETECTED Final   Bacteroides fragilis NOT DETECTED NOT DETECTED Final   Enterobacterales DETECTED (A) NOT DETECTED Final    Comment: Enterobacterales represent a large order of gram negative bacteria, not a single organism. CRITICAL RESULT CALLED TO, READ BACK BY AND VERIFIED WITH: PHARMD J.LEGGE AT 6578 ON 10/03/2023 BY T.SAAD.    Enterobacter cloacae complex NOT DETECTED NOT DETECTED Final   Escherichia coli DETECTED (A) NOT DETECTED Final    Comment: CRITICAL RESULT CALLED TO, READ BACK BY AND VERIFIED WITH: PHARMD J.LEGGE AT 4696 ON 10/03/2023 BY T.SAAD.    Klebsiella aerogenes NOT DETECTED NOT DETECTED Final   Klebsiella oxytoca NOT DETECTED NOT DETECTED Final   Klebsiella pneumoniae NOT DETECTED NOT DETECTED Final   Proteus species NOT DETECTED NOT DETECTED Final   Salmonella species NOT DETECTED NOT  DETECTED Final   Serratia marcescens NOT DETECTED NOT DETECTED Final   Haemophilus influenzae NOT DETECTED NOT DETECTED Final   Neisseria meningitidis NOT DETECTED NOT DETECTED Final   Pseudomonas aeruginosa NOT DETECTED NOT DETECTED Final   Stenotrophomonas maltophilia NOT DETECTED NOT DETECTED Final   Candida albicans NOT DETECTED NOT DETECTED Final   Candida auris NOT DETECTED NOT DETECTED Final   Candida glabrata NOT DETECTED NOT DETECTED  Final   Candida krusei NOT DETECTED NOT DETECTED Final   Candida parapsilosis NOT DETECTED NOT DETECTED Final   Candida tropicalis NOT DETECTED NOT DETECTED Final   Cryptococcus neoformans/gattii NOT DETECTED NOT DETECTED Final   CTX-M ESBL NOT DETECTED NOT DETECTED Final   Carbapenem resistance IMP NOT DETECTED NOT DETECTED Final   Carbapenem resistance KPC NOT DETECTED NOT DETECTED Final   Carbapenem resistance NDM NOT DETECTED NOT DETECTED Final   Carbapenem resist OXA 48 LIKE NOT DETECTED NOT DETECTED Final   Carbapenem resistance VIM NOT DETECTED NOT DETECTED Final    Comment: Performed at [facility], [address] [place], [place] [address]  Resp panel by RT-PCR (RSV, Flu A&B, Covid) Anterior Nasal Swab     Status: None   Collection Time: 10/02/23  3:45 PM   Specimen: Anterior Nasal Swab  Result Value Ref Range Status   SARS Coronavirus 2 by RT PCR NEGATIVE NEGATIVE Final    Comment: (NOTE) SARS-CoV-2 target nucleic acids are NOT DETECTED.  The SARS-CoV-2 RNA is generally detectable in upper respiratory specimens during the acute phase of infection. The lowest concentration of SARS-CoV-2 viral copies this assay can detect is 138 copies/mL. A negative result does not preclude SARS-Cov-2 infection and should not be used as the sole basis for treatment or other patient management decisions. A negative result may occur with  improper specimen collection/handling, submission of specimen other than nasopharyngeal swab, presence of  viral mutation(s) within the areas targeted by this assay, and inadequate number of viral copies(<138 copies/mL). A negative result must be combined with clinical observations, patient history, and epidemiological information. The expected result is Negative.  Fact Sheet for Patients:  BloggerCourse.com  Fact Sheet for Healthcare Providers:  SeriousBroker.it  This test is no t yet approved or cleared by the [place] FDA and  has been authorized for detection and/or diagnosis of SARS-CoV-2 by FDA under an Emergency Use Authorization (EUA). This EUA will remain  in effect (meaning this test can be used) for the duration of the COVID-19 declaration under Section 564(b)(1) of the Act, 21 U.S.C.section 360bbb-3(b)(1), unless the authorization is terminated  or revoked sooner.       Influenza A by PCR NEGATIVE NEGATIVE Final   Influenza B by PCR NEGATIVE NEGATIVE Final    Comment: (NOTE) The Xpert Xpress SARS-CoV-2/FLU/RSV plus assay is intended as an aid in the diagnosis of influenza from Nasopharyngeal swab specimens and should not be used as a sole basis for treatment. Nasal washings and aspirates are unacceptable for Xpert Xpress SARS-CoV-2/FLU/RSV testing.  Fact Sheet for Patients: BloggerCourse.com  Fact Sheet for Healthcare Providers: SeriousBroker.it  This test is not yet approved or cleared by the [place] FDA and has been authorized for detection and/or diagnosis of SARS-CoV-2 by FDA under an Emergency Use Authorization (EUA). This EUA will remain in effect (meaning this test can be used) for the duration of the COVID-19 declaration under Section 564(b)(1) of the Act, 21 U.S.C. section 360bbb-3(b)(1), unless the authorization is terminated or revoked.     Resp Syncytial Virus by PCR NEGATIVE NEGATIVE Final    Comment: (NOTE) Fact Sheet for  Patients: BloggerCourse.com  Fact Sheet for Healthcare Providers: SeriousBroker.it  This test is not yet approved or cleared by the [place] FDA and has been authorized for detection and/or diagnosis of SARS-CoV-2 by FDA under an Emergency Use Authorization (EUA). This EUA will remain in effect (meaning this test can be used) for the duration  of the COVID-19 declaration under Section 564(b)(1) of the Act, 21 U.S.C. section 360bbb-3(b)(1), unless the authorization is terminated or revoked.  Performed at [facility], [address] [place], [place] [address]   Culture, blood (Routine x 2)     Status: Abnormal   Collection Time: 10/02/23  4:13 PM   Specimen: BLOOD LEFT HAND  Result Value Ref Range Status   Specimen Description   Final    BLOOD LEFT HAND Performed at [facility], [address], [place], [place] [address]    Special Requests   Final    BOTTLES DRAWN AEROBIC ONLY Blood Culture results may not be optimal due to an inadequate volume of blood received in culture bottles Performed at [facility], [address], [place], [place] [address]    Culture  Setup Time   Final    GRAM NEGATIVE RODS AEROBIC BOTTLE ONLY CRITICAL VALUE NOTED.  VALUE IS CONSISTENT WITH PREVIOUSLY REPORTED AND CALLED VALUE. Performed at [facility], [address] [place], [place] [address]    Culture ESCHERICHIA COLI (A)  Final   Report Status 10/05/2023 FINAL  Final   Organism ID, Bacteria ESCHERICHIA COLI  Final   Organism ID, Bacteria ESCHERICHIA COLI  Final      Susceptibility   Escherichia coli - KIRBY BAUER*    CEFAZOLIN INTERMEDIATE Intermediate    Escherichia coli - MIC*    AMPICILLIN >=32 RESISTANT Resistant     CEFEPIME <=0.12 SENSITIVE Sensitive     CEFTAZIDIME <=1 SENSITIVE Sensitive     CEFTRIAXONE <=0.25 SENSITIVE Sensitive     CIPROFLOXACIN <=0.25 SENSITIVE Sensitive      GENTAMICIN <=1 SENSITIVE Sensitive     IMIPENEM <=0.25 SENSITIVE Sensitive     TRIMETH/SULFA >=320 RESISTANT Resistant     AMPICILLIN/SULBACTAM >=32 RESISTANT Resistant     PIP/TAZO <=4 SENSITIVE Sensitive ug/mL    * ESCHERICHIA COLI    ESCHERICHIA COLI  Urine Culture     Status: Abnormal   Collection Time: 10/02/23  4:44 PM   Specimen: Urine, Clean Catch  Result Value Ref Range Status   Specimen Description   Final    URINE, CLEAN CATCH Performed at [facility], [address], [place], [place] [address]    Special Requests   Final    NONE Performed at [facility], [address] [place], [place] [address]    Culture >=100,000 COLONIES/mL ESCHERICHIA COLI (A)  Final   Report Status 10/04/2023 FINAL  Final   Organism ID, Bacteria ESCHERICHIA COLI (A)  Final      Susceptibility   Escherichia coli - MIC*    AMPICILLIN >=32 RESISTANT Resistant     CEFAZOLIN <=4 SENSITIVE Sensitive     CEFEPIME <=0.12 SENSITIVE Sensitive     CEFTRIAXONE <=0.25 SENSITIVE Sensitive     CIPROFLOXACIN <=0.25 SENSITIVE Sensitive     GENTAMICIN <=1 SENSITIVE Sensitive     IMIPENEM <=0.25 SENSITIVE Sensitive     NITROFURANTOIN <=16 SENSITIVE Sensitive     TRIMETH/SULFA >=320 RESISTANT Resistant     AMPICILLIN/SULBACTAM >=32 RESISTANT Resistant     PIP/TAZO <=4 SENSITIVE Sensitive ug/mL    * >=100,000 COLONIES/mL ESCHERICHIA COLI     Labs: CBC: Recent Labs  Lab 10/02/23 1451 10/03/23 0600 10/04/23 0349 10/05/23 0356  WBC 19.1* 21.7* 20.4* 15.4*  NEUTROABS 15.2*  --  15.1* 10.2*  HGB 12.1 10.2* 10.3* 11.5*  HCT 36.0 32.4* 33.3* 37.3  MCV 90.0 93.6 92.2 94.2  PLT 220 210 275 297    Basic Metabolic Panel: Recent Labs  Lab 10/02/23 1451 10/03/23 0600 10/04/23 0349 10/05/23 0356  NA 132* 141 143 140  K 3.7 3.2* 3.0* 3.4*  CL 98 108 109 105  CO2 22 22 25 24   GLUCOSE 208* 167* 124* 126*  BUN 27* 19 10 9   CREATININE 1.63* 1.11* 0.86 0.77   CALCIUM 8.7* 7.8* 9.2 9.3    Liver Function Tests: Recent Labs  Lab 10/02/23 1451 10/03/23 0600 10/04/23 0349  AST 67* 53* 48*  ALT 75* 57* 53*  ALKPHOS 204* 205* 237*  BILITOT 2.3* 1.7* 0.7  PROT 7.5 6.4* 7.4  ALBUMIN 3.3* 2.9* 2.9*    CBG: Recent Labs  Lab 10/04/23 0720 10/04/23 1138 10/04/23 1634 10/04/23 2100 10/05/23 0733  GLUCAP 145* 154* 182* 189* 138*    Hgb A1c Recent Labs    10/03/23 1141  HGBA1C 7.2*     Urinalysis    Component Value Date/Time   COLORURINE AMBER (A) 10/02/2023 1604   APPEARANCEUR CLOUDY (A) 10/02/2023 1604   LABSPEC 1.016 10/02/2023 1604   PHURINE 5.0 10/02/2023 1604   GLUCOSEU >=500 (A) 10/02/2023 1604   HGBUR MODERATE (A) 10/02/2023 1604   BILIRUBINUR NEGATIVE 10/02/2023 1604   KETONESUR NEGATIVE 10/02/2023 1604   PROTEINUR 100 (A) 10/02/2023 1604   NITRITE NEGATIVE 10/02/2023 1604   LEUKOCYTESUR MODERATE (A) 10/02/2023 1604      Time coordinating discharge: 35 minutes  SIGNED:  Marcellus Scott, MD,  FACP, [facility], [facility], [facility]   [facility] Advisor [place]     To contact the attending provider between 7A-7P or the covering provider during after hours 7P-7A, please log into the web site [email or web address] and access using universal [place] password for that web site. If you do not have the password, please call the hospital operator.

## 2023-10-10 DIAGNOSIS — I110 Hypertensive heart disease with heart failure: Secondary | ICD-10-CM | POA: Diagnosis not present

## 2023-10-10 DIAGNOSIS — R6520 Severe sepsis without septic shock: Secondary | ICD-10-CM | POA: Diagnosis not present

## 2023-10-10 DIAGNOSIS — Z23 Encounter for immunization: Secondary | ICD-10-CM | POA: Diagnosis not present

## 2023-10-10 DIAGNOSIS — N10 Acute tubulo-interstitial nephritis: Secondary | ICD-10-CM | POA: Diagnosis not present

## 2023-10-10 DIAGNOSIS — N179 Acute kidney failure, unspecified: Secondary | ICD-10-CM | POA: Diagnosis not present

## 2023-10-10 DIAGNOSIS — E1142 Type 2 diabetes mellitus with diabetic polyneuropathy: Secondary | ICD-10-CM | POA: Diagnosis not present

## 2023-10-10 DIAGNOSIS — I5032 Chronic diastolic (congestive) heart failure: Secondary | ICD-10-CM | POA: Diagnosis not present

## 2023-10-10 DIAGNOSIS — E785 Hyperlipidemia, unspecified: Secondary | ICD-10-CM | POA: Diagnosis not present

## 2023-10-10 DIAGNOSIS — A4151 Sepsis due to Escherichia coli [E. coli]: Secondary | ICD-10-CM | POA: Diagnosis not present

## 2023-11-02 DIAGNOSIS — E1142 Type 2 diabetes mellitus with diabetic polyneuropathy: Secondary | ICD-10-CM | POA: Diagnosis not present

## 2023-11-02 DIAGNOSIS — I10 Essential (primary) hypertension: Secondary | ICD-10-CM | POA: Diagnosis not present

## 2023-11-02 DIAGNOSIS — E785 Hyperlipidemia, unspecified: Secondary | ICD-10-CM | POA: Diagnosis not present

## 2023-11-06 DIAGNOSIS — M545 Low back pain, unspecified: Secondary | ICD-10-CM | POA: Diagnosis not present

## 2023-11-06 DIAGNOSIS — E1122 Type 2 diabetes mellitus with diabetic chronic kidney disease: Secondary | ICD-10-CM | POA: Diagnosis not present

## 2023-11-06 DIAGNOSIS — Z809 Family history of malignant neoplasm, unspecified: Secondary | ICD-10-CM | POA: Diagnosis not present

## 2023-11-06 DIAGNOSIS — Z008 Encounter for other general examination: Secondary | ICD-10-CM | POA: Diagnosis not present

## 2023-11-06 DIAGNOSIS — Z5948 Other specified lack of adequate food: Secondary | ICD-10-CM | POA: Diagnosis not present

## 2023-11-06 DIAGNOSIS — Z7985 Long-term (current) use of injectable non-insulin antidiabetic drugs: Secondary | ICD-10-CM | POA: Diagnosis not present

## 2023-11-06 DIAGNOSIS — N189 Chronic kidney disease, unspecified: Secondary | ICD-10-CM | POA: Diagnosis not present

## 2023-11-06 DIAGNOSIS — E1165 Type 2 diabetes mellitus with hyperglycemia: Secondary | ICD-10-CM | POA: Diagnosis not present

## 2023-11-06 DIAGNOSIS — Z833 Family history of diabetes mellitus: Secondary | ICD-10-CM | POA: Diagnosis not present

## 2023-11-06 DIAGNOSIS — M1990 Unspecified osteoarthritis, unspecified site: Secondary | ICD-10-CM | POA: Diagnosis not present

## 2023-11-06 DIAGNOSIS — Z8249 Family history of ischemic heart disease and other diseases of the circulatory system: Secondary | ICD-10-CM | POA: Diagnosis not present

## 2023-11-06 DIAGNOSIS — E1142 Type 2 diabetes mellitus with diabetic polyneuropathy: Secondary | ICD-10-CM | POA: Diagnosis not present

## 2023-11-06 DIAGNOSIS — Z5986 Financial insecurity: Secondary | ICD-10-CM | POA: Diagnosis not present

## 2023-11-09 DIAGNOSIS — I110 Hypertensive heart disease with heart failure: Secondary | ICD-10-CM | POA: Diagnosis not present

## 2023-11-09 DIAGNOSIS — I517 Cardiomegaly: Secondary | ICD-10-CM | POA: Diagnosis not present

## 2023-11-09 DIAGNOSIS — M549 Dorsalgia, unspecified: Secondary | ICD-10-CM | POA: Diagnosis not present

## 2023-11-09 DIAGNOSIS — Z23 Encounter for immunization: Secondary | ICD-10-CM | POA: Diagnosis not present

## 2023-11-09 DIAGNOSIS — Z0000 Encounter for general adult medical examination without abnormal findings: Secondary | ICD-10-CM | POA: Diagnosis not present

## 2023-11-09 DIAGNOSIS — Z1331 Encounter for screening for depression: Secondary | ICD-10-CM | POA: Diagnosis not present

## 2023-11-09 DIAGNOSIS — Z1339 Encounter for screening examination for other mental health and behavioral disorders: Secondary | ICD-10-CM | POA: Diagnosis not present

## 2023-11-09 DIAGNOSIS — E1142 Type 2 diabetes mellitus with diabetic polyneuropathy: Secondary | ICD-10-CM | POA: Diagnosis not present

## 2023-11-09 DIAGNOSIS — E785 Hyperlipidemia, unspecified: Secondary | ICD-10-CM | POA: Diagnosis not present

## 2023-11-09 DIAGNOSIS — Z860100 Personal history of colon polyps, unspecified: Secondary | ICD-10-CM | POA: Diagnosis not present

## 2023-11-09 DIAGNOSIS — I5032 Chronic diastolic (congestive) heart failure: Secondary | ICD-10-CM | POA: Diagnosis not present

## 2023-12-07 DIAGNOSIS — R102 Pelvic and perineal pain: Secondary | ICD-10-CM | POA: Diagnosis not present

## 2023-12-07 DIAGNOSIS — Z6828 Body mass index (BMI) 28.0-28.9, adult: Secondary | ICD-10-CM | POA: Diagnosis not present

## 2023-12-07 DIAGNOSIS — Z124 Encounter for screening for malignant neoplasm of cervix: Secondary | ICD-10-CM | POA: Diagnosis not present

## 2023-12-07 DIAGNOSIS — Z01419 Encounter for gynecological examination (general) (routine) without abnormal findings: Secondary | ICD-10-CM | POA: Diagnosis not present

## 2023-12-14 DIAGNOSIS — I110 Hypertensive heart disease with heart failure: Secondary | ICD-10-CM | POA: Diagnosis not present

## 2023-12-14 DIAGNOSIS — E1142 Type 2 diabetes mellitus with diabetic polyneuropathy: Secondary | ICD-10-CM | POA: Diagnosis not present

## 2023-12-14 DIAGNOSIS — E785 Hyperlipidemia, unspecified: Secondary | ICD-10-CM | POA: Diagnosis not present

## 2023-12-19 ENCOUNTER — Other Ambulatory Visit: Payer: Self-pay | Admitting: Student-PharmD

## 2023-12-19 NOTE — Progress Notes (Deleted)
  12/19/2023 Name: Onesty Clair MRN: [id number] DOB: [birth year]    Montzerrat Brunell is a 68 y.o. year old female who presented for a telephone visit.   They were referred to the pharmacist by their PCP for assistance in managing diabetes.    Subjective:  Care Team: Primary Care Provider: Melida Quitter, MD ; Next Scheduled Visit: 4   Medication Access/Adherence  Current Pharmacy:  [facility] [address] ([place]), [place] - [address] [place] ([place]) [place] [address] Phone: [phone] Fax: [phone]  [facility] [address] [place] [address] Phone: [phone] Fax: [phone]   Patient reports affordability concerns with their medications: {YES/NO:21197} Patient reports access/transportation concerns to their pharmacy: {YES/NO:21197} Patient reports adherence concerns with their medications:  {YES/NO:21197} ***   {Pharmacy S/O Choices:26420}   Objective:  Lab Results  Component Value Date   HGBA1C 7.2 (H) 10/03/2023    Lab Results  Component Value Date   CREATININE 0.77 10/05/2023   BUN 9 10/05/2023   NA 140 10/05/2023   K 3.4 (L) 10/05/2023   CL 105 10/05/2023   CO2 24 10/05/2023    Lab Results  Component Value Date   CHOL 157 05/18/2017   HDL 41 05/18/2017   LDLCALC 81 05/18/2017   TRIG 173 (H) 05/18/2017   CHOLHDL 3.8 05/18/2017    Medications Reviewed Today     Reviewed by Roe Coombs, [facility] (Pharmacist) on 12/19/23 at 1005  Med List Status: <None>   Medication Order Taking? Sig Documenting Provider Last Dose Status Informant  acetaminophen (TYLENOL) 500 MG tablet [id number] Yes Take 2 tablets (1,000 mg total) by mouth every 6 (six) hours as needed for mild pain or moderate pain. Jenne Pane, PA-C Taking Active Self, Pharmacy Records  amLODipine (NORVASC) 5 MG tablet [id number] Yes Take 1 tablet (5 mg total) by mouth daily. Elease Etienne, MD Taking Active    atorvastatin (LIPITOR) 20 MG tablet [id number] Yes Take 20 mg by mouth daily. [provider] Taking Active Self, Pharmacy Records  carvedilol (COREG) 12.5 MG tablet [id number] Yes Take 1 tablet (12.5 mg total) by mouth 2 (two) times daily with a meal. NEED OV.  Patient taking differently: Take 12.5 mg by mouth 2 (two) times daily with a meal.   [place], Peter M, MD Taking Active Self, Pharmacy Records  diclofenac Sodium (VOLTAREN ARTHRITIS PAIN) 1 % GEL [id number] No Apply topically as needed.  Patient not taking: Reported on 12/19/2023   [provider] Not Taking Active Self, Pharmacy Records  Dulaglutide 3 MG/0.5ML SOAJ [id number] Yes Inject 3 mg into the skin every 7 (seven) days. Tuesdays [provider] Taking Active Self, Pharmacy Records  gabapentin (NEURONTIN) 300 MG capsule [id number] Yes Take 300 mg by mouth at bedtime as needed (pain). [provider] Taking Active Self, Pharmacy Records  irbesartan (AVAPRO) 150 MG tablet [id number] Yes Take 150 mg by mouth daily. [provider] Taking Active Self, Pharmacy Records              Assessment/Plan:   {Pharmacy A/P Choices:26421}  Follow Up Plan: ***  ***

## 2023-12-19 NOTE — Progress Notes (Signed)
 12/19/2023 Name: Lori Bennett MRN: [id number] DOB: [birth year]  Chief Complaint  Patient presents with   Medication Management    Lori Bennett is a 68 y.o. year old female who presented for a telephone visit.   They were referred to the pharmacist by their PCP for assistance in managing diabetes.    Subjective:  Care Team: Primary Care Provider: Melida Quitter, MD  Medication Access/Adherence  Current Pharmacy:  [facility] [address] [place]), [place] - [address] [place] ([place]) [place] [address] Phone: [phone] Fax: [phone]  [facility] [address] [place] [address] Phone: [phone] Fax: [phone]   Patient reports affordability concerns with their medications: Yes  Patient reports access/transportation concerns to their pharmacy: No  Patient reports adherence concerns with their medications:  No     Diabetes:  Current medications:  Medications tried in the past: Metformin (GI upset), Jardiance (severe UTI)  Current glucose readings: 139-170 mg/dl self reported  Patient denies hypoglycemic s/sx including dizziness, shakiness, sweating. Patient denies hyperglycemic symptoms including polyuria, polydipsia, polyphagia, nocturia, neuropathy, blurred vision.   Current physical activity: walking for exercise  Current medication access support: Currently receiving Trulicity 1.5mg  from [employer]. After discussion with PCP, we will increase to Trulicity 3mg  weekly. Application mailed to patient today and she will return to office once completed.    Objective:  Lab Results  Component Value Date   HGBA1C 7.2 (H) 10/03/2023    Lab Results  Component Value Date   CREATININE 0.77 10/05/2023   BUN 9 10/05/2023   NA 140 10/05/2023   K 3.4 (L) 10/05/2023   CL 105 10/05/2023   CO2 24 10/05/2023    Lab Results  Component Value Date   CHOL 157 05/18/2017   HDL 41 05/18/2017    LDLCALC 81 05/18/2017   TRIG 173 (H) 05/18/2017   CHOLHDL 3.8 05/18/2017    Medications Reviewed Today     Reviewed by Roe Coombs, [facility] (Pharmacist) on 12/19/23 at 1005  Med List Status: <None>   Medication Order Taking? Sig Documenting Provider Last Dose Status Informant  acetaminophen (TYLENOL) 500 MG tablet [id number] Yes Take 2 tablets (1,000 mg total) by mouth every 6 (six) hours as needed for mild pain or moderate pain. Jenne Pane, PA-C Taking Active Self, Pharmacy Records  amLODipine (NORVASC) 5 MG tablet [id number] Yes Take 1 tablet (5 mg total) by mouth daily. Elease Etienne, MD Taking Active   atorvastatin (LIPITOR) 20 MG tablet [id number] Yes Take 20 mg by mouth daily. [provider] Taking Active Self, Pharmacy Records  carvedilol (COREG) 12.5 MG tablet [id number] Yes Take 1 tablet (12.5 mg total) by mouth 2 (two) times daily with a meal. NEED OV.  Patient taking differently: Take 12.5 mg by mouth 2 (two) times daily with a meal.   [place], Peter M, MD Taking Active Self, Pharmacy Records  diclofenac Sodium (VOLTAREN ARTHRITIS PAIN) 1 % GEL [id number] No Apply topically as needed.  Patient not taking: Reported on 12/19/2023   [provider] Not Taking Active Self, Pharmacy Records  Dulaglutide 3 MG/0.5ML SOAJ [id number] Yes Inject 3 mg into the skin every 7 (seven) days. Tuesdays [provider] Taking Active Self, Pharmacy Records  gabapentin (NEURONTIN) 300 MG capsule [id number] Yes Take 300 mg by mouth at bedtime as needed (pain). [provider] Taking Active Self, Pharmacy Records  irbesartan (AVAPRO) 150 MG tablet [id number] Yes Take 150 mg  by mouth daily. [provider] Taking Active Self, Pharmacy Records              Assessment/Plan:   Diabetes: - Currently uncontrolled (last A1C 7.4 on 11/02/2023) - Meets financial criteria for Trulicity patient assistance program through [employer]. Will collaborate with  provider and patient to pursue assistance. Application mailed to patient today, she is aware to complete and return to office. We will increase the Trulicity dose to 3mg  weekly and collaborate with [employer] to get this mailed to her as soon as possible.    Follow Up Plan: Next appointment with PCP on 01/25/2024. I will follow up next week to check on status of application with [employer].

## 2023-12-27 DIAGNOSIS — R102 Pelvic and perineal pain: Secondary | ICD-10-CM | POA: Diagnosis not present

## 2024-01-25 DIAGNOSIS — E785 Hyperlipidemia, unspecified: Secondary | ICD-10-CM | POA: Diagnosis not present

## 2024-01-25 DIAGNOSIS — I110 Hypertensive heart disease with heart failure: Secondary | ICD-10-CM | POA: Diagnosis not present

## 2024-01-25 DIAGNOSIS — E1142 Type 2 diabetes mellitus with diabetic polyneuropathy: Secondary | ICD-10-CM | POA: Diagnosis not present

## 2024-01-29 DIAGNOSIS — E119 Type 2 diabetes mellitus without complications: Secondary | ICD-10-CM | POA: Diagnosis not present

## 2024-01-29 DIAGNOSIS — H43813 Vitreous degeneration, bilateral: Secondary | ICD-10-CM | POA: Diagnosis not present

## 2024-03-07 DIAGNOSIS — M549 Dorsalgia, unspecified: Secondary | ICD-10-CM | POA: Diagnosis not present

## 2024-03-07 DIAGNOSIS — I110 Hypertensive heart disease with heart failure: Secondary | ICD-10-CM | POA: Diagnosis not present

## 2024-03-07 DIAGNOSIS — I5032 Chronic diastolic (congestive) heart failure: Secondary | ICD-10-CM | POA: Diagnosis not present

## 2024-03-07 DIAGNOSIS — E1142 Type 2 diabetes mellitus with diabetic polyneuropathy: Secondary | ICD-10-CM | POA: Diagnosis not present

## 2024-03-07 DIAGNOSIS — E785 Hyperlipidemia, unspecified: Secondary | ICD-10-CM | POA: Diagnosis not present

## 2024-03-07 DIAGNOSIS — Z713 Dietary counseling and surveillance: Secondary | ICD-10-CM | POA: Diagnosis not present

## 2024-03-07 DIAGNOSIS — I517 Cardiomegaly: Secondary | ICD-10-CM | POA: Diagnosis not present

## 2024-03-07 DIAGNOSIS — G63 Polyneuropathy in diseases classified elsewhere: Secondary | ICD-10-CM | POA: Diagnosis not present

## 2024-03-11 ENCOUNTER — Other Ambulatory Visit (HOSPITAL_COMMUNITY): Payer: Self-pay | Admitting: Internal Medicine

## 2024-03-11 DIAGNOSIS — I5032 Chronic diastolic (congestive) heart failure: Secondary | ICD-10-CM

## 2024-04-17 ENCOUNTER — Ambulatory Visit (HOSPITAL_COMMUNITY)
Admission: RE | Admit: 2024-04-17 | Discharge: 2024-04-17 | Disposition: A | Discharge status: Home / Self Care | Payer: PRIVATE HEALTH INSURANCE | Source: Ambulatory Visit | Attending: Cardiology | Admitting: Cardiology

## 2024-04-17 DIAGNOSIS — I5032 Chronic diastolic (congestive) heart failure: Secondary | ICD-10-CM | POA: Diagnosis not present

## 2024-04-17 LAB — ECHOCARDIOGRAM COMPLETE
Area-P 1/2: 3.58 cm2
S' Lateral: 3.1 cm

## 2024-07-30 ENCOUNTER — Other Ambulatory Visit: Payer: Self-pay | Admitting: Internal Medicine

## 2024-07-30 DIAGNOSIS — Z1231 Encounter for screening mammogram for malignant neoplasm of breast: Secondary | ICD-10-CM

## 2024-08-27 ENCOUNTER — Ambulatory Visit
Admission: RE | Admit: 2024-08-27 | Discharge: 2024-08-27 | Disposition: A | Discharge status: Home / Self Care | Payer: PRIVATE HEALTH INSURANCE | Source: Ambulatory Visit | Attending: Internal Medicine | Admitting: Internal Medicine

## 2024-08-27 DIAGNOSIS — Z1231 Encounter for screening mammogram for malignant neoplasm of breast: Secondary | ICD-10-CM

## 2024-08-30 ENCOUNTER — Other Ambulatory Visit: Payer: Self-pay | Admitting: Internal Medicine

## 2024-08-30 DIAGNOSIS — R928 Other abnormal and inconclusive findings on diagnostic imaging of breast: Secondary | ICD-10-CM

## 2024-09-09 ENCOUNTER — Other Ambulatory Visit (HOSPITAL_COMMUNITY): Payer: Self-pay | Admitting: Internal Medicine

## 2024-09-09 ENCOUNTER — Ambulatory Visit (HOSPITAL_COMMUNITY)
Admission: RE | Admit: 2024-09-09 | Discharge: 2024-09-09 | Disposition: A | Discharge status: Home / Self Care | Source: Ambulatory Visit | Attending: Surgery | Admitting: Surgery

## 2024-09-09 DIAGNOSIS — I739 Peripheral vascular disease, unspecified: Secondary | ICD-10-CM | POA: Diagnosis not present

## 2024-09-09 LAB — VAS US ABI WITH/WO TBI
Left ABI: 1.17
Right ABI: 1.17

## 2024-09-10 ENCOUNTER — Ambulatory Visit
Admission: RE | Admit: 2024-09-10 | Discharge: 2024-09-10 | Disposition: A | Discharge status: Home / Self Care | Source: Ambulatory Visit | Attending: Internal Medicine | Admitting: Internal Medicine

## 2024-09-10 DIAGNOSIS — R928 Other abnormal and inconclusive findings on diagnostic imaging of breast: Secondary | ICD-10-CM

## 2024-09-10 DIAGNOSIS — N6042 Mammary duct ectasia of left breast: Secondary | ICD-10-CM | POA: Diagnosis not present

## 2025-02-25 ENCOUNTER — Other Ambulatory Visit
